# Patient Record
Sex: Female | Born: 1980
Health system: Southern US, Community
[De-identification: ages and names within clinical notes are randomized; demographics above are authoritative.]

## PROBLEM LIST (undated history)

## (undated) DIAGNOSIS — D649 Anemia, unspecified: Secondary | ICD-10-CM

## (undated) DIAGNOSIS — K9041 Non-celiac gluten sensitivity: Secondary | ICD-10-CM

## (undated) DIAGNOSIS — B009 Herpesviral infection, unspecified: Secondary | ICD-10-CM

## (undated) DIAGNOSIS — F319 Bipolar disorder, unspecified: Secondary | ICD-10-CM

## (undated) DIAGNOSIS — L209 Atopic dermatitis, unspecified: Secondary | ICD-10-CM

## (undated) DIAGNOSIS — Z91018 Allergy to other foods: Secondary | ICD-10-CM

## (undated) DIAGNOSIS — J309 Allergic rhinitis, unspecified: Secondary | ICD-10-CM

## (undated) DIAGNOSIS — F419 Anxiety disorder, unspecified: Secondary | ICD-10-CM

## (undated) DIAGNOSIS — J45909 Unspecified asthma, uncomplicated: Secondary | ICD-10-CM

## (undated) DIAGNOSIS — L309 Dermatitis, unspecified: Secondary | ICD-10-CM

## (undated) DIAGNOSIS — N289 Disorder of kidney and ureter, unspecified: Secondary | ICD-10-CM

## (undated) HISTORY — DX: Unspecified asthma, uncomplicated: J45.909

## (undated) HISTORY — DX: Allergic rhinitis, unspecified: J30.9

## (undated) HISTORY — DX: Atopic dermatitis, unspecified: L20.9

## (undated) HISTORY — DX: Allergy to other foods: Z91.018

## (undated) HISTORY — DX: Non-celiac gluten sensitivity: K90.41

## (undated) HISTORY — DX: Dermatitis, unspecified: L30.9

## (undated) HISTORY — DX: Herpesviral infection, unspecified: B00.9

## (undated) HISTORY — DX: Anemia, unspecified: D64.9

## (undated) HISTORY — PX: WISDOM TOOTH EXTRACTION: SHX21

---
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
## 2000-05-03 ENCOUNTER — Ambulatory Visit (HOSPITAL_COMMUNITY): Admission: RE | Admit: 2000-05-03 | Discharge: 2000-05-03 | Payer: Self-pay | Admitting: Obstetrics

## 2000-07-20 ENCOUNTER — Inpatient Hospital Stay (HOSPITAL_COMMUNITY): Admission: AD | Admit: 2000-07-20 | Discharge: 2000-07-20 | Payer: Self-pay | Admitting: *Deleted

## 2000-09-14 ENCOUNTER — Inpatient Hospital Stay (HOSPITAL_COMMUNITY): Admission: AD | Admit: 2000-09-14 | Discharge: 2000-09-16 | Payer: Self-pay | Admitting: *Deleted

## 2000-09-23 ENCOUNTER — Inpatient Hospital Stay (HOSPITAL_COMMUNITY): Admission: AD | Admit: 2000-09-23 | Discharge: 2000-09-24 | Payer: Self-pay | Admitting: *Deleted

## 2000-10-29 ENCOUNTER — Other Ambulatory Visit: Admission: RE | Admit: 2000-10-29 | Discharge: 2000-10-29 | Payer: Self-pay | Admitting: *Deleted

## 2001-07-24 ENCOUNTER — Encounter: Admission: RE | Admit: 2001-07-24 | Discharge: 2001-07-24 | Payer: Self-pay | Admitting: Family Medicine

## 2001-07-24 ENCOUNTER — Encounter: Payer: Self-pay | Admitting: Family Medicine

## 2001-11-20 ENCOUNTER — Other Ambulatory Visit: Admission: RE | Admit: 2001-11-20 | Discharge: 2001-11-20 | Payer: Self-pay | Admitting: Family Medicine

## 2002-08-22 ENCOUNTER — Emergency Department (HOSPITAL_COMMUNITY): Admission: EM | Admit: 2002-08-22 | Discharge: 2002-08-22 | Payer: Self-pay | Admitting: Emergency Medicine

## 2003-01-05 ENCOUNTER — Encounter: Payer: Self-pay | Admitting: Family Medicine

## 2003-01-05 ENCOUNTER — Encounter: Admission: RE | Admit: 2003-01-05 | Discharge: 2003-01-05 | Payer: Self-pay | Admitting: Family Medicine

## 2003-01-22 ENCOUNTER — Other Ambulatory Visit: Admission: RE | Admit: 2003-01-22 | Discharge: 2003-01-22 | Payer: Self-pay | Admitting: Family Medicine

## 2003-01-27 ENCOUNTER — Emergency Department (HOSPITAL_COMMUNITY): Admission: EM | Admit: 2003-01-27 | Discharge: 2003-01-28 | Payer: Self-pay | Admitting: Emergency Medicine

## 2003-03-02 ENCOUNTER — Ambulatory Visit (HOSPITAL_COMMUNITY): Admission: RE | Admit: 2003-03-02 | Discharge: 2003-03-02 | Payer: Self-pay | Admitting: Internal Medicine

## 2004-02-13 ENCOUNTER — Emergency Department (HOSPITAL_COMMUNITY): Admission: EM | Admit: 2004-02-13 | Discharge: 2004-02-13 | Payer: Self-pay | Admitting: Emergency Medicine

## 2004-06-15 ENCOUNTER — Emergency Department (HOSPITAL_COMMUNITY): Admission: EM | Admit: 2004-06-15 | Discharge: 2004-06-15 | Payer: Self-pay | Admitting: Emergency Medicine

## 2004-10-04 ENCOUNTER — Encounter: Admission: RE | Admit: 2004-10-04 | Discharge: 2004-10-04 | Payer: Self-pay | Admitting: Internal Medicine

## 2005-02-17 ENCOUNTER — Inpatient Hospital Stay (HOSPITAL_COMMUNITY): Admission: RE | Admit: 2005-02-17 | Discharge: 2005-02-23 | Payer: Self-pay | Admitting: Psychiatry

## 2005-02-18 ENCOUNTER — Ambulatory Visit: Payer: Self-pay | Admitting: Psychiatry

## 2005-03-21 ENCOUNTER — Emergency Department (HOSPITAL_COMMUNITY): Admission: EM | Admit: 2005-03-21 | Discharge: 2005-03-21 | Payer: Self-pay | Admitting: Emergency Medicine

## 2005-05-29 ENCOUNTER — Emergency Department (HOSPITAL_COMMUNITY): Admission: EM | Admit: 2005-05-29 | Discharge: 2005-05-29 | Payer: Self-pay | Admitting: Family Medicine

## 2005-07-19 ENCOUNTER — Emergency Department (HOSPITAL_COMMUNITY): Admission: EM | Admit: 2005-07-19 | Discharge: 2005-07-19 | Payer: Self-pay | Admitting: Emergency Medicine

## 2005-08-22 ENCOUNTER — Emergency Department (HOSPITAL_COMMUNITY): Admission: EM | Admit: 2005-08-22 | Discharge: 2005-08-22 | Payer: Self-pay | Admitting: Family Medicine

## 2006-10-28 ENCOUNTER — Emergency Department (HOSPITAL_COMMUNITY): Admission: EM | Admit: 2006-10-28 | Discharge: 2006-10-28 | Payer: Self-pay | Admitting: Emergency Medicine

## 2007-03-01 ENCOUNTER — Emergency Department (HOSPITAL_COMMUNITY): Admission: EM | Admit: 2007-03-01 | Discharge: 2007-03-01 | Payer: Self-pay | Admitting: Emergency Medicine

## 2007-04-17 ENCOUNTER — Emergency Department (HOSPITAL_COMMUNITY): Admission: EM | Admit: 2007-04-17 | Discharge: 2007-04-17 | Payer: Self-pay | Admitting: Family Medicine

## 2007-05-01 ENCOUNTER — Emergency Department (HOSPITAL_COMMUNITY): Admission: EM | Admit: 2007-05-01 | Discharge: 2007-05-01 | Payer: Self-pay | Admitting: Family Medicine

## 2008-01-02 ENCOUNTER — Emergency Department (HOSPITAL_COMMUNITY): Admission: EM | Admit: 2008-01-02 | Discharge: 2008-01-02 | Payer: Self-pay | Admitting: Family Medicine
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
## 2010-12-09 LAB — POCT RAPID STREP A: Streptococcus, Group A Screen (Direct): POSITIVE — AB

## 2011-12-12 ENCOUNTER — Encounter: Payer: 59 | Admitting: Obstetrics and Gynecology

## 2011-12-13 ENCOUNTER — Ambulatory Visit (INDEPENDENT_AMBULATORY_CARE_PROVIDER_SITE_OTHER): Payer: 59 | Admitting: Obstetrics and Gynecology

## 2011-12-13 ENCOUNTER — Encounter: Payer: Self-pay | Admitting: Obstetrics and Gynecology

## 2011-12-13 VITALS — BP 118/72 | HR 82 | Ht 66.5 in | Wt 161.0 lb

## 2011-12-13 DIAGNOSIS — Z113 Encounter for screening for infections with a predominantly sexual mode of transmission: Secondary | ICD-10-CM

## 2011-12-13 DIAGNOSIS — Z124 Encounter for screening for malignant neoplasm of cervix: Secondary | ICD-10-CM

## 2011-12-13 DIAGNOSIS — N9489 Other specified conditions associated with female genital organs and menstrual cycle: Secondary | ICD-10-CM

## 2011-12-13 DIAGNOSIS — N9089 Other specified noninflammatory disorders of vulva and perineum: Secondary | ICD-10-CM

## 2011-12-13 DIAGNOSIS — Z01419 Encounter for gynecological examination (general) (routine) without abnormal findings: Secondary | ICD-10-CM

## 2011-12-13 MED ORDER — VALACYCLOVIR HCL 500 MG PO TABS: ORAL_TABLET | ORAL | Status: DC

## 2011-12-13 MED ORDER — IMIQUIMOD 5 % EX CREA: TOPICAL_CREAM | CUTANEOUS | Status: DC

## 2011-12-13 NOTE — Progress Notes (Signed)
Regular Periods: yes Mammogram: no  Monthly Breast Ex.: yes Exercise: yes  Tetanus < 10 years: no Seatbelts: yes  NI. Bladder Functn.: yes Abuse at home: no  Daily BM's: yes Stressful Work: yes  Healthy Diet: yes Sigmoid-Colonoscopy: NO  Calcium: no Medical problems this year: NO PROBLEMS   LAST PAP:2012  NL  Contraception: MIRENA IUD  Mammogram:  NO  PCP: NO  PMH: NO CHANGE  FMH: NO CHANGE  Last Bone Scan: NO    PT IS SINGLE.

## 2011-12-13 NOTE — Progress Notes (Signed)
Subjective:    Candace Browning is a 31 y.o. female, G1P0, who presents for an annual exam. The patient has not complaints.  Had Mirena IUD inserted July 2012.  Menstrual cycle:   LMP: Patient's last menstrual period was 11/27/2011.             Review of Systems Pertinent items are noted in HPI. Denies pelvic pain, urinary tract symptoms, vaginitis symptoms, irregular bleeding, menopausal symptoms, change in bowel habits or rectal bleeding   Objective:    BP 118/72  Pulse 82  Ht 5' 6.5" (1.689 m)  Wt 161 lb (73.029 kg)  BMI 25.60 kg/m2  LMP 11/27/2011    Wt Readings from Last 1 Encounters:  12/13/11 161 lb (73.029 kg)   Body mass index is 25.60 kg/(m^2). General Appearance: Alert, no acute distress HEENT: Grossly normal Neck / Thyroid: Supple, no thyromegaly or cervical adenopathy Lungs: Clear to auscultation bilaterally Back: No CVA tenderness Breast Exam: No masses or nodes.No dimpling, nipple retraction or discharge. Cardiovascular: Regular rate and rhythm.  Gastrointestinal: Soft, non-tender, no masses or organomegaly Pelvic Exam: EGBUS-wnl except at inferior vaginal introitus a linear condylomatous lesion, vagina-normal rugae, cervix- without lesions or tenderness, string visible uterus appears normal size shape and consistency, adnexae-no masses or tenderness Lymphatic Exam: Non-palpable nodes in neck, clavicular,  axillary, or inguinal regions  Skin: no rashes or abnormalities Extremities: no clubbing cyanosis or edema  Neurologic: grossly normal Psychiatric: Alert and oriented   Assessment:   Routine GYN Exam Asymptomatic Condylomatous Lesion   Plan:    PAP sent  STD testing  Offered biopsy of perineal lesion ? condyloma, patient prefers a trial of Aldara and if not gone or significantly decreased in 4 weeks will schedule a biopsy  Aldara Cream #4 apply as directed to affected area  qhs qod x3 weekly up to 12 weeks 3 refills  Reviewed HPV both high risk  and low risk RTO 1 year or prn  [name],ELMIRAPA-C

## 2011-12-13 NOTE — Patient Instructions (Signed)
Genital Warts Genital warts are a sexually transmitted infection. They may appear as small bumps on the tissues of the genital area. CAUSES  Genital warts are caused by a virus called human papillomavirus (HPV). HPV is the most common sexually transmitted disease (STD) and infection of the sex organs. This infection is spread by having unprotected sex with an infected person. It can be spread by vaginal, anal, and oral sex. Many people do not know they are infected. They may be infected for years without problems. However, even if they do not have problems, they can unknowingly pass the infection to their sexual partners. SYMPTOMS   Itching and irritation in the genital area.   Warts that bleed.   Painful sexual intercourse.  DIAGNOSIS  Warts are usually recognized with the naked eye on the vagina, vulva, perineum, anus, and rectum. Certain tests can also diagnose genital warts, such as:  A Pap test.   A tissue sample (biopsy) exam.   Colposcopy. A magnifying tool is used to examine the vagina and cervix. The HPV cells will change color when certain solutions are used.  TREATMENT  Warts can be removed by:  Applying certain chemicals, such as cantharidin or podophyllin.   Liquid nitrogen freezing (cryotherapy).   Immunotherapy with candida or trichophyton injections.   Laser treatment.   Burning with an electrified probe (electrocautery).   Interferon injections.   Surgery.  PREVENTION  HPV vaccination can help prevent HPV infections that cause genital warts and that cause cancer of the cervix. It is recommended that the vaccination be given to people between the ages 9 to 26 years old. The vaccine might not work as well or might not work at all if you already have HPV. It should not be given to pregnant women. HOME CARE INSTRUCTIONS   It is important to follow your caregiver's instructions. The warts will not go away without treatment. Repeat treatments are often needed to get  rid of warts. Even after it appears that the warts are gone, the normal tissue underneath often remains infected.   Do not try to treat genital warts with medicine used to treat hand warts. This type of medicine is strong and can burn the skin in the genital area, causing more damage.   Tell your past and current sexual partner(s) that you have genital warts. They may be infected also and need treatment.   Avoid sexual contact while being treated.   Do not touch or scratch the warts. The infection may spread to other parts of your body.   Women with genital warts should have a cervical cancer check (Pap test) at least once a year. This type of cancer is slow-growing and can be cured if found early. Chances of developing cervical cancer are increased with HPV.   Inform your obstetrician about your warts in the event of pregnancy. This virus can be passed to the baby's respiratory tract. Discuss this with your caregiver.   Use a condom during sexual intercourse. Following treatment, the use of condoms will help prevent reinfection.   Ask your caregiver about using over-the-counter anti-itch creams.  SEEK MEDICAL CARE IF:   Your treated skin becomes red, swollen, or painful.   You have a fever.   You feel generally ill.   You feel little lumps in and around your genital area.   You are bleeding or have painful sexual intercourse.  MAKE SURE YOU:   Understand these instructions.   Will watch your condition.   Will   get help right away if you are not doing well or get worse.  Document Released: 03/03/2000 Document Revised: 02/23/2011 Document Reviewed: 09/12/2010 [employer] Patient Information 2012 [employer]. 

## 2011-12-14 LAB — HEPATITIS C ANTIBODY: HCV Ab: NEGATIVE

## 2011-12-14 LAB — HEPATITIS B SURFACE ANTIGEN: Hepatitis B Surface Ag: NEGATIVE

## 2011-12-14 LAB — RPR

## 2011-12-14 LAB — HIV ANTIBODY (ROUTINE TESTING W REFLEX): HIV: NONREACTIVE

## 2011-12-18 LAB — PAP IG, CT-NG, RFX HPV ASCU

## 2011-12-19 LAB — HUMAN PAPILLOMAVIRUS, HIGH RISK: HPV DNA High Risk: NOT DETECTED

## 2011-12-20 ENCOUNTER — Encounter: Payer: Self-pay | Admitting: Obstetrics and Gynecology

## 2013-08-03 ENCOUNTER — Emergency Department (HOSPITAL_COMMUNITY)
Admission: EM | Admit: 2013-08-03 | Discharge: 2013-08-03 | Disposition: A | Discharge status: Home / Self Care | Payer: BC Managed Care – PPO | Attending: Emergency Medicine | Admitting: Emergency Medicine

## 2013-08-03 ENCOUNTER — Emergency Department (HOSPITAL_COMMUNITY): Payer: BC Managed Care – PPO

## 2013-08-03 ENCOUNTER — Encounter (HOSPITAL_COMMUNITY): Payer: Self-pay | Admitting: Emergency Medicine

## 2013-08-03 DIAGNOSIS — S40019A Contusion of unspecified shoulder, initial encounter: Secondary | ICD-10-CM

## 2013-08-03 DIAGNOSIS — S0990XA Unspecified injury of head, initial encounter: Secondary | ICD-10-CM | POA: Insufficient documentation

## 2013-08-03 DIAGNOSIS — Z872 Personal history of diseases of the skin and subcutaneous tissue: Secondary | ICD-10-CM | POA: Insufficient documentation

## 2013-08-03 DIAGNOSIS — Z8619 Personal history of other infectious and parasitic diseases: Secondary | ICD-10-CM | POA: Diagnosis not present

## 2013-08-03 DIAGNOSIS — S5000XA Contusion of unspecified elbow, initial encounter: Secondary | ICD-10-CM | POA: Diagnosis not present

## 2013-08-03 DIAGNOSIS — S0993XA Unspecified injury of face, initial encounter: Secondary | ICD-10-CM | POA: Diagnosis present

## 2013-08-03 DIAGNOSIS — IMO0002 Reserved for concepts with insufficient information to code with codable children: Secondary | ICD-10-CM | POA: Diagnosis not present

## 2013-08-03 DIAGNOSIS — S60219A Contusion of unspecified wrist, initial encounter: Secondary | ICD-10-CM | POA: Diagnosis not present

## 2013-08-03 DIAGNOSIS — Z23 Encounter for immunization: Secondary | ICD-10-CM | POA: Insufficient documentation

## 2013-08-03 DIAGNOSIS — Y92410 Unspecified street and highway as the place of occurrence of the external cause: Secondary | ICD-10-CM | POA: Diagnosis not present

## 2013-08-03 DIAGNOSIS — S058X9A Other injuries of unspecified eye and orbit, initial encounter: Secondary | ICD-10-CM | POA: Diagnosis not present

## 2013-08-03 DIAGNOSIS — S60211A Contusion of right wrist, initial encounter: Secondary | ICD-10-CM

## 2013-08-03 DIAGNOSIS — J45909 Unspecified asthma, uncomplicated: Secondary | ICD-10-CM | POA: Insufficient documentation

## 2013-08-03 DIAGNOSIS — S069X9A Unspecified intracranial injury with loss of consciousness of unspecified duration, initial encounter: Secondary | ICD-10-CM

## 2013-08-03 DIAGNOSIS — Y9389 Activity, other specified: Secondary | ICD-10-CM | POA: Diagnosis not present

## 2013-08-03 DIAGNOSIS — Z79899 Other long term (current) drug therapy: Secondary | ICD-10-CM | POA: Diagnosis not present

## 2013-08-03 DIAGNOSIS — S5001XA Contusion of right elbow, initial encounter: Secondary | ICD-10-CM

## 2013-08-03 DIAGNOSIS — S139XXA Sprain of joints and ligaments of unspecified parts of neck, initial encounter: Secondary | ICD-10-CM | POA: Insufficient documentation

## 2013-08-03 DIAGNOSIS — Z880 Allergy status to penicillin: Secondary | ICD-10-CM | POA: Diagnosis not present

## 2013-08-03 DIAGNOSIS — S161XXA Strain of muscle, fascia and tendon at neck level, initial encounter: Secondary | ICD-10-CM

## 2013-08-03 DIAGNOSIS — S199XXA Unspecified injury of neck, initial encounter: Secondary | ICD-10-CM | POA: Diagnosis present

## 2013-08-03 DIAGNOSIS — Z862 Personal history of diseases of the blood and blood-forming organs and certain disorders involving the immune mechanism: Secondary | ICD-10-CM | POA: Diagnosis not present

## 2013-08-03 DIAGNOSIS — S0501XA Injury of conjunctiva and corneal abrasion without foreign body, right eye, initial encounter: Secondary | ICD-10-CM

## 2013-08-03 DIAGNOSIS — S069XAA Unspecified intracranial injury with loss of consciousness status unknown, initial encounter: Secondary | ICD-10-CM

## 2013-08-03 MED ORDER — TETRACAINE HCL 0.5 % OP SOLN
1.0000 [drp] | Freq: Once | OPHTHALMIC | Status: AC
  Administered 2013-08-03: 1 [drp] via OPHTHALMIC
  Filled 2013-08-03: qty 2

## 2013-08-03 MED ORDER — TETANUS-DIPHTH-ACELL PERTUSSIS 5-2.5-18.5 LF-MCG/0.5 IM SUSP
0.5000 mL | Freq: Once | INTRAMUSCULAR | Status: AC
  Administered 2013-08-03: 0.5 mL via INTRAMUSCULAR
  Filled 2013-08-03: qty 0.5

## 2013-08-03 MED ORDER — MORPHINE SULFATE 4 MG/ML IJ SOLN
4.0000 mg | Freq: Once | INTRAMUSCULAR | Status: AC
  Administered 2013-08-03: 4 mg via INTRAVENOUS
  Filled 2013-08-03: qty 1

## 2013-08-03 MED ORDER — IBUPROFEN 800 MG PO TABS: 800.0000 mg | ORAL_TABLET | Freq: Three times a day (TID) | ORAL | Status: DC

## 2013-08-03 MED ORDER — GATIFLOXACIN 0.5 % OP SOLN
1.0000 [drp] | Freq: Four times a day (QID) | OPHTHALMIC | Status: DC
  Administered 2013-08-03: 1 [drp] via OPHTHALMIC
  Filled 2013-08-03: qty 2.5

## 2013-08-03 MED ORDER — OXYCODONE-ACETAMINOPHEN 5-325 MG PO TABS: 1.0000 | ORAL_TABLET | ORAL | Status: DC | PRN

## 2013-08-03 MED ORDER — CYCLOBENZAPRINE HCL 10 MG PO TABS: 10.0000 mg | ORAL_TABLET | Freq: Two times a day (BID) | ORAL | Status: DC | PRN

## 2013-08-03 MED ORDER — FLUORESCEIN SODIUM 1 MG OP STRP
1.0000 | ORAL_STRIP | Freq: Once | OPHTHALMIC | Status: AC
  Administered 2013-08-03: 1 via OPHTHALMIC
  Filled 2013-08-03: qty 1

## 2013-08-03 MED ORDER — ONDANSETRON HCL 4 MG/2ML IJ SOLN: 4.0000 mg | INTRAMUSCULAR | Status: DC | PRN

## 2013-08-03 NOTE — ED Notes (Signed)
Patient returned from X-ray 

## 2013-08-03 NOTE — ED Notes (Signed)
Patient transported to X-ray 

## 2013-08-03 NOTE — Progress Notes (Signed)
Orthopedic Tech Progress Note Patient Details:  Candace Browning 04-10-1980 [id number]  Ortho Devices Type of Ortho Device: Arm sling;Thumb velcro splint Ortho Device/Splint Location: RUE Ortho Device/Splint Interventions: Ordered   Braulio Bosch 08/03/2013, 3:55 PM

## 2013-08-03 NOTE — ED Notes (Signed)
Ortho tech paged  
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
## 2013-08-03 NOTE — Discharge Instructions (Signed)
Call optometrist tomorrow for further evaluation of your right eye redness, possible corneal abrasion, or foreign body. Do not wear your contacts until you are cleared to do so by an eye specialist. Call for a follow up appointment with a Family or Primary Care Provider.  Call an orthopedic specialist for further evaluation of your right elbow injury, right wrist injury, right shoulder injury. Continue to wear the thumb spica brace, and sling until you're released by orthopedic specialist. Return if Symptoms worsen.   Take medication as prescribed.  Ice your neck, shoulder, elbow, wrist 3-4 times a day.

## 2013-08-06 NOTE — ED Provider Notes (Signed)
Medical screening examination/treatment/procedure(s) were performed by non-physician practitioner and as supervising physician I was immediately available for consultation/collaboration.   EKG Interpretation None        Orpah Greek, MD 08/06/13 [phone]

## 2014-01-19 ENCOUNTER — Encounter (HOSPITAL_COMMUNITY): Payer: Self-pay | Admitting: Emergency Medicine

## 2014-11-27 ENCOUNTER — Ambulatory Visit: Payer: BLUE CROSS/BLUE SHIELD | Admitting: Podiatry

## 2014-12-15 ENCOUNTER — Ambulatory Visit (INDEPENDENT_AMBULATORY_CARE_PROVIDER_SITE_OTHER): Payer: BLUE CROSS/BLUE SHIELD

## 2014-12-15 DIAGNOSIS — J309 Allergic rhinitis, unspecified: Secondary | ICD-10-CM

## 2014-12-29 ENCOUNTER — Ambulatory Visit (INDEPENDENT_AMBULATORY_CARE_PROVIDER_SITE_OTHER): Payer: BLUE CROSS/BLUE SHIELD | Admitting: *Deleted

## 2014-12-29 DIAGNOSIS — J309 Allergic rhinitis, unspecified: Secondary | ICD-10-CM

## 2015-01-11 ENCOUNTER — Encounter: Payer: Self-pay | Admitting: Allergy and Immunology

## 2015-01-11 ENCOUNTER — Ambulatory Visit (INDEPENDENT_AMBULATORY_CARE_PROVIDER_SITE_OTHER): Payer: BLUE CROSS/BLUE SHIELD | Admitting: Allergy and Immunology

## 2015-01-11 VITALS — BP 104/70 | HR 80 | Temp 98.4°F | Resp 16 | Ht 67.0 in | Wt 130.0 lb

## 2015-01-11 DIAGNOSIS — T7800XA Anaphylactic reaction due to unspecified food, initial encounter: Secondary | ICD-10-CM | POA: Insufficient documentation

## 2015-01-11 DIAGNOSIS — J3089 Other allergic rhinitis: Secondary | ICD-10-CM | POA: Diagnosis not present

## 2015-01-11 DIAGNOSIS — L209 Atopic dermatitis, unspecified: Secondary | ICD-10-CM | POA: Diagnosis not present

## 2015-01-11 DIAGNOSIS — J4530 Mild persistent asthma, uncomplicated: Secondary | ICD-10-CM | POA: Diagnosis not present

## 2015-01-11 DIAGNOSIS — J4520 Mild intermittent asthma, uncomplicated: Secondary | ICD-10-CM | POA: Insufficient documentation

## 2015-01-11 DIAGNOSIS — T7800XD Anaphylactic reaction due to unspecified food, subsequent encounter: Secondary | ICD-10-CM

## 2015-01-11 NOTE — Assessment & Plan Note (Signed)
   Continue albuterol HFA, 1-2 inhalations every 4-6 hours as needed and 15 minutes prior to vigorous exercise.  Subjective and objective measures of pulmonary function will be followed and the treatment plan will be adjusted accordingly. 

## 2015-01-11 NOTE — Assessment & Plan Note (Signed)
   Continue meticulous avoidance of peanuts and tree nuts and have access to epinephrine autoinjector 2 pack.

## 2015-01-11 NOTE — Assessment & Plan Note (Addendum)
Improved.  Continue appropriate skin care, Aquaphor as needed, and Lidex sparingly to affected areas as needed.

## 2015-01-11 NOTE — Patient Instructions (Signed)
Mild persistent asthma  Continue albuterol HFA, 1-2 inhalations every 4-6 hours as needed and 15 minutes prior to vigorous exercise.  Subjective and objective measures of pulmonary function will be followed and the treatment plan will be adjusted accordingly.  Allergic rhinitis Stable.  Continue aeroallergen immunotherapy as prescribed, levocetirizine as needed and fluticasone nasal spray as needed.  Food allergy  Continue meticulous avoidance of peanuts and tree nuts and have access to epinephrine autoinjector 2 pack.  Atopic dermatitis Improved.  Continue appropriate skin care, Aquaphor as needed, and Lidex sparingly to affected areas as needed.   Return in about 5 months (around 06/11/2015), or if symptoms worsen or fail to improve.

## 2015-01-11 NOTE — Assessment & Plan Note (Signed)
Stable.  Continue aeroallergen immunotherapy as prescribed, levocetirizine as needed and fluticasone nasal spray as needed.

## 2015-01-11 NOTE — Progress Notes (Signed)
History of present illness: HPI Comments: Candace Browning is a 34 y.o. female with persistent asthma, allergic rhinoconjunctivitis, food allergy, and atopic dermatitis who presents today for follow up.  She reports that her upper and lower respiratory symptoms have been well-controlled in the interval since her previous visit in mid-July with the exception of some increased postnasal drainage during the fall allergy season. She has not required asthma rescue medication, experienced nocturnal awakenings due to lower respiratory symptoms, nor have activities of daily living been limited.  She is tolerating immunotherapy buildup without complications. She reports that she recently consumed potato chips made with peanut oil and had a minor eczema flare on her antecubital fossae.  Her eczema responds rapidly to Lidex and Aquaphor.      Assessment and plan: Mild persistent asthma  Continue albuterol HFA, 1-2 inhalations every 4-6 hours as needed and 15 minutes prior to vigorous exercise.  Subjective and objective measures of pulmonary function will be followed and the treatment plan will be adjusted accordingly.  Allergic rhinitis Stable.  Continue aeroallergen immunotherapy as prescribed, levocetirizine as needed and fluticasone nasal spray as needed.  Food allergy  Continue meticulous avoidance of peanuts and tree nuts and have access to epinephrine autoinjector 2 pack.  Atopic dermatitis Improved.  Continue appropriate skin care, Aquaphor as needed, and Lidex sparingly to affected areas as needed.   Diagnositics: Spirometry: normal. Please see scanned spirometry results.     Physical examination: Blood pressure 104/70, pulse 80, temperature 98.4 F (36.9 C), temperature source Oral, resp. rate 16, height 5\' 7"  (1.702 m), weight 130 lb (58.968 kg), last menstrual period 12/21/2014.  General: Alert, interactive, in no acute distress. HEENT: TMs pearly gray, turbinates minimally  edematous without discharge, post-pharynx mildly erythematous. Neck: Supple without lymphadenopathy. Lungs: Clear to auscultation without wheezing, rhonchi or rales. CV: Normal S1, S2 without murmurs. Skin: Warm and dry, with mild hyperpigmentation over the left antecubital fossa.  The following portions of the patient's history were reviewed and updated as appropriate: allergies, current medications, past family history, past medical history, past social history, past surgical history and problem list.  Outpatient medications:   Medication List       This list is accurate as of: 01/11/15  6:30 PM.  Always use your most recent med list.               albuterol 108 (90 BASE) MCG/ACT inhaler  Commonly known as:  PROVENTIL HFA;VENTOLIN HFA  Inhale into the lungs.     albuterol (2.5 MG/3ML) 0.083% nebulizer solution  Commonly known as:  PROVENTIL  Take 2.5 mg by nebulization every 6 (six) hours as needed for wheezing or shortness of breath.     BUDEPRION XL 300 MG 24 hr tablet  Generic drug:  buPROPion  Take 300 mg by mouth.     carbamazepine 100 MG chewable tablet  Commonly known as:  TEGRETOL  1 in am 2 at hs     EPINEPHrine 0.3 mg/0.3 mL Soaj injection  Commonly known as:  EPI-PEN  Inject 0.3 mg into the muscle.     fluocinonide ointment 0.05 %  Commonly known as:  LIDEX  Apply 1 application topically 2 (two) times daily.     fluticasone 50 MCG/ACT nasal spray  Commonly known as:  FLONASE  Place 2 sprays into both nostrils daily.     ibuprofen 800 MG tablet  Commonly known as:  ADVIL,MOTRIN  Take 1 tablet (800 mg total) by mouth 3 (three) times  daily. Take with food     KLOR-CON M10 10 MEQ tablet  Generic drug:  potassium chloride  Take by mouth.     levocetirizine 5 MG tablet  Commonly known as:  XYZAL  Take by mouth.     levonorgestrel 20 MCG/24HR IUD  Commonly known as:  MIRENA  1 each by Intrauterine route once.     pimecrolimus 1 % cream  Commonly  known as:  ELIDEL  Apply topically.     valACYclovir 500 MG tablet  Commonly known as:  VALTREX  Take 500 mg by mouth 2 (two) times daily.        Known medication allergies: Allergies  Allergen Reactions  . Apple Anaphylaxis  . Coconut Oil   . Gluten Meal     All breads  . Latex   . Other     PT HAD FOOD ALLERGIES:APPLES, WHEAT, AND PECAN NUTS  . Peanut-Containing Drug Products   . Sulfa Antibiotics   . Hydrogen Peroxide Itching and Rash  . Penicillins Rash  . Wheat Bran Rash    I appreciate the opportunity to take part in this Candace Browning's care. Please do not hesitate to contact me with questions.  Sincerely,   R. Edgar Frisk, MD

## 2015-01-21 ENCOUNTER — Ambulatory Visit (INDEPENDENT_AMBULATORY_CARE_PROVIDER_SITE_OTHER): Payer: BLUE CROSS/BLUE SHIELD | Admitting: *Deleted

## 2015-01-21 DIAGNOSIS — J309 Allergic rhinitis, unspecified: Secondary | ICD-10-CM | POA: Diagnosis not present

## 2015-01-26 ENCOUNTER — Ambulatory Visit (INDEPENDENT_AMBULATORY_CARE_PROVIDER_SITE_OTHER): Payer: BLUE CROSS/BLUE SHIELD | Admitting: *Deleted

## 2015-01-26 DIAGNOSIS — J309 Allergic rhinitis, unspecified: Secondary | ICD-10-CM

## 2015-02-16 ENCOUNTER — Ambulatory Visit (INDEPENDENT_AMBULATORY_CARE_PROVIDER_SITE_OTHER): Admitting: *Deleted

## 2015-02-16 DIAGNOSIS — J309 Allergic rhinitis, unspecified: Secondary | ICD-10-CM

## 2015-02-25 ENCOUNTER — Ambulatory Visit (INDEPENDENT_AMBULATORY_CARE_PROVIDER_SITE_OTHER): Admitting: *Deleted

## 2015-02-25 DIAGNOSIS — J309 Allergic rhinitis, unspecified: Secondary | ICD-10-CM

## 2015-03-01 ENCOUNTER — Ambulatory Visit (INDEPENDENT_AMBULATORY_CARE_PROVIDER_SITE_OTHER)

## 2015-03-01 DIAGNOSIS — J309 Allergic rhinitis, unspecified: Secondary | ICD-10-CM

## 2015-03-08 ENCOUNTER — Ambulatory Visit (INDEPENDENT_AMBULATORY_CARE_PROVIDER_SITE_OTHER)

## 2015-03-08 DIAGNOSIS — J309 Allergic rhinitis, unspecified: Secondary | ICD-10-CM

## 2015-03-30 ENCOUNTER — Ambulatory Visit (INDEPENDENT_AMBULATORY_CARE_PROVIDER_SITE_OTHER): Admitting: *Deleted

## 2015-03-30 DIAGNOSIS — J309 Allergic rhinitis, unspecified: Secondary | ICD-10-CM

## 2015-03-30 NOTE — Progress Notes (Signed)
Immunotherapy   Patient Details  Name: Candace Browning MRN: [id number] Date of Birth: [birth year]  03/30/2015  [name] will have vials sent to [facility] on Thursday due to her job and will continue to receive shots there.    Joellyn Rued 03/30/2015, 11:41 AM

## 2015-04-02 ENCOUNTER — Ambulatory Visit (INDEPENDENT_AMBULATORY_CARE_PROVIDER_SITE_OTHER)

## 2015-04-02 DIAGNOSIS — J309 Allergic rhinitis, unspecified: Secondary | ICD-10-CM

## 2015-04-15 ENCOUNTER — Ambulatory Visit (INDEPENDENT_AMBULATORY_CARE_PROVIDER_SITE_OTHER): Admitting: Neurology

## 2015-04-15 DIAGNOSIS — J309 Allergic rhinitis, unspecified: Secondary | ICD-10-CM | POA: Diagnosis not present

## 2015-04-28 ENCOUNTER — Ambulatory Visit (INDEPENDENT_AMBULATORY_CARE_PROVIDER_SITE_OTHER)

## 2015-04-28 DIAGNOSIS — J309 Allergic rhinitis, unspecified: Secondary | ICD-10-CM | POA: Diagnosis not present

## 2015-05-06 ENCOUNTER — Ambulatory Visit (INDEPENDENT_AMBULATORY_CARE_PROVIDER_SITE_OTHER)

## 2015-05-06 DIAGNOSIS — J309 Allergic rhinitis, unspecified: Secondary | ICD-10-CM | POA: Diagnosis not present

## 2015-05-14 ENCOUNTER — Encounter (HOSPITAL_COMMUNITY): Payer: Self-pay | Admitting: *Deleted

## 2015-05-14 ENCOUNTER — Inpatient Hospital Stay (HOSPITAL_COMMUNITY)
Admission: AD | Admit: 2015-05-14 | Discharge: 2015-05-18 | DRG: 885 | Disposition: A | Discharge status: Home / Self Care | Source: Intra-hospital | Attending: Psychiatry | Admitting: Psychiatry

## 2015-05-14 ENCOUNTER — Encounter (HOSPITAL_COMMUNITY): Payer: Self-pay

## 2015-05-14 ENCOUNTER — Emergency Department (HOSPITAL_COMMUNITY)
Admission: EM | Admit: 2015-05-14 | Discharge: 2015-05-14 | Disposition: A | Discharge status: Other Acute Inpatient Hospital | Attending: Emergency Medicine | Admitting: Emergency Medicine

## 2015-05-14 DIAGNOSIS — Z87448 Personal history of other diseases of urinary system: Secondary | ICD-10-CM | POA: Diagnosis not present

## 2015-05-14 DIAGNOSIS — F419 Anxiety disorder, unspecified: Secondary | ICD-10-CM | POA: Diagnosis not present

## 2015-05-14 DIAGNOSIS — Z825 Family history of asthma and other chronic lower respiratory diseases: Secondary | ICD-10-CM

## 2015-05-14 DIAGNOSIS — Z8261 Family history of arthritis: Secondary | ICD-10-CM | POA: Diagnosis not present

## 2015-05-14 DIAGNOSIS — Z7951 Long term (current) use of inhaled steroids: Secondary | ICD-10-CM | POA: Insufficient documentation

## 2015-05-14 DIAGNOSIS — Z91040 Latex allergy status: Secondary | ICD-10-CM | POA: Diagnosis not present

## 2015-05-14 DIAGNOSIS — Z833 Family history of diabetes mellitus: Secondary | ICD-10-CM | POA: Diagnosis not present

## 2015-05-14 DIAGNOSIS — F332 Major depressive disorder, recurrent severe without psychotic features: Secondary | ICD-10-CM | POA: Diagnosis present

## 2015-05-14 DIAGNOSIS — J4530 Mild persistent asthma, uncomplicated: Secondary | ICD-10-CM | POA: Diagnosis present

## 2015-05-14 DIAGNOSIS — F1310 Sedative, hypnotic or anxiolytic abuse, uncomplicated: Secondary | ICD-10-CM | POA: Diagnosis not present

## 2015-05-14 DIAGNOSIS — Z79899 Other long term (current) drug therapy: Secondary | ICD-10-CM | POA: Insufficient documentation

## 2015-05-14 DIAGNOSIS — Z880 Allergy status to penicillin: Secondary | ICD-10-CM | POA: Diagnosis not present

## 2015-05-14 DIAGNOSIS — R45851 Suicidal ideations: Secondary | ICD-10-CM

## 2015-05-14 DIAGNOSIS — R441 Visual hallucinations: Secondary | ICD-10-CM | POA: Diagnosis not present

## 2015-05-14 DIAGNOSIS — F411 Generalized anxiety disorder: Secondary | ICD-10-CM | POA: Diagnosis present

## 2015-05-14 DIAGNOSIS — Z8619 Personal history of other infectious and parasitic diseases: Secondary | ICD-10-CM | POA: Insufficient documentation

## 2015-05-14 DIAGNOSIS — G4700 Insomnia, unspecified: Secondary | ICD-10-CM | POA: Diagnosis present

## 2015-05-14 DIAGNOSIS — Z862 Personal history of diseases of the blood and blood-forming organs and certain disorders involving the immune mechanism: Secondary | ICD-10-CM | POA: Insufficient documentation

## 2015-05-14 DIAGNOSIS — Z872 Personal history of diseases of the skin and subcutaneous tissue: Secondary | ICD-10-CM | POA: Diagnosis not present

## 2015-05-14 DIAGNOSIS — J45909 Unspecified asthma, uncomplicated: Secondary | ICD-10-CM | POA: Diagnosis not present

## 2015-05-14 HISTORY — DX: Bipolar disorder, unspecified: F31.9

## 2015-05-14 HISTORY — DX: Anxiety disorder, unspecified: F41.9

## 2015-05-14 HISTORY — DX: Disorder of kidney and ureter, unspecified: N28.9

## 2015-05-14 LAB — COMPREHENSIVE METABOLIC PANEL
ALBUMIN: 4 g/dL (ref 3.5–5.0)
ALK PHOS: 62 U/L (ref 38–126)
ALT: 17 U/L (ref 14–54)
AST: 21 U/L (ref 15–41)
Anion gap: 9 (ref 5–15)
BILIRUBIN TOTAL: 1 mg/dL (ref 0.3–1.2)
BUN: 9 mg/dL (ref 6–20)
CALCIUM: 9 mg/dL (ref 8.9–10.3)
CO2: 24 mmol/L (ref 22–32)
CREATININE: 0.96 mg/dL (ref 0.44–1.00)
Chloride: 105 mmol/L (ref 101–111)
GFR calc Af Amer: >60 mL/min (ref >60)
GLUCOSE: 87 mg/dL (ref 65–99)
Potassium: 3.5 mmol/L (ref 3.5–5.1)
Sodium: 138 mmol/L (ref 135–145)
TOTAL PROTEIN: 7.7 g/dL (ref 6.5–8.1)

## 2015-05-14 LAB — RAPID URINE DRUG SCREEN, HOSP PERFORMED
AMPHETAMINES: NOT DETECTED
BARBITURATES: NOT DETECTED
Benzodiazepines: POSITIVE — AB
Cocaine: NOT DETECTED
OPIATES: NOT DETECTED
TETRAHYDROCANNABINOL: NOT DETECTED

## 2015-05-14 LAB — CBC
HCT: 33.2 % — ABNORMAL LOW (ref 36.0–46.0)
Hemoglobin: 11.4 g/dL — ABNORMAL LOW (ref 12.0–15.0)
MCH: 31 pg (ref 26.0–34.0)
MCHC: 34.3 g/dL (ref 30.0–36.0)
MCV: 90.2 fL (ref 78.0–100.0)
PLATELETS: 258 10*3/uL (ref 150–400)
RBC: 3.68 MIL/uL — AB (ref 3.87–5.11)
RDW: 14.1 % (ref 11.5–15.5)
WBC: 6.9 10*3/uL (ref 4.0–10.5)

## 2015-05-14 LAB — SALICYLATE LEVEL: Salicylate Lvl: <4 mg/dL (ref 2.8–30.0)

## 2015-05-14 LAB — ETHANOL

## 2015-05-14 LAB — ACETAMINOPHEN LEVEL: Acetaminophen (Tylenol), Serum: <10 ug/mL — ABNORMAL LOW (ref 10–30)

## 2015-05-14 MED ORDER — MAGNESIUM HYDROXIDE 400 MG/5ML PO SUSP: 30.0000 mL | Freq: Every day | ORAL | Status: DC | PRN

## 2015-05-14 MED ORDER — ACETAMINOPHEN 325 MG PO TABS: 650.0000 mg | ORAL_TABLET | Freq: Four times a day (QID) | ORAL | Status: DC | PRN

## 2015-05-14 MED ORDER — ALUM & MAG HYDROXIDE-SIMETH 200-200-20 MG/5ML PO SUSP: 30.0000 mL | ORAL | Status: DC | PRN

## 2015-05-14 NOTE — ED Provider Notes (Signed)
CSN: [id number]     Arrival date & time 05/14/15  1148 History   First MD Initiated Contact with Patient 05/14/15 1226     Chief Complaint  Patient presents with  . Suicidal     (Consider location/radiation/quality/duration/timing/severity/associated sxs/prior Treatment) HPI  Patient is a 35 year old female with past medical history of asthma, bipolar disorder and anxiety who presents the ED with complaint of suicide ideation. Patient reports she has been feeling suicidal for the past few months but notes it has worsened over the past few days. Endorses having a plan of either overdosing on her medications or cutting herself with knives. She notes her husband locks up her medications and is safe at home but reports she continues to have thoughts about overdosing on her medications with the intent of ending her life. She also reports that she thinks about cutting herself with knives and states one day she took all denies out of her house and threw them outside. Endorses visual hallucinations. She notes she sees a "brown dot" that moves around on the ground which she knows is not there but continues to see it daily. Patient also reports having thoughts of wanting to just or a things. She reports "I wanted here down that TV and throat on the ground or rip down the door." She notes she currently takes annex and [place] which was prescribed to her by her per provider at [facility]. Patient denies any other pain or complaints at this time. Denies HI. Endorses occasionally drinking 1 glass of wine at night. Denies drug use.  Past Medical History  Diagnosis Date  . Asthma   . Eczema   . Anemia   . Herpes simplex without mention of complication     HSV2  . Food allergy   . Allergic rhinitis   . Atopic dermatitis   . Bipolar 1 disorder ([place])   . Renal disorder   . Anxiety    Past Surgical History  Procedure Laterality Date  . Wisdom tooth extraction     Family History  Problem  Relation Age of Onset  . Diabetes Maternal Grandmother   . Arthritis Maternal Grandmother     KNEE REPLACEMENT  . Asthma Mother   . Anemia Mother   . Arthritis Mother     HAD KNEE REPLACEMENT   Social History  Substance Use Topics  . Smoking status: Never Smoker   . Smokeless tobacco: Never Used  . Alcohol Use: 2.4 oz/week    4 Glasses of wine per week   OB History    Gravida Para Term Preterm AB TAB SAB Ectopic Multiple Living   1         1     Review of Systems  Psychiatric/Behavioral: Positive for suicidal ideas (with plan) and hallucinations (visual).  All other systems reviewed and are negative.     Allergies  Apple; Coconut oil; Gluten meal; Latex; Other; Peanut-containing drug products; Sulfa antibiotics; Hydrogen peroxide; Penicillins; and Wheat bran  Home Medications   Prior to Admission medications   Medication Sig Start Date End Date Taking? Authorizing Provider  albuterol (PROVENTIL HFA;VENTOLIN HFA) 108 (90 BASE) MCG/ACT inhaler Inhale 2 puffs into the lungs every 4 (four) hours as needed for wheezing or shortness of breath.    Yes Historical Provider, MD  albuterol (PROVENTIL) (2.5 MG/3ML) 0.083% nebulizer solution Take 2.5 mg by nebulization every 6 (six) hours as needed for wheezing or shortness of breath. Reported on 05/14/2015   Yes Historical Provider,  MD  ALPRAZolam (XANAX) 0.25 MG tablet Take 0.25 mg by mouth 3 (three) times daily as needed for anxiety.  04/12/15  Yes Historical Provider, MD  Carbamazepine (EQUETRO) 100 MG CP12 12 hr capsule Take 100 mg by mouth daily.   Yes Historical Provider, MD  EPINEPHrine 0.3 mg/0.3 mL IJ SOAJ injection Inject 0.3 mg into the muscle. 08/24/14  Yes Historical Provider, MD  fluticasone (FLONASE) 50 MCG/ACT nasal spray Place 2 sprays into both nostrils daily as needed for allergies.    Yes Historical Provider, MD  levocetirizine (XYZAL) 5 MG tablet Take 5 mg by mouth every evening.  09/28/14  Yes Historical Provider, MD   lurasidone (LATUDA) 40 MG TABS tablet Take 40 mg by mouth daily.    Yes Historical Provider, MD  valACYclovir (VALTREX) 500 MG tablet Take 500 mg by mouth 2 (two) times daily as needed (outbreaks).    Yes Historical Provider, MD   BP 113/82 mmHg  Pulse 68  Temp(Src) 97.6 F (36.4 C) (Oral)  Resp 14  SpO2 100%  LMP 04/19/2015 Physical Exam  Constitutional: She is oriented to person, place, and time. She appears well-developed and well-nourished.  HENT:  Head: Normocephalic and atraumatic.  Mouth/Throat: Oropharynx is clear and moist. No oropharyngeal exudate.  Eyes: Conjunctivae and EOM are normal. Pupils are equal, round, and reactive to light. Right eye exhibits no discharge. Left eye exhibits no discharge. No scleral icterus.  Neck: Normal range of motion. Neck supple.  Cardiovascular: Normal rate, regular rhythm, normal heart sounds and intact distal pulses.   Pulmonary/Chest: Effort normal and breath sounds normal. No respiratory distress. She has no wheezes. She has no rales. She exhibits no tenderness.  Abdominal: Soft. Bowel sounds are normal. She exhibits no distension and no mass. There is no tenderness. There is no rebound and no guarding.  Musculoskeletal: Normal range of motion. She exhibits no edema.  Neurological: She is alert and oriented to person, place, and time.  Skin: Skin is warm and dry.  Psychiatric: Her speech is normal and behavior is normal. Her affect is blunt. She expresses suicidal ideation. She expresses suicidal plans.  Nursing note and vitals reviewed.   ED Course  Procedures (including critical care time) Labs Review Labs Reviewed  ACETAMINOPHEN LEVEL - Abnormal; Notable for the following:    Acetaminophen (Tylenol), Serum <10 (*)    All other components within normal limits  URINE RAPID DRUG SCREEN, HOSP PERFORMED - Abnormal; Notable for the following:    Benzodiazepines POSITIVE (*)    All other components within normal limits  CBC - Abnormal;  Notable for the following:    RBC 3.68 (*)    Hemoglobin 11.4 (*)    HCT 33.2 (*)    All other components within normal limits  COMPREHENSIVE METABOLIC PANEL  ETHANOL  SALICYLATE LEVEL    Imaging Review No results found. I have personally reviewed and evaluated these images and lab results as part of my medical decision-making.   EKG Interpretation None      MDM   Final diagnoses:  Suicidal ideation  Severe recurrent major depression without psychotic features ([place])    Pt presents with SI with plan. Pt medically cleared. Consulted TTS. Behavorial health recommends inpatient tx. Pt has placement at [facility] and will be transferred.    Chesley Noon [place], [place] 05/15/15 [place], MD 05/15/15 [phone]
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
## 2015-05-14 NOTE — ED Notes (Signed)
Attempted to call report, RN not available.  Notified [facility] for transport.  Transporter not available until after 4pm.

## 2015-05-14 NOTE — Tx Team (Signed)
Initial Interdisciplinary Treatment Plan   PATIENT STRESSORS: Financial difficulties Medication change or noncompliance   PATIENT STRENGTHS: Ability for insight Average or above average intelligence General fund of knowledge Physical Health Supportive family/friends Work skills   PROBLEM LIST: Problem List/Patient Goals Date to be addressed Date deferred Reason deferred Estimated date of resolution  anxiety 05/14/15     depression 05/14/15                                                DISCHARGE CRITERIA:  Ability to meet basic life and health needs Adequate post-discharge living arrangements Improved stabilization in mood, thinking, and/or behavior Motivation to continue treatment in a less acute level of care Need for constant or close observation no longer present Reduction of life-threatening or endangering symptoms to within safe limits Safe-care adequate arrangements made Verbal commitment to aftercare and medication compliance  PRELIMINARY DISCHARGE PLAN: Outpatient therapy Return to previous living arrangement  PATIENT/FAMIILY INVOLVEMENT: This treatment plan has been presented to and reviewed with the patient, Candace Browning, and/or family member, .  The patient and family have been given the opportunity to ask questions and make suggestions.  [name] 05/14/2015, 7:07 PM

## 2015-05-14 NOTE — ED Notes (Signed)
Per pt has had suicidal thoughts x 2 weeks.  Pt states worse since yesterday.  Pt states no plan.  No specific hallucinations but does see spots and wonders if it is real or in her eyes.  Previous hx of same.  Last treatment in October.  Changes in medications.  Pt has been seen by her NP for psych recently and started back on Latuda.

## 2015-05-14 NOTE — ED Notes (Signed)
Friend has patient's belongings.
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
## 2015-05-15 MED ORDER — TRAZODONE HCL 50 MG PO TABS
50.0000 mg | ORAL_TABLET | Freq: Every evening | ORAL | Status: DC | PRN
  Administered 2015-05-15 – 2015-05-16 (×2): 50 mg via ORAL
  Filled 2015-05-15 (×9): qty 1

## 2015-05-15 MED ORDER — HYDROXYZINE HCL 25 MG PO TABS
25.0000 mg | ORAL_TABLET | Freq: Three times a day (TID) | ORAL | Status: DC | PRN
  Administered 2015-05-17: 25 mg via ORAL
  Filled 2015-05-15: qty 1

## 2015-05-15 MED ORDER — LURASIDONE HCL 40 MG PO TABS
40.0000 mg | ORAL_TABLET | Freq: Every day | ORAL | Status: DC
  Administered 2015-05-15 – 2015-05-18 (×4): 40 mg via ORAL
  Filled 2015-05-15 (×7): qty 1

## 2015-05-15 MED ORDER — VALACYCLOVIR HCL 500 MG PO TABS
500.0000 mg | ORAL_TABLET | Freq: Two times a day (BID) | ORAL | Status: DC | PRN
  Filled 2015-05-15: qty 1

## 2015-05-15 MED ORDER — ALPRAZOLAM 0.25 MG PO TABS: 0.2500 mg | ORAL_TABLET | Freq: Three times a day (TID) | ORAL | Status: DC | PRN

## 2015-05-15 MED ORDER — CARBAMAZEPINE ER 100 MG PO CP12
100.0000 mg | ORAL_CAPSULE | Freq: Every day | ORAL | Status: DC
  Administered 2015-05-15 – 2015-05-17 (×3): 100 mg via ORAL
  Filled 2015-05-15 (×5): qty 1
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
## 2015-05-15 NOTE — BHH Group Notes (Signed)
[facility] LCSW Group Therapy  05/15/2015   11:00 AM   Type of Therapy:  Group Therapy  Participation Level:  Minimal  Participation Quality:  Minimal  Affect:  Flat, depressed  Cognitive:  Alert and Appropriate  Insight:  Limited, Lacking  Engagement in Therapy:  Limited, Lacking  Modes of Intervention:  Clarification, Confrontation, Discussion, Education, Exploration, Limit-setting, Orientation, Problem-solving, Rapport Building, Art therapist, Socialization and Support  Summary of Progress/Problems: Today's group topic was avoiding self sabotage and enabling behaviors. Group members were asked to define self sabotage and enabling and provide examples. Group members were then asked to discuss unhealthy relationships and how to have positive healthy boundaries with those that enable. Group members were asked to process how communicating needs and establishing a plan to change the above identified behavior.  Pt declined to share on this topic but appeared to actively listen to group discussion.  Pt left group and did not return halfway through.    Regan Lemming, LCSW 05/15/2015 1:04 PM
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
## 2015-05-15 NOTE — Progress Notes (Addendum)
D: Pt isolative to room, observed reading in bed at intervals during safety checks. Minimal interactions noted with others based on needs. Attended scheduled groups. Cooperative with unit routines. Compliant with medications as scheduled. Denies SI, HI, AVH and pain this AM when assessed, verbally contracts for safety. Reported poor sleep last night. Rated her depression 7/10, hopelessness 8/10 and anxiety 7/10. Pt's goal for today is to "go to group meetings" which she did accomplished by "not sleeping all day" as stated on self inventory sheet.   A: Scheduled medications administered as per MD's orders with verbal education. Writer provided emotional support and availability to pt throughout this shift. Verbal encouragement offered towards treatment compliance including group attendance.Writer offered PRN Xenax this AM as per previous order for anxiety and pt refused, stating she rather take it tonight with her night medication to help her sleep.  Writer informed pt of new orders. Q 15 minutes checks maintained for safety as ordered without behavioral outburst or self harm activities to note thus far.  R:Pt receptive to care. Denies adverse drug reactions. Remains safe on and off unit. POC continues.

## 2015-05-15 NOTE — Progress Notes (Signed)
Adult Psychoeducational Group Note  Date:  05/15/2015 Time:  8:45 PM  Group Topic/Focus:  Wrap-Up Group:   The focus of this group is to help patients review their daily goal of treatment and discuss progress on daily workbooks.  Participation Level:  Active  Participation Quality:  Appropriate  Affect:  Appropriate  Cognitive:  Appropriate  Insight: Appropriate  Engagement in Group:  Engaged  Modes of Intervention:  Discussion  Additional Comments:  The patient expressed that she attended group.The patient also said that in group she learned to replace negative thoughts with positive thoughts.  Candace Browning 05/15/2015, 8:45 PM

## 2015-05-15 NOTE — Plan of Care (Signed)
Problem: Ineffective individual coping Goal: STG: Patient will remain free from self harm Outcome: Progressing Pt safe on Q 15 minutes checks without self injurious behavior to note thus far this shift.  Problem: Diagnosis: Increased Risk For Suicide Attempt Goal: STG-Patient Will Comply With Medication Regime Outcome: Progressing Pt compliant with medications as ordered when offered.

## 2015-05-15 NOTE — [facility] Group Notes (Signed)
[place] Group Notes:  (Nursing--Healthy Coping Skills)  Date:  05/15/2015  Time:  1330 Type of Therapy:  Nurse Education  Participation Level:  Active & minimal  Participation Quality:  Appropriate and Attentive  Affect:  Blunted  Cognitive:  Alert, Appropriate and Oriented  Insight:  Appropriate  Engagement in Group:  Engaged and Limited  Modes of Intervention:  Discussion, Education and Support  Summary of Progress/Problems:Pt attended scheduled group and participated.  [place] Police 05/15/2015,1330

## 2015-05-15 NOTE — Progress Notes (Signed)
D: Pt is alert and oriented x 4. Pt complained of moderate depression of 6 on a 0-10 depression scale. She states, "I feel I'm useless; I don't have a job, I can't keep a job." Pt also verbalizes moderate anxiety of 4 on a 0-10 anxiety scale. She states, "I get anxious whenever am depressed-just for the unknown." Pt who was tearful denies pain, SI/HI and AVH. Pt remained cooperative through the shift assessment A: Medications offered as prescribed.  Support, encouragement, and safe environment provided.  15-minute safety checks continue. R: Pt was med compliant.  Pt did not attend group. Safety checks continue.

## 2015-05-15 NOTE — Progress Notes (Addendum)
Patient has been up and active on the unit, attended group this evening and has voiced concern about her anxiety and insomnia. Writer informed her of medications available to aid with those mentioned and she reports that visteril does not work for her and is willing to try the trazodone. Patient currently denies having pain, -si/hi/a/v hall. Support and encouragement offered, safety maintained on unit, will continue to monitor.

## 2015-05-16 DIAGNOSIS — R45851 Suicidal ideations: Secondary | ICD-10-CM

## 2015-05-16 DIAGNOSIS — F332 Major depressive disorder, recurrent severe without psychotic features: Principal | ICD-10-CM

## 2015-05-16 LAB — LIPID PANEL
Cholesterol: 189 mg/dL (ref 0–200)
HDL: 61 mg/dL (ref >40)
LDL CALC: 120 mg/dL — AB (ref 0–99)
TRIGLYCERIDES: 42 mg/dL (ref <150)
Total CHOL/HDL Ratio: 3.1 RATIO
VLDL: 8 mg/dL (ref 0–40)

## 2015-05-16 LAB — TSH: TSH: 1.165 u[IU]/mL (ref 0.350–4.500)

## 2015-05-16 MED ORDER — BUSPIRONE HCL 5 MG PO TABS
5.0000 mg | ORAL_TABLET | Freq: Three times a day (TID) | ORAL | Status: DC
  Administered 2015-05-16 – 2015-05-18 (×7): 5 mg via ORAL
  Filled 2015-05-16 (×12): qty 1

## 2015-05-16 MED ORDER — LORAZEPAM 0.5 MG PO TABS
0.5000 mg | ORAL_TABLET | Freq: Two times a day (BID) | ORAL | Status: DC
  Administered 2015-05-16 – 2015-05-17 (×2): 0.5 mg via ORAL
  Filled 2015-05-16 (×2): qty 1

## 2015-05-16 MED ORDER — LORAZEPAM 0.5 MG PO TABS: 0.5000 mg | ORAL_TABLET | Freq: Two times a day (BID) | ORAL | Status: DC

## 2015-05-16 NOTE — Progress Notes (Signed)
D.  Pt pleasant on approach, no complaints voiced.  Pt was positive for evening wrap up group, interacting appropriately with peers on the unit.  Denies SI/HI/hallucinations at this time.  Hopeful for discharge tomorrow.  Pt's family member stated he will have to be notified on time of discharge so that he will have ample time to be here to pick Pt up.  He states he works long hours and it is often hard to get with boss.  A.  Support and encouragement offered.  Assured family member that Pt will not be discharged until a time is arranged with him to come to pick up Pt.  R.  Pt remains safe on the unit, will continue to monitor.

## 2015-05-16 NOTE — Progress Notes (Signed)
[facility] MD Progress Note  05/16/2015 11:51 AM Candace GRZESKOWIAK  MRN:  [id number] Subjective:  Patient report  "I am okay, I am having more anxiety than I expected to have."  Objective:Candace Browning is awake, alert and oriented X4 , found attending group session.  Denies suicidal or homicidal ideation. Denies auditory or visual hallucination and does not appear to be responding to internal stimuli. Patient interacts well with staff and others. Patient reports she is medication compliant without mediation side effects. Report learning new coping skills. States her depression 4/10."Patient reports increase in  her anxiety 10/10 and states she has been taken xanxan for the past 3 weeks. Patient is requesting to be discharge on Monday. Reports feeling better without suicidal thoughts. Reports good appetite other wise and resting well. Support, encouragement and reassurance was provided.   Principal Problem: Major depressive disorder, recurrent episode, severe ([place]) Diagnosis:   Patient Active Problem List   Diagnosis Date Noted  . Severe recurrent major depression without psychotic features ([place]) [F33.2] 05/14/2015  . Major depressive disorder, recurrent episode, severe ([place]) [F33.2] 05/14/2015  . Mild persistent asthma [J45.30] 01/11/2015  . Allergic rhinitis [J30.89] 01/11/2015  . Food allergy [T78.00XA] 01/11/2015  . Atopic dermatitis [L20.9] 01/11/2015   Total Time spent with patient: 30 minutes  Past Psychiatric History: See above  Past Medical History:  Past Medical History  Diagnosis Date  . Asthma   . Eczema   . Anemia   . Herpes simplex without mention of complication     HSV2  . Food allergy   . Allergic rhinitis   . Atopic dermatitis   . Bipolar 1 disorder ([place])   . Renal disorder   . Anxiety     Past Surgical History  Procedure Laterality Date  . Wisdom tooth extraction     Family History:  Family History  Problem Relation Age of Onset  . Diabetes Maternal  Grandmother   . Arthritis Maternal Grandmother     KNEE REPLACEMENT  . Asthma Mother   . Anemia Mother   . Arthritis Mother     HAD KNEE REPLACEMENT   Family Psychiatric  History: Reports family history of bipolar Social History:  History  Alcohol Use  . 2.4 oz/week  . 4 Glasses of wine per week     History  Drug Use No    Social History   Social History  . Marital Status: Single    Spouse Name: N/A  . Number of Children: N/A  . Years of Education: N/A   Social History Main Topics  . Smoking status: Never Smoker   . Smokeless tobacco: Never Used  . Alcohol Use: 2.4 oz/week    4 Glasses of wine per week  . Drug Use: No  . Sexual Activity: Yes    Birth Control/ Protection: IUD     Comment: MIRENA (placed July 2012)   Other Topics Concern  . None   Social History Narrative   Additional Social History:                         Sleep: Fair  Appetite:  Fair  Current Medications: Current Facility-Administered Medications  Medication Dose Route Frequency Provider Last Rate Last Dose  . acetaminophen (TYLENOL) tablet 650 mg  650 mg Oral Q6H PRN Patrecia Pour, NP      . alum & mag hydroxide-simeth (MAALOX/MYLANTA) 200-200-20 MG/5ML suspension 30 mL  30 mL Oral Q4H PRN Asa Saunas  Lord, NP      . busPIRone (BUSPAR) tablet 5 mg  5 mg Oral TID Derrill Center, NP      . Carbamazepine (EQUETRO) 12 hr capsule 100 mg  100 mg Oral Daily Derrill Center, NP   100 mg at 05/16/15 [id number]  . hydrOXYzine (ATARAX/VISTARIL) tablet 25 mg  25 mg Oral TID PRN Derrill Center, NP      . LORazepam (ATIVAN) tablet 0.5 mg  0.5 mg Oral BID Derrill Center, NP      . lurasidone (LATUDA) tablet 40 mg  40 mg Oral Daily Derrill Center, NP   40 mg at 05/15/15 1708  . magnesium hydroxide (MILK OF MAGNESIA) suspension 30 mL  30 mL Oral Daily PRN Patrecia Pour, NP      . traZODone (DESYREL) tablet 50 mg  50 mg Oral QHS,MR X 1 Derrill Center, NP   50 mg at 05/15/15 2106  . valACYclovir (VALTREX)  tablet 500 mg  500 mg Oral BID PRN Derrill Center, NP        Lab Results:  Results for orders placed or performed during the hospital encounter of 05/14/15 (from the past 48 hour(s))  TSH     Status: None   Collection Time: 05/16/15  6:30 AM  Result Value Ref Range   TSH 1.165 0.350 - 4.500 uIU/mL    Comment: Performed at [facility]  Lipid panel     Status: Abnormal   Collection Time: 05/16/15  6:30 AM  Result Value Ref Range   Cholesterol 189 0 - 200 mg/dL   Triglycerides 42 <150 mg/dL   HDL 61 >40 mg/dL   Total CHOL/HDL Ratio 3.1 RATIO   VLDL 8 0 - 40 mg/dL   LDL Cholesterol 120 (H) 0 - 99 mg/dL    Comment:        Total Cholesterol/HDL:CHD Risk Coronary Heart Disease Risk Table                     Men   Women  1/2 Average Risk   3.4   3.3  Average Risk       5.0   4.4  2 X Average Risk   9.6   7.1  3 X Average Risk  23.4   11.0        Use the calculated Patient Ratio above and the CHD Risk Table to determine the patient's CHD Risk.        ATP III CLASSIFICATION (LDL):  <100     mg/dL   Optimal  100-129  mg/dL   Near or Above                    Optimal  130-159  mg/dL   Borderline  160-189  mg/dL   High  >190     mg/dL   Very High Performed at [facility]     Blood Alcohol level:  Lab Results  Component Value Date   [facility] <5 05/14/2015    Physical Findings: AIMS: Facial and Oral Movements Muscles of Facial Expression: None, normal Lips and Perioral Area: None, normal Jaw: None, normal Tongue: None, normal,Extremity Movements Upper (arms, wrists, hands, fingers): None, normal Lower (legs, knees, ankles, toes): None, normal, Trunk Movements Neck, shoulders, hips: None, normal, Overall Severity Severity of abnormal movements (highest score from questions above): None, normal Incapacitation due to abnormal movements: None, normal Patient's awareness of abnormal movements (  rate only patient's report): No Awareness, Dental  Status Current problems with teeth and/or dentures?: No Does patient usually wear dentures?: No  CIWA:    COWS:     Musculoskeletal: Strength & Muscle Tone: within normal limits Gait & Station: normal Patient leans: N/A  Psychiatric Specialty Exam: Review of Systems  Psychiatric/Behavioral: Positive for depression. Negative for suicidal ideas and hallucinations. The patient is nervous/anxious and has insomnia.   All other systems reviewed and are negative.   Blood pressure 94/69, pulse 105, temperature 98.4 F (36.9 C), temperature source Oral, resp. rate 18, height 5\' 6"  (1.676 m), weight 59.421 kg (131 lb), last menstrual period 04/19/2015.Body mass index is 21.15 kg/(m^2).  General Appearance: Casual and Well Groomed  Eye Contact::  Good  Speech:  Clear and Coherent  Volume:  Normal  Mood:  Anxious and Depressed 4/10  Affect:  Congruent  Thought Process:  Intact and Logical  Orientation:  Full (Time, Place, and Person)  Thought Content:  Hallucinations: Visual None  Suicidal Thoughts:  Yes.  with intent/plan  Homicidal Thoughts:  No  Memory:  Immediate;   Fair Recent;   Fair Remote;   Fair  Judgement:  Intact  Insight:  Present  Psychomotor Activity:  Normal  Concentration:  Fair  Recall:  [employer] of Knowledge:Fair  Language: Good  Akathisia:  No  Handed:  Right  AIMS (if indicated):     Assets:  Communication Skills Desire for Improvement Resilience  ADL's:  Intact  Cognition: WNL  Sleep:  Number of Hours: 5.5    I agree with current treatment plan on 05/16/2015 Patient seen face-to-face for psychiatric evaluation follow-up, chart reviewed.Reviewed the information documented and agree with the treatment plan.  Treatment Plan Summary:  Daily contact with patient to assess and evaluate symptoms and progress in treatment and Medication management Continue with Carbamazepine 100 and Latuda 40 mg, Vistaril 25 mg PO TID PRN mood stabilization. Start Buspar 5  mg PO TID for anxiety Start Ativan 0.5mg  BID for 2 days for anxiety consider taper and increasing Buspar Continue with Trazodone 50 mg with 1x repeat dose for insomnia Will continue to monitor vitals ,medication compliance and treatment side effects while patient is here.  Reviewed labs Glucose 101 elevated ,BAL - , IUDS - positive benzodiazepines. CSW will start working on disposition.  Patient to participate in therapeutic milieu  Derrill Center, NP 05/16/2015, 11:51 AM

## 2015-05-16 NOTE — Progress Notes (Signed)
Adult Psychoeducational Group Note  Date:  05/16/2015 Time:  9:19 PM  Group Topic/Focus:  Wrap-Up Group:   The focus of this group is to help patients review their daily goal of treatment and discuss progress on daily workbooks.  Participation Level:  Active  Participation Quality:  Appropriate and Attentive  Affect:  Appropriate  Cognitive:  Appropriate  Insight: Appropriate and Good  Engagement in Group:  Engaged  Modes of Intervention:  Discussion  Additional Comments:  Pt rated her day a 8 out of 26. Pt is happy that we were able to accommodate her with gluten free snacks. Pt goal for tomorrow is to talk with the doctor about the test they ran on her today and get results. Pt also plans to discharge soon.    [name] 05/16/2015, 9:19 PM

## 2015-05-16 NOTE — Plan of Care (Signed)
Problem: Diagnosis: Increased Risk For Suicide Attempt Goal: STG-Patient Will Attend All Groups On The Unit Outcome: Progressing Patient attended evening group and participated.

## 2015-05-16 NOTE — Progress Notes (Signed)
Nutrition Brief Note  Patient identified on the Malnutrition Screening Tool (MST) Report  Wt Readings from Last 15 Encounters:  05/14/15 131 lb (59.421 kg)  01/11/15 130 lb (58.968 kg)  08/03/13 140 lb (63.504 kg)  12/13/11 161 lb (73.029 kg)    Body mass index is 21.15 kg/(m^2). Patient meets criteria for normal range based on current BMI.   Diet Order: Diet regular Room service appropriate?: Yes; Fluid consistency:: Thin Pt is also offered choice of unit snacks mid-morning and mid-afternoon.  Pt is eating as desired.    Labs and medications reviewed.   No nutrition interventions warranted at this time. If nutrition issues arise, please consult RD.   Clayton Bibles, MS, RD, LDN Pager: [phone] After Hours Pager: [phone]

## 2015-05-16 NOTE — BHH Group Notes (Signed)
05/16/2015  10:00 AM   Type of Therapy and Topic: Group Therapy: Developing Self Esteem and Improving Support System   Participation Level: Engaged well with group today. Willing to participate with support from facilitator.   Description of Group:   Participants chose group topic based on their identified challenges. Participants discussed challenges of poor self-esteem and asked questions to gain better understanding of the issue. Group discussion processed roots of self-esteem in order to identify a plan to change work on self-esteem.   Therapeutic Goals Addressed in Processing Group:               1)  Identify self-esteem and how it is influenced of others.             2)  Acknowledge the importance of being honest with yourself about feelings and self-worth.             3)  Identify supports that will affirm your esteem.             4)  Identify activities to acceptance of self.    Summary of Patient Progress:   Patient affirmed concepts in group but shared very little. Patient did reflect that she enjoyed the topic and found it helpful.   Christene Lye MSW, LCSW

## 2015-05-16 NOTE — Progress Notes (Signed)
D) Pt has been attending the groups and interacting with her peers. Affect and mood are appropriate. Pt denies SI and HI. States she is feeling much better overall. Rates her depression at a 4, hopelessness at a 3 and her anxiety at a 5. Pt states that she is feeling better with the anxiety medications. Verbalized concerns over not having snacks that were Gluten free for her to eat. A) Pt was allowed to have her husband bring some gluten free snacks to the hospital and they are being kept in the 400 hall med room. Pt given support, reassurance and praise. R) Denies SI and HI.

## 2015-05-17 LAB — HEMOGLOBIN A1C
Hgb A1c MFr Bld: 5.4 % (ref 4.8–5.6)
MEAN PLASMA GLUCOSE: 108 mg/dL

## 2015-05-17 LAB — PREGNANCY, URINE: PREG TEST UR: NEGATIVE

## 2015-05-17 MED ORDER — CARBAMAZEPINE ER 100 MG PO CP12
100.0000 mg | ORAL_CAPSULE | Freq: Two times a day (BID) | ORAL | Status: DC
  Administered 2015-05-18: 100 mg via ORAL
  Filled 2015-05-17 (×5): qty 1

## 2015-05-17 MED ORDER — LORAZEPAM 0.5 MG PO TABS: 0.5000 mg | ORAL_TABLET | Freq: Every day | ORAL | Status: DC

## 2015-05-17 MED ORDER — TRAZODONE HCL 50 MG PO TABS: 50.0000 mg | ORAL_TABLET | Freq: Every evening | ORAL | Status: DC | PRN

## 2015-05-17 NOTE — Progress Notes (Signed)
D: Patient is focused on going home today.  She states she feels she is ready.  She denies SI/HI/AVH.  Her only complaint this morning was the times of her medications.  Informed patient would speak with MD about time change on her latuda and tegretol.  Patient rates her depression and anxiety as a 4; hopelessness as a 2.  She is observed in the day room interacting well with her peers.  She is attending groups and participating in her treatment. A: Continue to monitor medication management and MD orders.  Safety checks completed every 15 minutes per protocol.  Offer support and encouragement as needed. R: Patient is receptive to staff; her behavior is appropriate.

## 2015-05-17 NOTE — Plan of Care (Signed)
Problem: Ineffective individual coping Goal: STG: Patient will remain free from self harm Outcome: Progressing Patient denies any suicidal thoughts.

## 2015-05-17 NOTE — Progress Notes (Signed)
Patient ID: Candace Browning, female   DOB: 26-Oct-1980, 35 y.o.   MRN: [id number] [facility] MD Progress Note  05/17/2015 4:21 PM Candace Browning  MRN:  [id number] Subjective:  Patient reports she is feeling better, is less depressed , less anxious, and is hopeful to discharge soon in order to see her husband and son. Denies medication side effects at this time.   Objective: Patient case discussed with treatment team and seen in rounds . Patient reports history of depression, and brief episodes of increased spending , decreased need for sleep. No full / distinct  episodes of mania endorsed . States her depression exacerbated due to unemployment and a recent job interview where she felt she performed poorly. At this time denies medication side effects. She is  Going to groups and behavior on unit is in good control. Labs; HgbA1C 5.4 , glucose level 108, TSH 1.165, Lipid panel unremarkable except for slightly elevated LDL  Principal Problem: Major depressive disorder, recurrent episode, severe ([place]) Diagnosis:   Patient Active Problem List   Diagnosis Date Noted  . Severe recurrent major depression without psychotic features ([place]) [F33.2] 05/14/2015  . Major depressive disorder, recurrent episode, severe ([place]) [F33.2] 05/14/2015  . Mild persistent asthma [J45.30] 01/11/2015  . Allergic rhinitis [J30.89] 01/11/2015  . Food allergy [T78.00XA] 01/11/2015  . Atopic dermatitis [L20.9] 01/11/2015   Total Time spent with patient: 20 minutes   Past Psychiatric History: See above  Past Medical History:  Past Medical History  Diagnosis Date  . Asthma   . Eczema   . Anemia   . Herpes simplex without mention of complication     HSV2  . Food allergy   . Allergic rhinitis   . Atopic dermatitis   . Bipolar 1 disorder ([place])   . Renal disorder   . Anxiety     Past Surgical History  Procedure Laterality Date  . Wisdom tooth extraction     Family History:  Family History  Problem Relation Age of  Onset  . Diabetes Maternal Grandmother   . Arthritis Maternal Grandmother     KNEE REPLACEMENT  . Asthma Mother   . Anemia Mother   . Arthritis Mother     HAD KNEE REPLACEMENT   Family Psychiatric  History: Reports family history of bipolar Social History:  History  Alcohol Use  . 2.4 oz/week  . 4 Glasses of wine per week     History  Drug Use No    Social History   Social History  . Marital Status: Single    Spouse Name: N/A  . Number of Children: N/A  . Years of Education: N/A   Social History Main Topics  . Smoking status: Never Smoker   . Smokeless tobacco: Never Used  . Alcohol Use: 2.4 oz/week    4 Glasses of wine per week  . Drug Use: No  . Sexual Activity: Yes    Birth Control/ Protection: IUD     Comment: MIRENA (placed July 2012)   Other Topics Concern  . None   Social History Narrative   Additional Social History:   Sleep:  Improved   Appetite:   Improved   Current Medications: Current Facility-Administered Medications  Medication Dose Route Frequency Provider Last Rate Last Dose  . acetaminophen (TYLENOL) tablet 650 mg  650 mg Oral Q6H PRN Patrecia Pour, NP      . alum & mag hydroxide-simeth (MAALOX/MYLANTA) 200-200-20 MG/5ML suspension 30 mL  30 mL Oral Q4H PRN  Patrecia Pour, NP      . busPIRone (BUSPAR) tablet 5 mg  5 mg Oral TID Derrill Center, NP   5 mg at 05/17/15 1210  . [START ON 05/18/2015] Carbamazepine (EQUETRO) 12 hr capsule 100 mg  100 mg Oral BID Myer Peer Cobos, MD      . hydrOXYzine (ATARAX/VISTARIL) tablet 25 mg  25 mg Oral TID PRN Derrill Center, NP   25 mg at 05/17/15 0941  . LORazepam (ATIVAN) tablet 0.5 mg  0.5 mg Oral BID Derrill Center, NP   0.5 mg at 05/17/15 0755  . lurasidone (LATUDA) tablet 40 mg  40 mg Oral Daily Derrill Center, NP   40 mg at 05/17/15 1210  . magnesium hydroxide (MILK OF MAGNESIA) suspension 30 mL  30 mL Oral Daily PRN Patrecia Pour, NP      . traZODone (DESYREL) tablet 50 mg  50 mg Oral QHS,MR X 1  Jenne Campus, MD   50 mg at 05/16/15 2215  . valACYclovir (VALTREX) tablet 500 mg  500 mg Oral BID PRN Derrill Center, NP        Lab Results:  Results for orders placed or performed during the hospital encounter of 05/14/15 (from the past 48 hour(s))  TSH     Status: None   Collection Time: 05/16/15  6:30 AM  Result Value Ref Range   TSH 1.165 0.350 - 4.500 uIU/mL    Comment: Performed at [facility]  Lipid panel     Status: Abnormal   Collection Time: 05/16/15  6:30 AM  Result Value Ref Range   Cholesterol 189 0 - 200 mg/dL   Triglycerides 42 <150 mg/dL   HDL 61 >40 mg/dL   Total CHOL/HDL Ratio 3.1 RATIO   VLDL 8 0 - 40 mg/dL   LDL Cholesterol 120 (H) 0 - 99 mg/dL    Comment:        Total Cholesterol/HDL:CHD Risk Coronary Heart Disease Risk Table                     Men   Women  1/2 Average Risk   3.4   3.3  Average Risk       5.0   4.4  2 X Average Risk   9.6   7.1  3 X Average Risk  23.4   11.0        Use the calculated Patient Ratio above and the CHD Risk Table to determine the patient's CHD Risk.        ATP III CLASSIFICATION (LDL):  <100     mg/dL   Optimal  100-129  mg/dL   Near or Above                    Optimal  130-159  mg/dL   Borderline  160-189  mg/dL   High  >190     mg/dL   Very High Performed at [facility]   Hemoglobin A1c     Status: None   Collection Time: 05/16/15  6:30 AM  Result Value Ref Range   Hgb A1c MFr Bld 5.4 4.8 - 5.6 %    Comment: (NOTE)         Pre-diabetes: 5.7 - 6.4         Diabetes: >6.4         Glycemic control for adults with diabetes: <7.0    Mean Plasma Glucose 108  mg/dL    Comment: (NOTE) Performed At: [facility] [place], [place] [id number] Lindon Romp MD [id number] Performed at [facility]     Blood Alcohol level:  Lab Results  Component Value Date   [facility] <5 05/14/2015    Physical Findings: AIMS: Facial and Oral  Movements Muscles of Facial Expression: None, normal Lips and Perioral Area: None, normal Jaw: None, normal Tongue: None, normal,Extremity Movements Upper (arms, wrists, hands, fingers): None, normal Lower (legs, knees, ankles, toes): None, normal, Trunk Movements Neck, shoulders, hips: None, normal, Overall Severity Severity of abnormal movements (highest score from questions above): None, normal Incapacitation due to abnormal movements: None, normal Patient's awareness of abnormal movements (rate only patient's report): No Awareness, Dental Status Current problems with teeth and/or dentures?: No Does patient usually wear dentures?: No  CIWA:    COWS:     Musculoskeletal: Strength & Muscle Tone: within normal limits Gait & Station: normal Patient leans: N/A  Psychiatric Specialty Exam: Review of Systems  Psychiatric/Behavioral: Positive for depression. Negative for suicidal ideas and hallucinations. The patient is nervous/anxious and has insomnia.   All other systems reviewed and are negative. no headache, no SOB, no chest pain   Blood pressure 100/62, pulse 102, temperature 98.3 F (36.8 C), temperature source Oral, resp. rate 16, height 5\' 6"  (1.676 m), weight 131 lb (59.421 kg), last menstrual period 04/19/2015.Body mass index is 21.15 kg/(m^2).  General Appearance: Fairly groomed   Engineer, water::  Good  Speech:  Clear and Coherent  Volume:  Normal  Mood:  Reports mood is improved compared to admission  Affect:   More reactive, smiles at  Times appropriately   Thought Process:   Linear   Orientation:  Full (Time, Place, and Person)  Thought Content: denies any hallucinations,  No delusions expressed, not internally preoccupied   Suicidal Thoughts:  Today denies any suicidal or self injurious ideations   Homicidal Thoughts:  No  Memory:   Recent and remote grossly intact   Judgement:  Improving   Insight:  Present  Psychomotor Activity:  Normal  Concentration:  Good   Recall:  Good  Fund of Knowledge:Good  Language: Good  Akathisia:  No  Handed:  Right  AIMS (if indicated):     Assets:  Communication Skills Desire for Improvement Resilience  ADL's:  Intact  Cognition: WNL  Sleep:  Number of Hours: 6.5   Assessment- patient reports improving mood and range of affect . Participative in milieu.  Denies medication side effects. Hoping for discharge soon.   Treatment Plan Summary:  Encourage ongoing group participation to work on coping skills and symptom reduction. Increase Carbamazepine to 100 mgrs BID for mood disorder- side effects, to include increasing its own metabolism, teratogenic effects , and importance of periodic blood work and serum levels to be monitored reviewed . Continue  Latuda 40 mgrs QDAY for mood disorder  Continue  Vistaril 25 mg  TID PRN  For anxiety as needed  Continue Buspar 5 mg PO TID for anxiety Decrease  Ativan  To  0.5mg  QHS for anxiety and insomnia  Continue with Trazodone 50 mg QHS PRN for insomnia as needed  Treatment team working on disposition planning  Neita Garnet, MD 05/17/2015, 4:21 PM

## 2015-05-17 NOTE — BHH Group Notes (Signed)
[place] LCSW Group Therapy 05/17/2015  1:15 pm  Type of Therapy: Group Therapy Participation Level: Minimal  Participation Quality: Minimal  Affect: Depressed and Flat; Disengaged  Cognitive: Alert and Oriented  Insight: Developing/Improving and Engaged  Engagement in Therapy: Developing/Improving and Engaged  Modes of Intervention: Clarification, Confrontation, Discussion, Education, Exploration,  Limit-setting, Orientation, Problem-solving, Rapport Building, Art therapist, Socialization and Support  Summary of Progress/Problems: Pt identified obstacles faced currently and processed barriers involved in overcoming these obstacles. Pt identified steps necessary for overcoming these obstacles and explored motivation (internal and external) for facing these difficulties head on. Pt further identified one area of concern in their lives and chose a goal to focus on for today. Patient did not participate in discussion despite CSW encouragement and left group early to speak with the doctor.   Tilden Fossa, LCSW Clinical Social Worker [facility] [phone]

## 2015-05-17 NOTE — Progress Notes (Signed)
Adult Psychoeducational Group Note  Date:  05/17/2015 Time:  8:15 PM  Group Topic/Focus:  Wrap-Up Group:   The focus of this group is to help patients review their daily goal of treatment and discuss progress on daily workbooks.  Participation Level:  Active  Participation Quality:  Appropriate  Affect:  Appropriate  Cognitive:  Appropriate  Insight: Appropriate  Engagement in Group:  Engaged  Modes of Intervention:  Discussion  Additional Comments:  Pt was pleasant during wrap-up group. Pt rated her overall day a 7 out of 10 because she was drowsy for most of the day and she found out that she might be getting discharged tomorrow. Pt reported that she achieved her goal for the day, which was to talk to her doctor.   Lincoln Brigham 05/17/2015, 8:38 PM

## 2015-05-17 NOTE — BHH Group Notes (Signed)
   [facility] LCSW Aftercare Discharge Planning Group Note  05/17/2015  8:45 AM   Participation Quality: Alert, Appropriate and Oriented  Mood/Affect: blunted  Depression Rating: 4  Anxiety Rating: 7  Thoughts of Suicide: Pt denies SI/HI  Will you contract for safety? Yes  Current AVH: Pt denies  Plan for Discharge/Comments: Pt attended discharge planning group and actively participated in group. CSW provided pt with today's workbook. Patient plans to return home to follow up with outpatient services at discharge. She reports feeling ready to go home but also expresses questions/concerns regarding her medications.  Transportation Means: Pt reports access to transportation  Supports: No supports mentioned at this time  Tilden Fossa, MSW, [employer] Clinical Social Worker [employer] [phone]

## 2015-05-18 MED ORDER — TRAZODONE HCL 50 MG PO TABS: 50.0000 mg | ORAL_TABLET | Freq: Every evening | ORAL | Status: DC | PRN

## 2015-05-18 MED ORDER — CARBAMAZEPINE ER 100 MG PO CP12: 100.0000 mg | ORAL_CAPSULE | Freq: Two times a day (BID) | ORAL | Status: DC

## 2015-05-18 MED ORDER — VALACYCLOVIR HCL 500 MG PO TABS: 500.0000 mg | ORAL_TABLET | Freq: Two times a day (BID) | ORAL | Status: AC | PRN

## 2015-05-18 MED ORDER — HYDROXYZINE HCL 25 MG PO TABS: 25.0000 mg | ORAL_TABLET | Freq: Three times a day (TID) | ORAL | Status: DC | PRN

## 2015-05-18 MED ORDER — BUSPIRONE HCL 5 MG PO TABS: 5.0000 mg | ORAL_TABLET | Freq: Three times a day (TID) | ORAL | Status: DC

## 2015-05-18 MED ORDER — LURASIDONE HCL 40 MG PO TABS: 40.0000 mg | ORAL_TABLET | Freq: Every day | ORAL | Status: DC

## 2015-05-18 NOTE — Progress Notes (Signed)
Discharge note:  Patient discharged home per MD order.  Patient received all personal belongings from locker and unit. Reviewed discharge instructions, along with follow up appointments with patient.  Reviewed medications and prescriptions with patient.  Patient indicated understanding.  She denies SI/HI/AVH.  Patient left ambulatory with her ride.

## 2015-05-18 NOTE — BHH Suicide Risk Assessment (Signed)
[place] INPATIENT:  Family/Significant Other Suicide Prevention Education  Suicide Prevention Education:  Education Completed; husband Danielle Rankin [phone] ,  (name of family member/significant other) has been identified by the patient as the family member/significant other with whom the patient will be residing, and identified as the person(s) who will aid the patient in the event of a mental health crisis (suicidal ideations/suicide attempt).  With written consent from the patient, the family member/significant other has been provided the following suicide prevention education, prior to the and/or following the discharge of the patient.  The suicide prevention education provided includes the following:  Suicide risk factors  Suicide prevention and interventions  National Suicide Hotline telephone number  [facility] assessment telephone number  [facility] Emergency Assistance [place] and/or Residential Mobile Crisis Unit telephone number  Request made of family/significant other to:  Remove weapons (e.g., guns, rifles, knives), all items previously/currently identified as safety concern.    Remove drugs/medications (over-the-counter, prescriptions, illicit drugs), all items previously/currently identified as a safety concern.  The family member/significant other verbalizes understanding of the suicide prevention education information provided.  The family member/significant other agrees to remove the items of safety concern listed above.  Candace Browning, Candace Browning 05/18/2015, 1:07 PM

## 2015-05-18 NOTE — Tx Team (Addendum)
Interdisciplinary Treatment Plan Update (Adult) Date: 05/18/2015    Time Reviewed: 9:30 AM  Progress in Treatment: Attending groups: Yes Participating in groups: Minimally Taking medication as prescribed: Yes Tolerating medication: Yes Family/Significant other contact made: Yes, CSW has spoken with husband Patient understands diagnosis: Yes Discussing patient identified problems/goals with staff: Yes Medical problems stabilized or resolved: Yes Denies suicidal/homicidal ideation: Yes Issues/concerns per patient self-inventory: Yes Other:  New problem(s) identified: N/A  Discharge Plan or Barriers: Patient will return home to follow up with outpatient services.  Reason for Continuation of Hospitalization:  Depression Anxiety Medication Stabilization   Comments: N/A  Estimated length of stay: Discharge anticipated for today 05/18/15    Patient is a 35 year old female with a diagnosis of Major Depressive Disorder. Patient presented to the hospital due to suicidal ideation. Patient reports primary trigger for admission was having a bad day by not doing well on a job interview. Patient will benefit from crisis stabilization, medication evaluation, group therapy and psycho education in addition to case management for discharge planning. At discharge, it is recommended that patient remain compliant with established discharge plan and continued treatment.    Review of initial/current patient goals per problem list:  1. Goal(s): Patient will participate in aftercare plan   Met: Yes   Target date: 3-5 days post admission date   As evidenced by: Patient will participate within aftercare plan AEB aftercare provider and housing plan at discharge being identified.  2/28: Goal met. Patient plans to return home to follow up with outpatient services.    2. Goal (s): Patient will exhibit decreased depressive symptoms and suicidal ideations.   Met: Adequate for discharge per  MD   Target date: 3-5 days post admission date   As evidenced by: Patient will utilize self rating of depression at 3 or below and demonstrate decreased signs of depression or be deemed stable for discharge by MD.  2/27: Patient rates depression at 4, denies SI.   3. Goal(s): Patient will demonstrate decreased signs and symptoms of anxiety.   Met: Adequate for discharge per MD   Target date: 3-5 days post admission date   As evidenced by: Patient will utilize self rating of anxiety at 3 or below and demonstrated decreased signs of anxiety, or be deemed stable for discharge by MD  2/27: Patient rates anxiety at 7 today.     Attendees:  Patient:    Family:    Physician: Dr. Parke Poisson, MD  05/18/2015 9:30 AM  Nursing: Lars Pinks, RN Case manager  05/18/2015 9:30 AM  Clinical Social Worker: Tilden Fossa, LCSW 05/18/2015 9:30 AM  Other:  05/18/2015 9:30 AM  Clinical: Marcene Duos, RN 05/18/2015 9:30 AM  Other: Larose Kells, NP 05/18/2015 9:30 AM  Other:         Scribe for Treatment Team:  Tilden Fossa, [place]

## 2015-05-18 NOTE — Discharge Summary (Signed)
Physician Discharge Summary Note  Patient:  Candace Browning is an 35 y.o., female MRN:  [id number] DOB:  Sep 20, 1980 Patient phone:  [phone] (home)  Patient address:   [address] [place] [id number],  Total Time spent with patient: 30 minutes  Date of Admission:  05/14/2015 Date of Discharge: 05/18/2015  Reason for Admission:  depression  Principal Problem: Major depressive disorder, recurrent episode, severe [facility]) Discharge Diagnoses: Patient Active Problem List   Diagnosis Date Noted  . Severe episode of recurrent major depressive disorder, without psychotic features ([place]) [F33.2]   . Severe recurrent major depression without psychotic features ([place]) [F33.2] 05/14/2015  . Major depressive disorder, recurrent episode, severe ([place]) [F33.2] 05/14/2015  . Mild persistent asthma [J45.30] 01/11/2015  . Allergic rhinitis [J30.89] 01/11/2015  . Food allergy [T78.00XA] 01/11/2015  . Atopic dermatitis [L20.9] 01/11/2015   Past Psychiatric History: see above noted  Past Medical History:  Past Medical History  Diagnosis Date  . Asthma   . Eczema   . Anemia   . Herpes simplex without mention of complication     HSV2  . Food allergy   . Allergic rhinitis   . Atopic dermatitis   . Bipolar 1 disorder ([place])   . Renal disorder   . Anxiety     Past Surgical History  Procedure Laterality Date  . Wisdom tooth extraction     Family History:  Family History  Problem Relation Age of Onset  . Diabetes Maternal Grandmother   . Arthritis Maternal Grandmother     KNEE REPLACEMENT  . Asthma Mother   . Anemia Mother   . Arthritis Mother     HAD KNEE REPLACEMENT   Family Psychiatric  History:  See above noted Social History:  History  Alcohol Use  . 2.4 oz/week  . 4 Glasses of wine per week     History  Drug Use No    Social History   Social History  . Marital Status: Single    Spouse Name: N/A  . Number of Children: N/A  . Years of Education: N/A   Social  History Main Topics  . Smoking status: Never Smoker   . Smokeless tobacco: Never Used  . Alcohol Use: 2.4 oz/week    4 Glasses of wine per week  . Drug Use: No  . Sexual Activity: Yes    Birth Control/ Protection: IUD     Comment: MIRENA (placed July 2012)   Other Topics Concern  . None   Social History Narrative    Hospital Course:  Candace Browning was admitted for Major depressive disorder, recurrent episode, severe ([place]) and crisis management.  She was treated with the following medications as listed below.  Candace Browning was discharged with current medication and was instructed on how to take medications as prescribed; (details listed below under Medication List).  Medical problems were identified and treated as needed.  Home medications were restarted as appropriate.  Improvement was monitored by observation and Candace Browning daily report of symptom reduction.  Emotional and mental status was monitored by daily self-inventory reports completed by Candace Browning and clinical staff.         Candace Browning was evaluated by the treatment team for stability and plans for continued recovery upon discharge.  Candace Browning motivation was an integral factor for scheduling further treatment.  Employment, transportation, bed availability, health status, family support, and any pending legal issues were also considered during her hospital stay.  She was offered further treatment options upon discharge including but not limited to Residential, Intensive Outpatient, and Outpatient treatment.  Candace Browning will follow up with the services as listed below under Follow Up Information.     Upon completion of this admission the Candace Browning was both mentally and medically stable for discharge denying suicidal/homicidal ideation, auditory/visual/tactile hallucinations, delusional thoughts and paranoia.     Physical Findings: AIMS: Facial and Oral Movements Muscles of Facial  Expression: None, normal Lips and Perioral Area: None, normal Jaw: None, normal Tongue: None, normal,Extremity Movements Upper (arms, wrists, hands, fingers): None, normal Lower (legs, knees, ankles, toes): None, normal, Trunk Movements Neck, shoulders, hips: None, normal, Overall Severity Severity of abnormal movements (highest score from questions above): None, normal Incapacitation due to abnormal movements: None, normal Patient's awareness of abnormal movements (rate only patient's report): No Awareness, Dental Status Current problems with teeth and/or dentures?: No Does patient usually wear dentures?: No  CIWA:    COWS:     Musculoskeletal: Strength & Muscle Tone: within normal limits Gait & Station: normal Patient leans: N/A  Psychiatric Specialty Exam:  SEE MD SRA Review of Systems  Psychiatric/Behavioral: Negative for depression, suicidal ideas, hallucinations and substance abuse. The patient is not nervous/anxious.   All other systems reviewed and are negative.   Blood pressure 100/67, pulse 92, temperature 98.4 F (36.9 C), temperature source Oral, resp. rate 16, height 5\' 6"  (1.676 m), weight 59.421 kg (131 lb), last menstrual period 04/19/2015.Body mass index is 21.15 kg/(m^2).  Have you used any form of tobacco in the last 30 days? (Cigarettes, Smokeless Tobacco, Cigars, and/or Pipes): No  Has this patient used any form of tobacco in the last 30 days? (Cigarettes, Smokeless Tobacco, Cigars, and/or Pipes) Yes, N/A  Blood Alcohol level:  Lab Results  Component Value Date   ETH <5 [id number]    Metabolic Disorder Labs:  Lab Results  Component Value Date   HGBA1C 5.4 05/16/2015   MPG 108 05/16/2015   No results found for: PROLACTIN Lab Results  Component Value Date   CHOL 189 05/16/2015   TRIG 42 05/16/2015   HDL 61 05/16/2015   CHOLHDL 3.1 05/16/2015   VLDL 8 05/16/2015   LDLCALC 120* 05/16/2015    See Psychiatric Specialty Exam and Suicide Risk  Assessment completed by Attending Physician prior to discharge.  Discharge destination:  Home  Is patient on multiple antipsychotic therapies at discharge:  No   Has Patient had three or more failed trials of antipsychotic monotherapy by history:  No  Recommended Plan for Multiple Antipsychotic Therapies: NA     Medication List    STOP taking these medications        albuterol (2.5 MG/3ML) 0.083% nebulizer solution  Commonly known as:  PROVENTIL     albuterol 108 (90 Base) MCG/ACT inhaler  Commonly known as:  PROVENTIL HFA;VENTOLIN HFA     ALPRAZolam 0.25 MG tablet  Commonly known as:  XANAX     EPINEPHrine 0.3 mg/0.3 mL Soaj injection  Commonly known as:  EPI-PEN     fluticasone 50 MCG/ACT nasal spray  Commonly known as:  FLONASE     levocetirizine 5 MG tablet  Commonly known as:  XYZAL      TAKE these medications      Indication   busPIRone 5 MG tablet  Commonly known as:  BUSPAR  Take 1 tablet (5 mg total) by mouth 3 (three) times daily.   Indication:  Depression  Carbamazepine 100 MG Cp12 12 hr capsule  Commonly known as:  EQUETRO  Take 1 capsule (100 mg total) by mouth 2 (two) times daily.   Indication:  mood stabilization     hydrOXYzine 25 MG tablet  Commonly known as:  ATARAX/VISTARIL  Take 1 tablet (25 mg total) by mouth 3 (three) times daily as needed for anxiety.   Indication:  Anxiety Neurosis     lurasidone 40 MG Tabs tablet  Commonly known as:  LATUDA  Take 1 tablet (40 mg total) by mouth daily.   Indication:  Depressive Phase of Manic-Depression     traZODone 50 MG tablet  Commonly known as:  DESYREL  Take 1 tablet (50 mg total) by mouth at bedtime as needed for sleep.   Indication:  Trouble Sleeping     valACYclovir 500 MG tablet  Commonly known as:  VALTREX  Take 1 tablet (500 mg total) by mouth 2 (two) times daily as needed (outbreaks).   Indication:  Genital Herpes, Herpes Simplex Infection           Follow-up Information     Follow up with [facility].   Why:  Therapy appt with Rosary Lively on Friday March 3rd at 4:30pm. Medication management appt with Donnal Moat, PA on Tuesday March 7th at 1:30pm. Call office if you need to reschedule.    Contact information:   [place],  [address] Phone: [phone] Fax: [phone]      Follow-up recommendations:  Activity:  as tol Diet:  as tol  Comments:  1.  Take all your medications as prescribed.              2.  Report any adverse side effects to outpatient provider.                       3.  Patient instructed to not use alcohol or illegal drugs while on prescription medicines.            4.  In the event of worsening symptoms, instructed patient to call 911, the crisis hotline or go to nearest emergency room for evaluation of symptoms.  Signed: Janett Labella, NP [facility] 05/18/2015, 3:16 PM  Patient seen, Suicide Assessment Completed.  Disposition Plan Reviewed

## 2015-05-18 NOTE — Progress Notes (Signed)
D: Patient is irritable today.  She spoke up several times during groups about not having a discharge plan.  She stated, "you said we start having a discharge plan from the time we are admitted.  I haven't talked to anyone about discharge."  Patient is focused on leaving today.  Explained to patient that a Education officer, museum is available to her and she will have to discuss discharge with MD.  She denies SI/HI/AVH.  She rates her depression as a 4; hopelessness as a 3; anxiety as a 6.  She denies any physical problems.  She is sleeping and eating well. A: Continue to monitor medication management and MD orders.  Safety checks completed every 15 minutes per protocol.  Offer support and encouragement as needed. R: Patient is receptive to staff; her mood remains irritable.

## 2015-05-18 NOTE — BHH Suicide Risk Assessment (Signed)
[facility] Discharge Suicide Risk Assessment   Principal Problem: Major depressive disorder, recurrent episode, severe ([place]) Discharge Diagnoses:  Patient Active Problem List   Diagnosis Date Noted  . Severe recurrent major depression without psychotic features ([place]) [F33.2] 05/14/2015  . Major depressive disorder, recurrent episode, severe ([place]) [F33.2] 05/14/2015  . Mild persistent asthma [J45.30] 01/11/2015  . Allergic rhinitis [J30.89] 01/11/2015  . Food allergy [T78.00XA] 01/11/2015  . Atopic dermatitis [L20.9] 01/11/2015    Total Time spent with patient: 30 minutes  Musculoskeletal: Strength & Muscle Tone: within normal limits Gait & Station: normal Patient leans: N/A  Psychiatric Specialty Exam: ROS   Blood pressure 100/67, pulse 92, temperature 98.4 F (36.9 C), temperature source Oral, resp. rate 16, height 5\' 6"  (1.676 m), weight 131 lb (59.421 kg), last menstrual period 04/19/2015.Body mass index is 21.15 kg/(m^2).  General Appearance: improved grooming   Eye Contact::  Good  Speech:  Normal Rate409  Volume:  Normal  Mood:  improving, states " my mood is definitiely better than it was "  Affect:  Appropriate and at this time not irritable or expansive, smiles at times , appropriately   Thought Process:  Linear  Orientation:  Full (Time, Place, and Person)  Thought Content:  denies hallucinations, no delusions , not internally preoccupied   Suicidal Thoughts:  No denies any suicidal ideations, denies any self injurious ideations, no homicidal or violent ideations   Homicidal Thoughts:  No  Memory:  recent and remote grossly intact   Judgement:  Other:  improved   Insight:  Present  Psychomotor Activity:  Normal  Concentration:  Good  Recall:  Good  Fund of Knowledge:Good  Language: Good  Akathisia:  No  Handed:  Right  AIMS (if indicated):     Assets:  Communication Skills Desire for Improvement Resilience  Sleep:  Number of Hours: 5.5  Cognition: WNL  ADL's:   Intact   Mental Status Per Nursing Assessment::   On Admission:  Suicidal ideation indicated by patient  Demographic Factors:  35 year old married female, one son, lives at home with son and husband   Loss Factors: Unemployment, after lost her job November 2016, difficulty finding another job   Historical Factors: Prior psychiatric admissions for depression , history of suicide attempts   Risk Reduction Factors:   Responsible for children under 61 years of age, Sense of responsibility to family, Living with another person, especially a relative, Positive social support and Positive coping skills or problem solving skills  Continued Clinical Symptoms:  At this time patient is alert and attentive, reports mood is much improved, denies significant depression, affect is more reactive, no thought disorder, no SI or HI, no psychotic symptoms, future oriented, stating she plans to continue looking for a job  Denies any medication side effects  Of note , pregnancy test negative. We reviewed medication side effects, to include teratogenic and other side effects of Carbamazepine , and potential for movement disorder and metabolic disturbances on Latuda.   Cognitive Features That Contribute To Risk:  No gross cognitive deficits noted upon discharge. Is alert , attentive, and oriented x 3   Suicide Risk:  Mild:  Suicidal ideation of limited frequency, intensity, duration, and specificity.  There are no identifiable plans, no associated intent, mild dysphoria and related symptoms, good self-control (both objective and subjective assessment), few other risk factors, and identifiable protective factors, including available and accessible social support.  Follow-up Information    Follow up with [facility].  Why:  Therapy appt with Rosary Lively on Friday March 3rd at 4:30pm. Medication management appt with Donnal Moat, PA on Tuesday March 7th at 1:30pm. Call office if you need to  reschedule.    Contact information:   [place], [place] [address] Phone: [phone] Fax: [phone]      Plan Of Care/Follow-up recommendations:  Activity:  as tolerated  Diet:  Regular- has food allergies  Tests:  NA Other:  see below  Patient is leaving unit in good spirits  Plans to return home  Follow up as above  Neita Garnet, MD 05/18/2015, 10:27 AM

## 2015-05-18 NOTE — Progress Notes (Signed)
  [facility] Adult Case Management Discharge Plan :  Will you be returning to the same living situation after discharge:  Yes,  patient will return home At discharge, do you have transportation home?: Yes,  husband to transport\ Do you have the ability to pay for your medications: Yes,  patient will be provided with prescriptions at discharge  Release of information consent forms completed and in the chart;  Patient's signature needed at discharge.  Patient to Follow up at: Follow-up Information    Follow up with [facility].   Why:  Therapy appt with Rosary Lively on Friday March 3rd at 4:30pm. Medication management appt with Donnal Moat, PA on Tuesday March 7th at 1:30pm. Call office if you need to reschedule.    Contact information:   [place], [place] [address] Phone: [phone] Fax: [phone]      Next level of care provider has access to [facility] and Suicide Prevention discussed: Yes,  with patient and husband  Have you used any form of tobacco in the last 30 days? (Cigarettes, Smokeless Tobacco, Cigars, and/or Pipes): No  Has patient been referred to the Quitline?: N/A patient is not a smoker  Patient has been referred for addiction treatment: N/A  Brandin Dilday, Casimiro Needle 05/18/2015, 1:09 PM

## 2015-05-18 NOTE — BHH Group Notes (Signed)
[facility] Group Notes:  (Nursing/MHT/Case Management/Adjunct)  Date:  05/18/2015  Time:  0900 am  Type of Therapy:  Psychoeducational Skills  Participation Level:  Active  Participation Quality:  Intrusive  Affect:  Angry  Cognitive:  Alert  Insight:  Improving  Engagement in Group:  Defensive  Modes of Intervention:  Support  Summary of Progress/Problems: Patient states she needs to speak with a Education officer, museum as she does not have a discharge plan in place.    Zipporah Plants 05/18/2015, 9:39 AM

## 2015-05-25 ENCOUNTER — Ambulatory Visit (INDEPENDENT_AMBULATORY_CARE_PROVIDER_SITE_OTHER)

## 2015-05-25 DIAGNOSIS — J309 Allergic rhinitis, unspecified: Secondary | ICD-10-CM | POA: Diagnosis not present

## 2015-05-28 ENCOUNTER — Ambulatory Visit (INDEPENDENT_AMBULATORY_CARE_PROVIDER_SITE_OTHER): Admitting: *Deleted

## 2015-05-28 DIAGNOSIS — J309 Allergic rhinitis, unspecified: Secondary | ICD-10-CM | POA: Diagnosis not present

## 2015-06-03 ENCOUNTER — Ambulatory Visit (INDEPENDENT_AMBULATORY_CARE_PROVIDER_SITE_OTHER)

## 2015-06-03 DIAGNOSIS — J309 Allergic rhinitis, unspecified: Secondary | ICD-10-CM | POA: Diagnosis not present

## 2015-06-14 ENCOUNTER — Ambulatory Visit: Admitting: Internal Medicine

## 2015-06-14 ENCOUNTER — Ambulatory Visit: Payer: BLUE CROSS/BLUE SHIELD | Admitting: Internal Medicine

## 2015-06-14 ENCOUNTER — Ambulatory Visit (INDEPENDENT_AMBULATORY_CARE_PROVIDER_SITE_OTHER)

## 2015-06-14 DIAGNOSIS — J309 Allergic rhinitis, unspecified: Secondary | ICD-10-CM | POA: Diagnosis not present

## 2015-06-15 ENCOUNTER — Ambulatory Visit: Admitting: Allergy and Immunology

## 2015-06-18 ENCOUNTER — Ambulatory Visit (INDEPENDENT_AMBULATORY_CARE_PROVIDER_SITE_OTHER): Admitting: *Deleted

## 2015-06-18 DIAGNOSIS — J309 Allergic rhinitis, unspecified: Secondary | ICD-10-CM | POA: Diagnosis not present

## 2015-06-21 ENCOUNTER — Ambulatory Visit (INDEPENDENT_AMBULATORY_CARE_PROVIDER_SITE_OTHER)

## 2015-06-21 DIAGNOSIS — J309 Allergic rhinitis, unspecified: Secondary | ICD-10-CM

## 2015-06-23 ENCOUNTER — Encounter: Payer: Self-pay | Admitting: Allergy and Immunology

## 2015-06-23 ENCOUNTER — Ambulatory Visit (INDEPENDENT_AMBULATORY_CARE_PROVIDER_SITE_OTHER): Admitting: Allergy and Immunology

## 2015-06-23 VITALS — BP 110/70 | HR 80 | Resp 16

## 2015-06-23 DIAGNOSIS — T7800XD Anaphylactic reaction due to unspecified food, subsequent encounter: Secondary | ICD-10-CM

## 2015-06-23 DIAGNOSIS — J4520 Mild intermittent asthma, uncomplicated: Secondary | ICD-10-CM | POA: Diagnosis not present

## 2015-06-23 DIAGNOSIS — L209 Atopic dermatitis, unspecified: Secondary | ICD-10-CM

## 2015-06-23 DIAGNOSIS — H1045 Other chronic allergic conjunctivitis: Secondary | ICD-10-CM | POA: Diagnosis not present

## 2015-06-23 DIAGNOSIS — H1010 Acute atopic conjunctivitis, unspecified eye: Secondary | ICD-10-CM

## 2015-06-23 DIAGNOSIS — J3089 Other allergic rhinitis: Secondary | ICD-10-CM

## 2015-06-23 MED ORDER — OLOPATADINE HCL 0.2 % OP SOLN: 1.0000 [drp] | Freq: Every day | OPHTHALMIC | Status: DC

## 2015-06-23 NOTE — Patient Instructions (Addendum)
Allergic rhinitis  Continue aeroallergen immunotherapy as prescribed, levocetirizine as needed and fluticasone nasal spray as needed.  Seasonal allergic conjunctivitis  Treatment plan as outlined above for allergic rhinitis.  A prescription has been provided for Pataday, one drop per eye daily as needed.  I have also recommended Natural Tears as needed.  Mild intermittent asthma  Continue albuterol HFA, 1-2 inhalations every 4-6 hours as needed and 15 minutes prior to vigorous exercise.  Subjective and objective measures of pulmonary function will be followed and the treatment plan will be adjusted accordingly.  Atopic dermatitis  Continue appropriate skin care measures, Aquaphor as needed, and Lidex sparingly to affected areas as needed.  Food allergy  Continue meticulous avoidance of peanuts and tree nuts and have access to epinephrine autoinjector 2 pack in case of accidental ingestion.    Return in about 6 months (around 12/23/2015), or if symptoms worsen or fail to improve.

## 2015-06-23 NOTE — Assessment & Plan Note (Signed)
   Continue aeroallergen immunotherapy as prescribed, levocetirizine as needed and fluticasone nasal spray as needed.

## 2015-06-23 NOTE — Assessment & Plan Note (Signed)
   Continue meticulous avoidance of peanuts and tree nuts and have access to epinephrine autoinjector 2 pack in case of accidental ingestion.

## 2015-06-23 NOTE — Assessment & Plan Note (Signed)
   Treatment plan as outlined above for allergic rhinitis.  A prescription has been provided for Pataday, one drop per eye daily as needed.  I have also recommended Natural Tears as needed.

## 2015-06-23 NOTE — Assessment & Plan Note (Signed)
   Continue albuterol HFA, 1-2 inhalations every 4-6 hours as needed and 15 minutes prior to vigorous exercise.  Subjective and objective measures of pulmonary function will be followed and the treatment plan will be adjusted accordingly. 

## 2015-06-23 NOTE — Progress Notes (Signed)
Follow-up Note  RE: Candace Browning MRN: [id number] DOB: [birth year] Date of Office Visit: 06/23/2015  Primary care provider: Bartholome Bill, MD Referring provider: Bartholome Bill, MD  History of present illness: HPI Comments: Candace Browning is a 35 y.o. female with persistent asthma, allergic rhinoconjunctivitis on immunotherapy buildup, food allergy, and atopic dermatitis who presents today for follow up.I last saw her in the clinic on 01/11/2015.  He complains of itchy, watery, irritated eyes since the onset of pollen season.  Her symptoms seem to worsen her contacts are in.  She is currently using Zaditor 2 times per day as well as levocetirizine 5 mg daily without adequate relief.  Her nasal symptoms are adequately controlled with fluticasone nasal spray daily.  Other than a few mild local reactions, she is tolerating aeroallergen immunotherapy buildup without complications.  In the interval since her previous visit, she has not required albuterol rescue for asthma symptoms and does not experience nocturnal awakenings due to lower respiratory symptoms.  She has no eczema related complaints today and avoids peanuts and tree nuts with access to epinephrine autoinjector 2 pack.   Assessment and plan: Allergic rhinitis  Continue aeroallergen immunotherapy as prescribed, levocetirizine as needed and fluticasone nasal spray as needed.  Seasonal allergic conjunctivitis  Treatment plan as outlined above for allergic rhinitis.  A prescription has been provided for Pataday, one drop per eye daily as needed.  I have also recommended Natural Tears as needed.  Mild intermittent asthma  Continue albuterol HFA, 1-2 inhalations every 4-6 hours as needed and 15 minutes prior to vigorous exercise.  Subjective and objective measures of pulmonary function will be followed and the treatment plan will be adjusted accordingly.  Atopic dermatitis  Continue appropriate skin care  measures, Aquaphor as needed, and Lidex sparingly to affected areas as needed.  Food allergy  Continue meticulous avoidance of peanuts and tree nuts and have access to epinephrine autoinjector 2 pack in case of accidental ingestion.    Meds ordered this encounter  Medications  . Olopatadine HCl (PATADAY) 0.2 % SOLN    Sig: Apply 1 drop to eye daily.    Dispense:  1 Bottle    Refill:  3    Diagnositics: Spirometry:  Normal with an FEV1 of 108% predicted.  Please see scanned spirometry results for details.    Physical examination: Blood pressure 110/70, pulse 80, resp. rate 16.  General: Alert, interactive, in no acute distress. HEENT: TMs pearly gray, turbinates mildly edematous without discharge, post-pharynx mildly erythematous. Mild conjunctival injection bilaterally. Neck: Supple without lymphadenopathy. Lungs: Clear to auscultation without wheezing, rhonchi or rales. CV: Normal S1, S2 without murmurs. Skin: Warm and dry, without lesions or rashes.  The following portions of the patient's history were reviewed and updated as appropriate: allergies, current medications, past family history, past medical history, past social history, past surgical history and problem list.    Medication List       This list is accurate as of: 06/23/15  5:14 PM.  Always use your most recent med list.               albuterol 108 (90 Base) MCG/ACT inhaler  Commonly known as:  PROVENTIL HFA;VENTOLIN HFA  Inhale 2 puffs into the lungs every 6 (six) hours as needed.     busPIRone 5 MG tablet  Commonly known as:  BUSPAR  Take 1 tablet (5 mg total) by mouth 3 (three) times daily.     Carbamazepine 100  MG Cp12 12 hr capsule  Commonly known as:  EQUETRO  Take 1 capsule (100 mg total) by mouth 2 (two) times daily.     FLONASE 50 MCG/ACT nasal spray  Generic drug:  fluticasone     levocetirizine 5 MG tablet  Commonly known as:  XYZAL  Take 5 mg by mouth every evening.     lurasidone 40  MG Tabs tablet  Commonly known as:  LATUDA  Take 1 tablet (40 mg total) by mouth daily.     Olopatadine HCl 0.2 % Soln  Commonly known as:  PATADAY  Apply 1 drop to eye daily.     valACYclovir 500 MG tablet  Commonly known as:  VALTREX  Take 1 tablet (500 mg total) by mouth 2 (two) times daily as needed (outbreaks).     ZADITOR 0.025 % ophthalmic solution  Generic drug:  ketotifen  1 drop 2 (two) times daily as needed.        Allergies  Allergen Reactions  . Apple Anaphylaxis  . Coconut Oil   . Gluten Meal     All breads  . Latex   . Nsaids Other (See Comments)    Thin basement membrane disease, advised to avoid NSAIDS  . Other     PT HAD FOOD ALLERGIES:APPLES, WHEAT, AND PECAN NUTS  . Peanut-Containing Drug Products   . Sulfa Antibiotics   . Hydrogen Peroxide Itching and Rash  . Penicillins Rash    Has patient had a PCN reaction causing immediate rash, facial/tongue/throat swelling, SOB or lightheadedness with hypotension: yes  Has patient had a PCN reaction causing severe rash involving mucus membranes or skin necrosis: no Has patient had a PCN reaction that required hospitalization: no  Has patient had a PCN reaction occurring within the last 10 years: no If all of the above answers are "NO", then may proceed with Cephalosporin use.   . Wheat Bran Rash   Review of systems: Constitutional: Negative for fever, chills and weight loss.  HENT: Negative for nosebleeds.   Eyes: Negative for blurred vision.  Positive for ocular pruritus. Respiratory: Negative for hemoptysis.   Cardiovascular: Negative for chest pain.  Gastrointestinal: Negative for diarrhea and constipation.  Genitourinary: Negative for dysuria.  Musculoskeletal: Negative for myalgias and joint pain.  Neurological: Negative for dizziness.  Endo/Heme/Allergies: Does not bruise/bleed easily.   Past Medical History  Diagnosis Date  . Asthma   . Eczema   . Anemia   . Herpes simplex without mention of  complication     HSV2  . Food allergy   . Allergic rhinitis   . Atopic dermatitis   . Bipolar 1 disorder ([place])   . Renal disorder   . Anxiety     Family History  Problem Relation Age of Onset  . Diabetes Maternal Grandmother   . Arthritis Maternal Grandmother     KNEE REPLACEMENT  . Asthma Mother   . Anemia Mother   . Arthritis Mother     HAD KNEE REPLACEMENT    Social History   Social History  . Marital Status: Single    Spouse Name: N/A  . Number of Children: N/A  . Years of Education: N/A   Occupational History  . Not on file.   Social History Main Topics  . Smoking status: Never Smoker   . Smokeless tobacco: Never Used  . Alcohol Use: 2.4 oz/week    4 Glasses of wine per week  . Drug Use: No  . Sexual Activity:  Yes    Birth Control/ Protection: IUD     Comment: MIRENA (placed July 2012)   Other Topics Concern  . Not on file   Social History Narrative    I appreciate the opportunity to take part in this Dajana's care. Please do not hesitate to contact me with questions.  Sincerely,   R. Edgar Frisk, MD

## 2015-06-23 NOTE — Assessment & Plan Note (Addendum)
   Continue appropriate skin care measures, Aquaphor as needed, and Lidex sparingly to affected areas as needed.

## 2015-06-24 ENCOUNTER — Ambulatory Visit (INDEPENDENT_AMBULATORY_CARE_PROVIDER_SITE_OTHER)

## 2015-06-24 DIAGNOSIS — J309 Allergic rhinitis, unspecified: Secondary | ICD-10-CM

## 2015-06-30 ENCOUNTER — Telehealth: Payer: Self-pay

## 2015-06-30 DIAGNOSIS — J309 Allergic rhinitis, unspecified: Secondary | ICD-10-CM

## 2015-06-30 NOTE — Progress Notes (Signed)
This encounter was created in error - please disregard.

## 2015-06-30 NOTE — Telephone Encounter (Signed)
Patient came in today to get injections.  Her blue vials were empty.  Advised I would need to speak to Dr. Verlin Fester regarding advancing her Weed to [place] considering she did not make it to 0.5.  She did not receive any shots and I told her I would call her today. Per Dr. Netta Cedars to advance weed vial to Gold at 0.025 and repeat the grass vial (per protocol).  Called patient and left message on voicemail to return my call.

## 2015-07-01 ENCOUNTER — Ambulatory Visit (INDEPENDENT_AMBULATORY_CARE_PROVIDER_SITE_OTHER)

## 2015-07-01 DIAGNOSIS — J309 Allergic rhinitis, unspecified: Secondary | ICD-10-CM

## 2015-07-08 ENCOUNTER — Other Ambulatory Visit: Payer: Self-pay

## 2015-07-08 MED ORDER — LEVOCETIRIZINE DIHYDROCHLORIDE 5 MG PO TABS: 5.0000 mg | ORAL_TABLET | Freq: Every evening | ORAL | Status: DC

## 2015-07-09 ENCOUNTER — Ambulatory Visit (INDEPENDENT_AMBULATORY_CARE_PROVIDER_SITE_OTHER)

## 2015-07-09 DIAGNOSIS — J309 Allergic rhinitis, unspecified: Secondary | ICD-10-CM

## 2015-07-14 ENCOUNTER — Ambulatory Visit (INDEPENDENT_AMBULATORY_CARE_PROVIDER_SITE_OTHER)

## 2015-07-14 DIAGNOSIS — J309 Allergic rhinitis, unspecified: Secondary | ICD-10-CM | POA: Diagnosis not present

## 2015-07-16 ENCOUNTER — Ambulatory Visit (INDEPENDENT_AMBULATORY_CARE_PROVIDER_SITE_OTHER): Admitting: Allergy and Immunology

## 2015-07-16 ENCOUNTER — Ambulatory Visit (INDEPENDENT_AMBULATORY_CARE_PROVIDER_SITE_OTHER): Admitting: *Deleted

## 2015-07-16 ENCOUNTER — Encounter: Payer: Self-pay | Admitting: Allergy and Immunology

## 2015-07-16 VITALS — BP 110/75 | HR 80 | Temp 98.0°F | Resp 16

## 2015-07-16 DIAGNOSIS — J3089 Other allergic rhinitis: Secondary | ICD-10-CM

## 2015-07-16 DIAGNOSIS — J309 Allergic rhinitis, unspecified: Secondary | ICD-10-CM

## 2015-07-16 DIAGNOSIS — H1010 Acute atopic conjunctivitis, unspecified eye: Secondary | ICD-10-CM

## 2015-07-16 DIAGNOSIS — J4530 Mild persistent asthma, uncomplicated: Secondary | ICD-10-CM | POA: Diagnosis not present

## 2015-07-16 DIAGNOSIS — H1045 Other chronic allergic conjunctivitis: Secondary | ICD-10-CM | POA: Diagnosis not present

## 2015-07-16 DIAGNOSIS — K219 Gastro-esophageal reflux disease without esophagitis: Secondary | ICD-10-CM

## 2015-07-16 MED ORDER — OMEPRAZOLE MAGNESIUM 20 MG PO TBEC: DELAYED_RELEASE_TABLET | ORAL | Status: DC

## 2015-07-16 MED ORDER — EPINASTINE HCL 0.05 % OP SOLN: 1.0000 [drp] | Freq: Two times a day (BID) | OPHTHALMIC | Status: DC

## 2015-07-16 MED ORDER — MONTELUKAST SODIUM 10 MG PO TABS: ORAL_TABLET | ORAL | Status: DC

## 2015-07-16 NOTE — Assessment & Plan Note (Signed)
   Treatment plan as outlined above for allergic rhinitis.  A prescription has been provided for Pazeo, one drop per eye daily as needed.  I have also recommended eye lubricant drops (i.e., Natural Tears) as needed. 

## 2015-07-16 NOTE — Patient Instructions (Addendum)
Allergic rhinitis  Continue appropriate allergen avoidance measures.  We will make appropriate modifications to aeroallergen build-up schedule to avoid further large local reactions.  Continue levocetirizine 5 mg daily bedtime and fluticasone nasal spray as needed.  Mild persistent asthma  A prescription has been provided for montelukast 10 mg daily at bedtime.  We will control the patient's acid reflux (see below).  Continue albuterol every 4-6 hours as needed.  GERD (gastroesophageal reflux disease)  Appropriate reflux lifestyle modifications have been discussed and provided in written form.  A prescription has been provided for omeprazole 20 mg daily 30 minutes prior to breakfast.  Seasonal allergic conjunctivitis  Treatment plan as outlined above for allergic rhinitis.  A prescription has been provided for Pazeo, one drop per eye daily as needed.  I have also recommended eye lubricant drops (i.e., Natural Tears) as needed.    Return in about 4 months (around 11/15/2015), or if symptoms worsen or fail to improve.    Lifestyle Changes for Controlling GERD  When you have GERD, stomach acid feels as if it's backing up toward your mouth. Whether or not you take medication to control your GERD, your symptoms can often be improved with lifestyle changes.   Raise Your Head  Reflux is more likely to strike when you're lying down flat, because stomach fluid can  flow backward more easily. Raising the head of your bed 4-6 inches can help. To do this:  Slide blocks or books under the legs at the head of your bed. Or, place a wedge under  the mattress. Many foam stores can make a suitable wedge for you. The wedge  should run from your waist to the top of your head.  Don't just prop your head on several pillows. This increases pressure on your  stomach. It can make GERD worse.  Watch Your Eating Habits Certain foods may increase the acid in your stomach or relax the  lower esophageal sphincter, making GERD more likely. It's best to avoid the following:  Coffee, tea, and carbonated drinks (with and without caffeine)  Fatty, fried, or spicy food  Mint, chocolate, onions, and tomatoes  Any other foods that seem to irritate your stomach or cause you pain  Relieve the Pressure  Eat smaller meals, even if you have to eat more often.  Don't lie down right after you eat. Wait a few hours for your stomach to empty.  Avoid tight belts and tight-fitting clothes.  Lose excess weight.  Tobacco and Alcohol  Avoid smoking tobacco and drinking alcohol. They can make GERD symptoms worse.

## 2015-07-16 NOTE — Assessment & Plan Note (Signed)
   Appropriate reflux lifestyle modifications have been discussed and provided in written form.  A prescription has been provided for omeprazole 20 mg daily 30 minutes prior to breakfast.

## 2015-07-16 NOTE — Progress Notes (Signed)
Follow-up Note  RE: OTELLA Browning MRN: [id number] DOB: 04-Jul-1980 Date of Office Visit: 07/16/2015  Primary care provider: Bartholome Bill, MD Referring provider: Bartholome Bill, MD  History of present illness: HPI Comments: Candace Browning is a 35 y.o. female with persistent asthma, allergic rhinoconjunctivitis on immunotherapy, food allergy, and atopic dermatitis who presents today for follow up.  She was last seen in this office on 06/23/2015.  She has been tolerating aeroallergen immunotherapy buildup however reports that she has recently been having large local reactions with one of the injections.  Overall, her nasal symptoms have improved, however she is still experiencing ocular symptoms despite using Pataday.  She complains of chest tightness when she lies down for bed at night requiring albuterol rescue.  She admits to heartburn when lying down at night.   Assessment and plan: Allergic rhinitis  Continue appropriate allergen avoidance measures.  We will make appropriate modifications to aeroallergen build-up schedule to avoid further large local reactions.  Continue levocetirizine 5 mg daily bedtime and fluticasone nasal spray as needed.  Mild persistent asthma  A prescription has been provided for montelukast 10 mg daily at bedtime.  We will control the patient's acid reflux (see below).  Continue albuterol every 4-6 hours as needed.  GERD (gastroesophageal reflux disease)  Appropriate reflux lifestyle modifications have been discussed and provided in written form.  A prescription has been provided for omeprazole 20 mg daily 30 minutes prior to breakfast.  Seasonal allergic conjunctivitis  Treatment plan as outlined above for allergic rhinitis.  A prescription has been provided for Pazeo, one drop per eye daily as needed.  I have also recommended eye lubricant drops (i.e., Natural Tears) as needed.    Meds ordered this encounter  Medications    . omeprazole (PRILOSEC OTC) 20 MG tablet    Sig: Take one daily as directed.    Dispense:  30 tablet    Refill:  5  . montelukast (SINGULAIR) 10 MG tablet    Sig: Take one tablet once daily in the evening to prevent cough or wheeze.    Dispense:  30 tablet    Refill:  5  . Epinastine HCl (ELESTAT) 0.05 % ophthalmic solution    Sig: Place 1 drop into both eyes 2 (two) times daily.    Dispense:  10 mL    Refill:  12    Diagnositics: Spirometry:  Normal with an FEV1 of 104% predicted.  Please see scanned spirometry results for details.    Physical examination: Blood pressure 110/75, pulse 80, temperature 98 F (36.7 C), temperature source Oral, resp. rate 16.  General: Alert, interactive, in no acute distress. HEENT: TMs pearly gray, turbinates moderately edematous without discharge, post-pharynx mildly erythematous. Neck: Supple without lymphadenopathy. Lungs: Clear to auscultation without wheezing, rhonchi or rales. CV: Normal S1, S2 without murmurs. Skin: Warm and dry, without lesions or rashes.  The following portions of the patient's history were reviewed and updated as appropriate: allergies, current medications, past family history, past medical history, past social history, past surgical history and problem list.    Medication List       This list is accurate as of: 07/16/15  2:05 PM.  Always use your most recent med list.               albuterol 108 (90 Base) MCG/ACT inhaler  Commonly known as:  PROVENTIL HFA;VENTOLIN HFA  Inhale 2 puffs into the lungs every 6 (six) hours as needed.  busPIRone 15 MG tablet  Commonly known as:  BUSPAR     Carbamazepine 100 MG Cp12 12 hr capsule  Commonly known as:  EQUETRO  Take 1 capsule (100 mg total) by mouth 2 (two) times daily.     Epinastine HCl 0.05 % ophthalmic solution  Commonly known as:  ELESTAT  Place 1 drop into both eyes 2 (two) times daily.     FLONASE 50 MCG/ACT nasal spray  Generic drug:  fluticasone      levocetirizine 5 MG tablet  Commonly known as:  XYZAL  Take 1 tablet (5 mg total) by mouth every evening.     lurasidone 40 MG Tabs tablet  Commonly known as:  LATUDA  Take 1 tablet (40 mg total) by mouth daily.     montelukast 10 MG tablet  Commonly known as:  SINGULAIR  Take one tablet once daily in the evening to prevent cough or wheeze.     Olopatadine HCl 0.2 % Soln  Commonly known as:  PATADAY  Apply 1 drop to eye daily.     omeprazole 20 MG tablet  Commonly known as:  PRILOSEC OTC  Take one daily as directed.     valACYclovir 500 MG tablet  Commonly known as:  VALTREX  Take 1 tablet (500 mg total) by mouth 2 (two) times daily as needed (outbreaks).     ZADITOR 0.025 % ophthalmic solution  Generic drug:  ketotifen  1 drop 2 (two) times daily as needed. Reported on 07/16/2015        Allergies  Allergen Reactions  . Apple Anaphylaxis  . Coconut Oil   . Gluten Meal     All breads  . Latex   . Nsaids Other (See Comments)    Thin basement membrane disease, advised to avoid NSAIDS  . Other     PT HAD FOOD ALLERGIES:APPLES, WHEAT, AND PECAN NUTS  . Peanut-Containing Drug Products   . Sulfa Antibiotics   . Hydrogen Peroxide Itching and Rash  . Penicillins Rash    Has patient had a PCN reaction causing immediate rash, facial/tongue/throat swelling, SOB or lightheadedness with hypotension: yes  Has patient had a PCN reaction causing severe rash involving mucus membranes or skin necrosis: no Has patient had a PCN reaction that required hospitalization: no  Has patient had a PCN reaction occurring within the last 10 years: no If all of the above answers are "NO", then may proceed with Cephalosporin use.   . Wheat Bran Rash    I appreciate the opportunity to take part in this Sherrian's care. Please do not hesitate to contact me with questions.  Sincerely,   R. Edgar Frisk, MD

## 2015-07-16 NOTE — Assessment & Plan Note (Addendum)
   Continue appropriate allergen avoidance measures.  We will make appropriate modifications to aeroallergen build-up schedule to avoid further large local reactions.  Continue levocetirizine 5 mg daily bedtime and fluticasone nasal spray as needed.

## 2015-07-16 NOTE — Assessment & Plan Note (Addendum)
   A prescription has been provided for montelukast 10 mg daily at bedtime.  We will control the patient's acid reflux (see below).  Continue albuterol every 4-6 hours as needed.

## 2015-07-26 ENCOUNTER — Ambulatory Visit (INDEPENDENT_AMBULATORY_CARE_PROVIDER_SITE_OTHER): Admitting: *Deleted

## 2015-07-26 DIAGNOSIS — J309 Allergic rhinitis, unspecified: Secondary | ICD-10-CM | POA: Diagnosis not present

## 2015-08-06 ENCOUNTER — Ambulatory Visit (INDEPENDENT_AMBULATORY_CARE_PROVIDER_SITE_OTHER): Admitting: *Deleted

## 2015-08-06 DIAGNOSIS — J309 Allergic rhinitis, unspecified: Secondary | ICD-10-CM

## 2015-08-12 ENCOUNTER — Ambulatory Visit (INDEPENDENT_AMBULATORY_CARE_PROVIDER_SITE_OTHER)

## 2015-08-12 DIAGNOSIS — J309 Allergic rhinitis, unspecified: Secondary | ICD-10-CM

## 2015-08-23 ENCOUNTER — Ambulatory Visit (INDEPENDENT_AMBULATORY_CARE_PROVIDER_SITE_OTHER)

## 2015-08-23 DIAGNOSIS — J309 Allergic rhinitis, unspecified: Secondary | ICD-10-CM

## 2015-08-25 ENCOUNTER — Ambulatory Visit (INDEPENDENT_AMBULATORY_CARE_PROVIDER_SITE_OTHER)

## 2015-08-25 DIAGNOSIS — J309 Allergic rhinitis, unspecified: Secondary | ICD-10-CM | POA: Diagnosis not present

## 2015-09-06 ENCOUNTER — Ambulatory Visit (INDEPENDENT_AMBULATORY_CARE_PROVIDER_SITE_OTHER)

## 2015-09-06 DIAGNOSIS — J309 Allergic rhinitis, unspecified: Secondary | ICD-10-CM | POA: Diagnosis not present

## 2015-09-09 DIAGNOSIS — J301 Allergic rhinitis due to pollen: Secondary | ICD-10-CM | POA: Diagnosis not present

## 2015-09-10 DIAGNOSIS — J3089 Other allergic rhinitis: Secondary | ICD-10-CM | POA: Diagnosis not present

## 2015-09-13 ENCOUNTER — Ambulatory Visit (INDEPENDENT_AMBULATORY_CARE_PROVIDER_SITE_OTHER): Admitting: *Deleted

## 2015-09-13 DIAGNOSIS — J309 Allergic rhinitis, unspecified: Secondary | ICD-10-CM | POA: Diagnosis not present

## 2015-09-29 ENCOUNTER — Ambulatory Visit (INDEPENDENT_AMBULATORY_CARE_PROVIDER_SITE_OTHER)

## 2015-09-29 DIAGNOSIS — J309 Allergic rhinitis, unspecified: Secondary | ICD-10-CM | POA: Diagnosis not present

## 2015-10-06 ENCOUNTER — Ambulatory Visit (INDEPENDENT_AMBULATORY_CARE_PROVIDER_SITE_OTHER): Admitting: *Deleted

## 2015-10-06 DIAGNOSIS — J309 Allergic rhinitis, unspecified: Secondary | ICD-10-CM | POA: Diagnosis not present

## 2015-10-14 ENCOUNTER — Ambulatory Visit (INDEPENDENT_AMBULATORY_CARE_PROVIDER_SITE_OTHER)

## 2015-10-14 DIAGNOSIS — J309 Allergic rhinitis, unspecified: Secondary | ICD-10-CM | POA: Diagnosis not present

## 2015-10-26 ENCOUNTER — Ambulatory Visit (INDEPENDENT_AMBULATORY_CARE_PROVIDER_SITE_OTHER)

## 2015-10-26 DIAGNOSIS — J309 Allergic rhinitis, unspecified: Secondary | ICD-10-CM

## 2015-11-08 ENCOUNTER — Ambulatory Visit (INDEPENDENT_AMBULATORY_CARE_PROVIDER_SITE_OTHER)

## 2015-11-08 DIAGNOSIS — J309 Allergic rhinitis, unspecified: Secondary | ICD-10-CM | POA: Diagnosis not present

## 2015-11-18 ENCOUNTER — Ambulatory Visit (INDEPENDENT_AMBULATORY_CARE_PROVIDER_SITE_OTHER): Admitting: *Deleted

## 2015-11-18 DIAGNOSIS — J309 Allergic rhinitis, unspecified: Secondary | ICD-10-CM | POA: Diagnosis not present

## 2015-11-23 ENCOUNTER — Ambulatory Visit: Admitting: Allergy and Immunology

## 2015-11-23 ENCOUNTER — Ambulatory Visit (INDEPENDENT_AMBULATORY_CARE_PROVIDER_SITE_OTHER)

## 2015-11-23 DIAGNOSIS — J309 Allergic rhinitis, unspecified: Secondary | ICD-10-CM

## 2015-12-15 ENCOUNTER — Ambulatory Visit (INDEPENDENT_AMBULATORY_CARE_PROVIDER_SITE_OTHER): Admitting: *Deleted

## 2015-12-15 DIAGNOSIS — J309 Allergic rhinitis, unspecified: Secondary | ICD-10-CM | POA: Diagnosis not present

## 2015-12-21 ENCOUNTER — Encounter (INDEPENDENT_AMBULATORY_CARE_PROVIDER_SITE_OTHER): Payer: Self-pay

## 2015-12-21 ENCOUNTER — Encounter: Payer: Self-pay | Admitting: Allergy and Immunology

## 2015-12-21 ENCOUNTER — Ambulatory Visit (INDEPENDENT_AMBULATORY_CARE_PROVIDER_SITE_OTHER): Admitting: Allergy and Immunology

## 2015-12-21 VITALS — BP 112/72 | HR 72 | Temp 97.6°F | Resp 16 | Ht 66.0 in | Wt 135.4 lb

## 2015-12-21 DIAGNOSIS — H1045 Other chronic allergic conjunctivitis: Secondary | ICD-10-CM | POA: Diagnosis not present

## 2015-12-21 DIAGNOSIS — H1010 Acute atopic conjunctivitis, unspecified eye: Secondary | ICD-10-CM

## 2015-12-21 DIAGNOSIS — J3089 Other allergic rhinitis: Secondary | ICD-10-CM

## 2015-12-21 DIAGNOSIS — K219 Gastro-esophageal reflux disease without esophagitis: Secondary | ICD-10-CM

## 2015-12-21 DIAGNOSIS — T7800XD Anaphylactic reaction due to unspecified food, subsequent encounter: Secondary | ICD-10-CM

## 2015-12-21 DIAGNOSIS — J0110 Acute frontal sinusitis, unspecified: Secondary | ICD-10-CM | POA: Diagnosis not present

## 2015-12-21 DIAGNOSIS — J4531 Mild persistent asthma with (acute) exacerbation: Secondary | ICD-10-CM | POA: Diagnosis not present

## 2015-12-21 MED ORDER — FLUTICASONE PROPIONATE 50 MCG/ACT NA SUSP: 2.0000 | Freq: Every day | NASAL | 3 refills | Status: DC

## 2015-12-21 MED ORDER — PREDNISONE 1 MG PO TABS: 10.0000 mg | ORAL_TABLET | Freq: Every day | ORAL | Status: DC

## 2015-12-21 MED ORDER — AZELASTINE HCL 0.05 % OP SOLN: 1.0000 [drp] | Freq: Two times a day (BID) | OPHTHALMIC | 5 refills | Status: DC

## 2015-12-21 MED ORDER — AZELASTINE HCL 0.15 % NA SOLN: 2.0000 | Freq: Two times a day (BID) | NASAL | 5 refills | Status: DC

## 2015-12-21 MED ORDER — AZELASTINE HCL 0.15 % NA SOLN: 1.0000 | Freq: Two times a day (BID) | NASAL | 5 refills | Status: DC

## 2015-12-21 NOTE — Assessment & Plan Note (Signed)
   Continue meticulous avoidance of culprit foods and have access to epinephrine autoinjector 2 pack in case of accidental ingestion.  Emergency allergy action plan is in place.

## 2015-12-21 NOTE — Assessment & Plan Note (Signed)
   Prednisone has been provided, 20 mg x 4 days, 10 mg x1 day, then stop.  A prescription has been provided for azelastine nasal spray, 1-2 sprays per nostril 2 times daily as needed. Proper nasal spray technique has been discussed and demonstrated.   Continue fluticasone nasal spray, 2 sprays per nostril daily as needed.  Nasal saline lavage (NeilMed) as needed has been recommended along with instructions for proper administration.  For thick post nasal drainage, nasal congestion, and/or sinus pressure, add guaifenesin 1200 mg (Mucinex Maximum Strength) plus/minus pseudoephedrine 120 mg  twice daily as needed with adequate hydration as discussed. Pseudoephedrine is only to be used for short-term relief of nasal/sinus congestion. Long-term use is discouraged due to potential side effects.  The patient has been asked to contact me if her symptoms persist, progress, or if she becomes febrile. Otherwise, she may return for follow up in 4 months.

## 2015-12-21 NOTE — Assessment & Plan Note (Signed)
   Continue appropriate allergen avoidance measures, levocetirizine 5 mg daily as needed, and fluticasone nasal spray as needed.  A prescription has been provided for azelastine nasal spray (as above).

## 2015-12-21 NOTE — Assessment & Plan Note (Signed)
   Continue appropriate reflux lifestyle modifications and omeprazole as prescribed. 

## 2015-12-21 NOTE — Patient Instructions (Addendum)
Acute sinusitis  Prednisone has been provided, 20 mg x 4 days, 10 mg x1 day, then stop.  A prescription has been provided for azelastine nasal spray, 1-2 sprays per nostril 2 times daily as needed. Proper nasal spray technique has been discussed and demonstrated.   Continue fluticasone nasal spray, 2 sprays per nostril daily as needed.  Nasal saline lavage (NeilMed) as needed has been recommended along with instructions for proper administration.  For thick post nasal drainage, nasal congestion, and/or sinus pressure, add guaifenesin 1200 mg (Mucinex Maximum Strength) plus/minus pseudoephedrine 120 mg  twice daily as needed with adequate hydration as discussed. Pseudoephedrine is only to be used for short-term relief of nasal/sinus congestion. Long-term use is discouraged due to potential side effects.  The patient has been asked to contact me if her symptoms persist, progress, or if she becomes febrile. Otherwise, she may return for follow up in 4 months.  Mild persistent asthma Mild exacerbation.  Prednisone has been provided (as above).  Continue montelukast 10 mg daily at bedtime and albuterol every 4-6 hours as needed.  The patient has been asked to contact me if her symptoms persist or progress.   Allergic rhinitis  Continue appropriate allergen avoidance measures, levocetirizine 5 mg daily as needed, and fluticasone nasal spray as needed.  A prescription has been provided for azelastine nasal spray (as above).  Seasonal allergic conjunctivitis  Treatment plan as outlined above for allergic rhinitis.  A prescription has been provided for Optivar, one drop per eye twice daily as needed.  Food allergy  Continue meticulous avoidance of culprit foods and have access to epinephrine autoinjector 2 pack in case of accidental ingestion.  Emergency allergy action plan is in place.  GERD (gastroesophageal reflux disease)  Continue appropriate reflux lifestyle modifications and  omeprazole as prescribed.   Return in about 4 months (around 04/22/2016), or if symptoms worsen or fail to improve.

## 2015-12-21 NOTE — Assessment & Plan Note (Signed)
   Treatment plan as outlined above for allergic rhinitis.  A prescription has been provided for Optivar, one drop per eye twice daily as needed.

## 2015-12-21 NOTE — Assessment & Plan Note (Addendum)
Mild exacerbation.  Prednisone has been provided (as above).  Continue montelukast 10 mg daily at bedtime and albuterol every 4-6 hours as needed.  The patient has been asked to contact me if her symptoms persist or progress.

## 2015-12-21 NOTE — Progress Notes (Signed)
Follow-up Note  RE: Candace Browning MRN: [id number] DOB: 1980/07/20 Date of Office Visit: 12/21/2015  Primary care provider: Bartholome Bill, MD Referring provider: Bartholome Bill, MD  History of present illness: Candace Browning is a 35 y.o. female with persistent asthma, allergic rhinoconjunctivitis, and gastroesophageal reflux disease presenting today for sick visit.  She was last seen in this clinic on 07/16/2015.   She reports that over the past few days she has been experiencing frontal sinus pressure, nasal congestion, and "incessantly runny nose" despite compliance with fluticasone nasal spray and levocetirizine.   She denies fevers and chills.  She also complains of asthma symptoms, consisting of coughing, dyspnea, chest tightness, and wheezing, over the past couple days.  The symptoms tend to be most prominent in the evening.  She states that Pataday does not provide adequate symptom relief for her ocular allergy symptoms.  She requests Optivar as this medication provided greater relief in the past.  Regarding food allergies, she is avoiding culprit foods and has access to epinephrine autoinjectors.  She reports that her acid reflux is well-controlled.   Assessment and plan: Acute sinusitis  Prednisone has been provided, 20 mg x 4 days, 10 mg x1 day, then stop.  A prescription has been provided for azelastine nasal spray, 1-2 sprays per nostril 2 times daily as needed. Proper nasal spray technique has been discussed and demonstrated.   Continue fluticasone nasal spray, 2 sprays per nostril daily as needed.  Nasal saline lavage (NeilMed) as needed has been recommended along with instructions for proper administration.  For thick post nasal drainage, nasal congestion, and/or sinus pressure, add guaifenesin 1200 mg (Mucinex Maximum Strength) plus/minus pseudoephedrine 120 mg  twice daily as needed with adequate hydration as discussed. Pseudoephedrine is only to be used for  short-term relief of nasal/sinus congestion. Long-term use is discouraged due to potential side effects.  The patient has been asked to contact me if her symptoms persist, progress, or if she becomes febrile. Otherwise, she may return for follow up in 4 months.  Mild persistent asthma Mild exacerbation.  Prednisone has been provided (as above).  Continue montelukast 10 mg daily at bedtime and albuterol every 4-6 hours as needed.  The patient has been asked to contact me if her symptoms persist or progress.   Allergic rhinitis  Continue appropriate allergen avoidance measures, levocetirizine 5 mg daily as needed, and fluticasone nasal spray as needed.  A prescription has been provided for azelastine nasal spray (as above).  Seasonal allergic conjunctivitis  Treatment plan as outlined above for allergic rhinitis.  A prescription has been provided for Optivar, one drop per eye twice daily as needed.  Food allergy  Continue meticulous avoidance of culprit foods and have access to epinephrine autoinjector 2 pack in case of accidental ingestion.  Emergency allergy action plan is in place.  GERD (gastroesophageal reflux disease)  Continue appropriate reflux lifestyle modifications and omeprazole as prescribed.   Meds ordered this encounter  Medications  . DISCONTD: Azelastine HCl 0.15 % SOLN    Sig: Place 1-2 sprays into both nostrils 2 (two) times daily.    Dispense:  30 mL    Refill:  5  . fluticasone (FLONASE) 50 MCG/ACT nasal spray    Sig: Place 2 sprays into both nostrils daily.    Dispense:  16 g    Refill:  3  . predniSONE (DELTASONE) tablet 10 mg  . DISCONTD: predniSONE (DELTASONE) tablet 10 mg  . azelastine (OPTIVAR) 0.05 %  ophthalmic solution    Sig: Place 1 drop into both eyes 2 (two) times daily.    Dispense:  6 mL    Refill:  5  . Azelastine HCl 0.15 % SOLN    Sig: Place 2 sprays into both nostrils 2 (two) times daily.    Dispense:  30 mL    Refill:  5    Diagnostics: Spirometry:  Normal with an FEV1 of 105% predicted.  Please see scanned spirometry results for details.    Physical examination: Blood pressure 112/72, pulse 72, temperature 97.6 F (36.4 C), temperature source Oral, resp. rate 16, height 5\' 6"  (1.676 m), weight 135 lb 6.4 oz (61.4 kg), SpO2 98 %.  General: Alert, interactive, in no acute distress. HEENT: TMs pearly gray, turbinates edematous with clear discharge, post-pharynx erythematous. Neck: Supple without lymphadenopathy. Lungs: Clear to auscultation without wheezing, rhonchi or rales. CV: Normal S1, S2 without murmurs. Skin: Warm and dry, without lesions or rashes.  The following portions of the patient's history were reviewed and updated as appropriate: allergies, current medications, past family history, past medical history, past social history, past surgical history and problem list.    Medication List       Accurate as of 12/21/15  1:46 PM. Always use your most recent med list.          albuterol 108 (90 Base) MCG/ACT inhaler Commonly known as:  PROVENTIL HFA;VENTOLIN HFA Inhale 2 puffs into the lungs every 6 (six) hours as needed.   ALPRAZolam 0.25 MG tablet Commonly known as:  XANAX Take 0.25 mg by mouth as needed.   azelastine 0.05 % ophthalmic solution Commonly known as:  OPTIVAR Place 1 drop into both eyes 2 (two) times daily.   Azelastine HCl 0.15 % Soln Place 2 sprays into both nostrils 2 (two) times daily.   busPIRone 15 MG tablet Commonly known as:  BUSPAR   Carbamazepine 100 MG Cp12 12 hr capsule Commonly known as:  EQUETRO Take 1 capsule (100 mg total) by mouth 2 (two) times daily.   Epinastine HCl 0.05 % ophthalmic solution Commonly known as:  ELESTAT Place 1 drop into both eyes 2 (two) times daily.   EPINEPHrine 0.3 mg/0.3 mL Soaj injection Commonly known as:  EPI-PEN Inject 0.3 mg into the muscle as needed.   fluticasone 50 MCG/ACT nasal spray Commonly known as:   FLONASE Place 2 sprays into both nostrils daily.   levocetirizine 5 MG tablet Commonly known as:  XYZAL Take 1 tablet (5 mg total) by mouth every evening.   lithium carbonate 300 MG capsule Take 300 mg by mouth daily.   lurasidone 40 MG Tabs tablet Commonly known as:  LATUDA Take 1 tablet (40 mg total) by mouth daily.   montelukast 10 MG tablet Commonly known as:  SINGULAIR Take one tablet once daily in the evening to prevent cough or wheeze.   Olopatadine HCl 0.2 % Soln Commonly known as:  PATADAY Apply 1 drop to eye daily.   omeprazole 20 MG tablet Commonly known as:  PRILOSEC OTC Take one daily as directed.   valACYclovir 500 MG tablet Commonly known as:  VALTREX Take 1 tablet (500 mg total) by mouth 2 (two) times daily as needed (outbreaks).   ZADITOR 0.025 % ophthalmic solution Generic drug:  ketotifen 1 drop 2 (two) times daily as needed. Reported on 07/16/2015       Allergies  Allergen Reactions  . Apple Anaphylaxis  . Coconut Oil   . Gluten Meal  All breads  . Latex   . Nsaids Other (See Comments)    Thin basement membrane disease, advised to avoid NSAIDS  . Other     PT HAD FOOD ALLERGIES:APPLES, WHEAT, AND PECAN NUTS  . Peanut-Containing Drug Products   . Sulfa Antibiotics   . Hydrogen Peroxide Itching and Rash  . Penicillins Rash    Has patient had a PCN reaction causing immediate rash, facial/tongue/throat swelling, SOB or lightheadedness with hypotension: yes  Has patient had a PCN reaction causing severe rash involving mucus membranes or skin necrosis: no Has patient had a PCN reaction that required hospitalization: no  Has patient had a PCN reaction occurring within the last 10 years: no If all of the above answers are "NO", then may proceed with Cephalosporin use.   . Wheat Bran Rash    Review of systems: Review of systems negative except as noted in HPI / PMHx or noted below: Constitutional: Negative.  HENT: Negative.   Eyes:  Negative.  Respiratory: Negative.   Cardiovascular: Negative.  Gastrointestinal: Negative.  Genitourinary: Negative.  Musculoskeletal: Negative.  Neurological: Negative.  Endo/Heme/Allergies: Negative.  Cutaneous: Negative.  Past Medical History:  Diagnosis Date  . Allergic rhinitis   . Anemia   . Anxiety   . Asthma   . Atopic dermatitis   . Bipolar 1 disorder ([place])   . Eczema   . Food allergy   . Herpes simplex without mention of complication    HSV2  . Renal disorder     Family History  Problem Relation Age of Onset  . Asthma Mother   . Anemia Mother   . Arthritis Mother     HAD KNEE REPLACEMENT  . Diabetes Maternal Grandmother   . Arthritis Maternal Grandmother     KNEE REPLACEMENT    Social History   Social History  . Marital status: Single    Spouse name: N/A  . Number of children: N/A  . Years of education: N/A   Occupational History  . Not on file.   Social History Main Topics  . Smoking status: Never Smoker  . Smokeless tobacco: Never Used  . Alcohol use 2.4 oz/week    4 Glasses of wine per week  . Drug use: No  . Sexual activity: Yes    Birth control/ protection: IUD     Comment: MIRENA (placed July 2012)   Other Topics Concern  . Not on file   Social History Narrative  . No narrative on file    I appreciate the opportunity to take part in Idalee's care. Please do not hesitate to contact me with questions.  Sincerely,   R. Edgar Frisk, MD

## 2016-01-04 ENCOUNTER — Emergency Department (HOSPITAL_COMMUNITY)
Admission: EM | Admit: 2016-01-04 | Discharge: 2016-01-05 | Disposition: A | Discharge status: Other Acute Inpatient Hospital | Attending: Emergency Medicine | Admitting: Emergency Medicine

## 2016-01-04 ENCOUNTER — Encounter (HOSPITAL_COMMUNITY): Payer: Self-pay

## 2016-01-04 DIAGNOSIS — F332 Major depressive disorder, recurrent severe without psychotic features: Secondary | ICD-10-CM

## 2016-01-04 DIAGNOSIS — Z79899 Other long term (current) drug therapy: Secondary | ICD-10-CM | POA: Insufficient documentation

## 2016-01-04 DIAGNOSIS — J45909 Unspecified asthma, uncomplicated: Secondary | ICD-10-CM | POA: Insufficient documentation

## 2016-01-04 DIAGNOSIS — F314 Bipolar disorder, current episode depressed, severe, without psychotic features: Secondary | ICD-10-CM | POA: Diagnosis not present

## 2016-01-04 DIAGNOSIS — R45851 Suicidal ideations: Secondary | ICD-10-CM

## 2016-01-04 DIAGNOSIS — R45850 Homicidal ideations: Secondary | ICD-10-CM | POA: Diagnosis present

## 2016-01-04 LAB — CBC
HCT: 35.1 % — ABNORMAL LOW (ref 36.0–46.0)
Hemoglobin: 11.7 g/dL — ABNORMAL LOW (ref 12.0–15.0)
MCH: 29.5 pg (ref 26.0–34.0)
MCHC: 33.3 g/dL (ref 30.0–36.0)
MCV: 88.6 fL (ref 78.0–100.0)
PLATELETS: 308 10*3/uL (ref 150–400)
RBC: 3.96 MIL/uL (ref 3.87–5.11)
RDW: 11.8 % (ref 11.5–15.5)
WBC: 8 10*3/uL (ref 4.0–10.5)

## 2016-01-04 NOTE — ED Triage Notes (Signed)
PT STS SHE IS DEPRESSED AND WANTS TO KILL HERSELF BY TAKING A LOT OF PILLS. PT STS SHE ATTEMPTED SUICIDE 6 MONTHS AGO. SHE STS SHE TAKES HER MEDICATION, BUT THEY ARE NOT HELPING. PT'S HUSBAND STS HE HAS TO KEEP HER PILLS LOCKED AWAY.

## 2016-01-04 NOTE — ED Notes (Signed)
PER THE HUSBAND, HE THINKS THAT SHE HAS BEEN SAVING THE MEDICATIONS THAT HE GIVES HER, AND THAT IS WHY THEY AREN'T WORKING FOR HER. HE STS, "SHE TOLD ME TODAY THAT I NEED TO TAKE HER TO THE HOSPITAL BECAUSE SHE WAS GOING TO KILL HERSELF AND SHE DOESN'T WANT TO DIE."

## 2016-01-04 NOTE — ED Provider Notes (Signed)
[facility] DEPT Provider Note   CSN: [id number] Arrival date & time: 01/04/16  2125  By signing my name below, I, Higinio Plan, attest that this documentation has been prepared under the direction and in the presence of Orpah Greek, MD . Electronically Signed: Higinio Plan, Scribe. 01/04/2016. 11:27 PM.  History   Chief Complaint Chief Complaint  Patient presents with  . Suicidal   The history is provided by the patient and the spouse. No language interpreter was used.   HPI Comments: Candace Browning is a 35 y.o. female who presents to the Emergency Department for an evaluation of SI with a plan to harm herself. Pt reports she began to feel "unsafe" this evening and told her husband about her plan to harm herself by "taking pills." Per husband, pt has been "hoarding her pills" in order to take them all at once. She denies HI and auditory or visual hallucinations.   Past Medical History:  Diagnosis Date  . Allergic rhinitis   . Anemia   . Anxiety   . Asthma   . Atopic dermatitis   . Bipolar 1 disorder ([place])   . Eczema   . Food allergy   . Herpes simplex without mention of complication    HSV2  . Renal disorder     Patient Active Problem List   Diagnosis Date Noted  . Acute sinusitis 12/21/2015  . GERD (gastroesophageal reflux disease) 07/16/2015  . Seasonal allergic conjunctivitis 06/23/2015  . Severe episode of recurrent major depressive disorder, without psychotic features ([place])   . Severe recurrent major depression without psychotic features ([place]) 05/14/2015  . Major depressive disorder, recurrent episode, severe ([place]) 05/14/2015  . Mild persistent asthma 01/11/2015  . Allergic rhinitis 01/11/2015  . Food allergy 01/11/2015  . Atopic dermatitis 01/11/2015    Past Surgical History:  Procedure Laterality Date  . WISDOM TOOTH EXTRACTION      OB History    Gravida Para Term Preterm AB Living   1         1   SAB TAB Ectopic Multiple Live Births            Home Medications    Prior to Admission medications   Medication Sig Start Date End Date Taking? Authorizing Provider  albuterol (PROVENTIL HFA;VENTOLIN HFA) 108 (90 Base) MCG/ACT inhaler Inhale 2 puffs into the lungs every 6 (six) hours as needed.   Yes Historical Provider, MD  ALPRAZolam (XANAX) 0.25 MG tablet Take 0.25 mg by mouth as needed. 03/06/15  Yes Historical Provider, MD  busPIRone (BUSPAR) 15 MG tablet Take 15 mg by mouth 2 (two) times daily.  07/01/15  Yes Historical Provider, MD  carbamazepine (TEGRETOL) 200 MG tablet Take 200 mg by mouth 2 (two) times daily.   Yes Historical Provider, MD  EPINEPHrine 0.3 mg/0.3 mL IJ SOAJ injection Inject 0.3 mg into the muscle as needed. 08/24/14  Yes Historical Provider, MD  fluticasone (FLONASE) 50 MCG/ACT nasal spray Place 2 sprays into both nostrils daily. 12/21/15 12/20/16 Yes Adelina Mings, MD  levocetirizine (XYZAL) 5 MG tablet Take 1 tablet (5 mg total) by mouth every evening. 07/08/15  Yes Adelina Mings, MD  lithium carbonate 300 MG capsule Take 300 mg by mouth at bedtime.  12/06/15  Yes Historical Provider, MD  valACYclovir (VALTREX) 500 MG tablet Take 1 tablet (500 mg total) by mouth 2 (two) times daily as needed (outbreaks). 05/18/15  Yes Kerrie Buffalo, NP  azelastine (OPTIVAR) 0.05 % ophthalmic solution  Place 1 drop into both eyes 2 (two) times daily. Patient not taking: Reported on 01/04/2016 12/21/15   Adelina Mings, MD  Azelastine HCl 0.15 % SOLN Place 2 sprays into both nostrils 2 (two) times daily. Patient not taking: Reported on 01/04/2016 12/21/15   Adelina Mings, MD  Carbamazepine (EQUETRO) 100 MG CP12 12 hr capsule Take 1 capsule (100 mg total) by mouth 2 (two) times daily. Patient not taking: Reported on 01/04/2016 05/18/15   Kerrie Buffalo, NP  Epinastine HCl (ELESTAT) 0.05 % ophthalmic solution Place 1 drop into both eyes 2 (two) times daily. Patient not taking: Reported on 01/04/2016 07/16/15    Adelina Mings, MD  lurasidone (LATUDA) 40 MG TABS tablet Take 1 tablet (40 mg total) by mouth daily. Patient not taking: Reported on 12/21/2015 05/18/15   Kerrie Buffalo, NP  montelukast (SINGULAIR) 10 MG tablet Take one tablet once daily in the evening to prevent cough or wheeze. Patient not taking: Reported on 12/21/2015 07/16/15   Adelina Mings, MD  Olopatadine HCl (PATADAY) 0.2 % SOLN Apply 1 drop to eye daily. Patient not taking: Reported on 12/21/2015 06/23/15   Adelina Mings, MD  omeprazole (PRILOSEC OTC) 20 MG tablet Take one daily as directed. Patient not taking: Reported on 12/21/2015 07/16/15   Adelina Mings, MD    Family History Family History  Problem Relation Age of Onset  . Asthma Mother   . Anemia Mother   . Arthritis Mother     HAD KNEE REPLACEMENT  . Diabetes Maternal Grandmother   . Arthritis Maternal Grandmother     KNEE REPLACEMENT    Social History Social History  Substance Use Topics  . Smoking status: Never Smoker  . Smokeless tobacco: Never Used  . Alcohol use 2.4 oz/week    4 Glasses of wine per week     Allergies   Apple; Coconut oil; Gluten meal; Latex; Nsaids; Other; Peanut-containing drug products; Sulfa antibiotics; Hydrogen peroxide; Penicillins; and Wheat bran   Review of Systems Review of Systems  Constitutional: Negative for fever.  Psychiatric/Behavioral: Positive for suicidal ideas. Negative for hallucinations.  All other systems reviewed and are negative.  Physical Exam Updated Vital Signs Ht 5\' 6"  (1.676 m)   Wt 137 lb (62.1 kg)   LMP 01/03/2016   BMI 22.11 kg/m   Physical Exam  Constitutional: She is oriented to person, place, and time. She appears well-developed and well-nourished. No distress.  HENT:  Head: Normocephalic and atraumatic.  Right Ear: Hearing normal.  Left Ear: Hearing normal.  Nose: Nose normal.  Mouth/Throat: Oropharynx is clear and moist and mucous membranes are normal.  Eyes:  Conjunctivae and EOM are normal. Pupils are equal, round, and reactive to light.  Neck: Normal range of motion. Neck supple.  Cardiovascular: Regular rhythm, S1 normal and S2 normal.  Exam reveals no gallop and no friction rub.   No murmur heard. Pulmonary/Chest: Effort normal and breath sounds normal. No respiratory distress. She exhibits no tenderness.  Abdominal: Soft. Normal appearance and bowel sounds are normal. There is no hepatosplenomegaly. There is no tenderness. There is no rebound, no guarding, no tenderness at McBurney's point and negative Murphy's sign. No hernia.  Musculoskeletal: Normal range of motion.  Neurological: She is alert and oriented to person, place, and time. She has normal strength. No cranial nerve deficit or sensory deficit. Coordination normal. GCS eye subscore is 4. GCS verbal subscore is 5. GCS motor subscore is 6.  Skin: Skin is  warm, dry and intact. No rash noted. No cyanosis.  Psychiatric: Her speech is delayed. She is slowed and withdrawn. She exhibits a depressed mood. She expresses suicidal ideation. She expresses suicidal plans.  Nursing note and vitals reviewed.  ED Treatments / Results  Labs (all labs ordered are listed, but only abnormal results are displayed) Labs Reviewed  COMPREHENSIVE METABOLIC PANEL  ETHANOL  SALICYLATE LEVEL  ACETAMINOPHEN LEVEL  CBC  RAPID URINE DRUG SCREEN, HOSP PERFORMED  HCG, QUANTITATIVE, PREGNANCY    EKG  EKG Interpretation None       Radiology No results found.  Procedures Procedures (including critical care time)  Medications Ordered in ED Medications - No data to display  DIAGNOSTIC STUDIES:   COORDINATION OF CARE:  11:23 PM Discussed treatment plan with pt at bedside and pt agreed to plan.  Initial Impression / Assessment and Plan / ED Course  I have reviewed the triage vital signs and the nursing notes.  Pertinent labs & imaging results that were available during my care of the patient  were reviewed by me and considered in my medical decision making (see chart for details).  Clinical Course    Patient presents to the emergency room with complaints of increased depression. Patient reports that she has been feeling worse with her depression over the last several days. She is now feeling suicidal. She does have previous suicide attempt. She has been saving up her medications for the last 4 days to overdose on them. Rather than take the pills today, however, she did tell her husband about her implant and he brought her to the ER for evaluation. Patient continues to be suicidal here in the ER.  I personally performed the services described in this documentation, which was scribed in my presence. The recorded information has been reviewed and is accurate.   Final Clinical Impressions(s) / ED Diagnoses   Final diagnoses:  Suicidal ideation  Depression, unspecified depression type    New Prescriptions New Prescriptions   No medications on file     Orpah Greek, MD 01/04/16 2337

## 2016-01-04 NOTE — ED Notes (Addendum)
Pt. In burgundy scrubs. Pt. And belongings searched and wanded by security. Pt. Has 1 black/white overnite bag, 1 pr gray crocs.pt. Husband took cell phone and meds to vehicle and some clothing. Pt. Belongings locked up at the nurses station in triage.

## 2016-01-05 ENCOUNTER — Encounter: Payer: Self-pay | Admitting: Psychiatry

## 2016-01-05 ENCOUNTER — Inpatient Hospital Stay
Admission: EM | Admit: 2016-01-05 | Discharge: 2016-01-08 | DRG: 885 | Disposition: A | Discharge status: Home / Self Care | Source: Intra-hospital | Attending: Psychiatry | Admitting: Psychiatry

## 2016-01-05 ENCOUNTER — Inpatient Hospital Stay: Admission: RE | Admit: 2016-01-05 | Source: Intra-hospital | Admitting: Psychiatry

## 2016-01-05 ENCOUNTER — Other Ambulatory Visit: Payer: Self-pay | Admitting: Allergy and Immunology

## 2016-01-05 DIAGNOSIS — F3130 Bipolar disorder, current episode depressed, mild or moderate severity, unspecified: Secondary | ICD-10-CM | POA: Diagnosis not present

## 2016-01-05 DIAGNOSIS — Z880 Allergy status to penicillin: Secondary | ICD-10-CM

## 2016-01-05 DIAGNOSIS — Z825 Family history of asthma and other chronic lower respiratory diseases: Secondary | ICD-10-CM

## 2016-01-05 DIAGNOSIS — K219 Gastro-esophageal reflux disease without esophagitis: Secondary | ICD-10-CM | POA: Diagnosis present

## 2016-01-05 DIAGNOSIS — Z888 Allergy status to other drugs, medicaments and biological substances status: Secondary | ICD-10-CM | POA: Diagnosis not present

## 2016-01-05 DIAGNOSIS — F314 Bipolar disorder, current episode depressed, severe, without psychotic features: Principal | ICD-10-CM | POA: Diagnosis present

## 2016-01-05 DIAGNOSIS — Z881 Allergy status to other antibiotic agents status: Secondary | ICD-10-CM

## 2016-01-05 DIAGNOSIS — Z91040 Latex allergy status: Secondary | ICD-10-CM | POA: Diagnosis not present

## 2016-01-05 DIAGNOSIS — J019 Acute sinusitis, unspecified: Secondary | ICD-10-CM | POA: Diagnosis present

## 2016-01-05 DIAGNOSIS — R45851 Suicidal ideations: Secondary | ICD-10-CM | POA: Diagnosis present

## 2016-01-05 DIAGNOSIS — A6000 Herpesviral infection of urogenital system, unspecified: Secondary | ICD-10-CM | POA: Diagnosis present

## 2016-01-05 DIAGNOSIS — G4700 Insomnia, unspecified: Secondary | ICD-10-CM | POA: Diagnosis present

## 2016-01-05 DIAGNOSIS — Z886 Allergy status to analgesic agent status: Secondary | ICD-10-CM | POA: Diagnosis not present

## 2016-01-05 DIAGNOSIS — F410 Panic disorder [episodic paroxysmal anxiety] without agoraphobia: Secondary | ICD-10-CM | POA: Diagnosis present

## 2016-01-05 DIAGNOSIS — J4520 Mild intermittent asthma, uncomplicated: Secondary | ICD-10-CM | POA: Diagnosis present

## 2016-01-05 DIAGNOSIS — F429 Obsessive-compulsive disorder, unspecified: Secondary | ICD-10-CM | POA: Diagnosis present

## 2016-01-05 DIAGNOSIS — Z91018 Allergy to other foods: Secondary | ICD-10-CM | POA: Diagnosis not present

## 2016-01-05 DIAGNOSIS — J4530 Mild persistent asthma, uncomplicated: Secondary | ICD-10-CM | POA: Diagnosis present

## 2016-01-05 DIAGNOSIS — Z79899 Other long term (current) drug therapy: Secondary | ICD-10-CM

## 2016-01-05 DIAGNOSIS — Z91010 Allergy to peanuts: Secondary | ICD-10-CM | POA: Diagnosis not present

## 2016-01-05 LAB — COMPREHENSIVE METABOLIC PANEL
ALK PHOS: 77 U/L (ref 38–126)
ALT: 14 U/L (ref 14–54)
ANION GAP: 7 (ref 5–15)
AST: 18 U/L (ref 15–41)
Albumin: 4.1 g/dL (ref 3.5–5.0)
BILIRUBIN TOTAL: 0.6 mg/dL (ref 0.3–1.2)
BUN: 9 mg/dL (ref 6–20)
CALCIUM: 9.1 mg/dL (ref 8.9–10.3)
CO2: 24 mmol/L (ref 22–32)
Chloride: 107 mmol/L (ref 101–111)
Creatinine, Ser: 0.93 mg/dL (ref 0.44–1.00)
Glucose, Bld: 102 mg/dL — ABNORMAL HIGH (ref 65–99)
Potassium: 3.3 mmol/L — ABNORMAL LOW (ref 3.5–5.1)
SODIUM: 138 mmol/L (ref 135–145)
TOTAL PROTEIN: 7.4 g/dL (ref 6.5–8.1)

## 2016-01-05 LAB — HCG, QUANTITATIVE, PREGNANCY: hCG, Beta Chain, Quant, S: <1 m[IU]/mL (ref <5)

## 2016-01-05 LAB — RAPID URINE DRUG SCREEN, HOSP PERFORMED
Amphetamines: NOT DETECTED
BARBITURATES: NOT DETECTED
Benzodiazepines: NOT DETECTED
Cocaine: NOT DETECTED
Opiates: NOT DETECTED
Tetrahydrocannabinol: NOT DETECTED

## 2016-01-05 LAB — ACETAMINOPHEN LEVEL

## 2016-01-05 LAB — ETHANOL

## 2016-01-05 LAB — SALICYLATE LEVEL

## 2016-01-05 MED ORDER — ALPRAZOLAM 0.25 MG PO TABS: 0.2500 mg | ORAL_TABLET | ORAL | Status: DC | PRN

## 2016-01-05 MED ORDER — HYDROXYZINE HCL 25 MG PO TABS: 25.0000 mg | ORAL_TABLET | Freq: Three times a day (TID) | ORAL | Status: DC | PRN

## 2016-01-05 MED ORDER — ALUM & MAG HYDROXIDE-SIMETH 200-200-20 MG/5ML PO SUSP: 30.0000 mL | ORAL | Status: DC | PRN

## 2016-01-05 MED ORDER — MAGNESIUM HYDROXIDE 400 MG/5ML PO SUSP: 30.0000 mL | Freq: Every day | ORAL | Status: DC | PRN

## 2016-01-05 MED ORDER — LITHIUM CARBONATE 300 MG PO CAPS
300.0000 mg | ORAL_CAPSULE | Freq: Every day | ORAL | Status: DC
  Administered 2016-01-05: 300 mg via ORAL
  Filled 2016-01-05: qty 1

## 2016-01-05 MED ORDER — ONDANSETRON HCL 4 MG PO TABS: 4.0000 mg | ORAL_TABLET | Freq: Three times a day (TID) | ORAL | Status: DC | PRN

## 2016-01-05 MED ORDER — BUSPIRONE HCL 10 MG PO TABS
15.0000 mg | ORAL_TABLET | Freq: Two times a day (BID) | ORAL | Status: DC
  Administered 2016-01-05: 15 mg via ORAL
  Filled 2016-01-05: qty 2

## 2016-01-05 MED ORDER — LORATADINE 10 MG PO TABS
10.0000 mg | ORAL_TABLET | Freq: Every evening | ORAL | Status: DC
  Filled 2016-01-05: qty 1

## 2016-01-05 MED ORDER — TRAZODONE HCL 100 MG PO TABS
100.0000 mg | ORAL_TABLET | Freq: Every day | ORAL | Status: DC
  Administered 2016-01-05 – 2016-01-06 (×2): 100 mg via ORAL
  Filled 2016-01-05 (×3): qty 1

## 2016-01-05 MED ORDER — ZOLPIDEM TARTRATE 5 MG PO TABS: 5.0000 mg | ORAL_TABLET | Freq: Every evening | ORAL | Status: DC | PRN

## 2016-01-05 MED ORDER — ACETAMINOPHEN 325 MG PO TABS: 650.0000 mg | ORAL_TABLET | Freq: Four times a day (QID) | ORAL | Status: DC | PRN

## 2016-01-05 MED ORDER — CARBAMAZEPINE 200 MG PO TABS: 200.0000 mg | ORAL_TABLET | Freq: Two times a day (BID) | ORAL | Status: DC

## 2016-01-05 MED ORDER — ACETAMINOPHEN 325 MG PO TABS: 650.0000 mg | ORAL_TABLET | ORAL | Status: DC | PRN

## 2016-01-05 MED ORDER — ALBUTEROL SULFATE HFA 108 (90 BASE) MCG/ACT IN AERS: 2.0000 | INHALATION_SPRAY | Freq: Four times a day (QID) | RESPIRATORY_TRACT | Status: DC | PRN

## 2016-01-05 NOTE — Progress Notes (Signed)
Admission Note:  D: Pt appeared depressed  With  a flat affect.  Pt states suicidal . Patient had been saving medication , hoarding them . Stated she was going to take an overdose or walk in traffic.  A: Pt admitted to unit per protocol, skin assessment tattoo on right wrist right pelvis a and search done and no contraband found.  Pt  educated on therapeutic milieu rules. Pt was introduced to milieu by nursing staff.    R: Pt was receptive to education about the milieu .  15 min safety checks started. Probation officer offered support

## 2016-01-05 NOTE — ED Notes (Signed)
[facility] transport has been called for transport to [facility]. Will continue to monitor.

## 2016-01-05 NOTE — Progress Notes (Signed)
Patient currently in the dayroom with staff and peers. Alert and oriented with flat affect. Denies suicidal/homicidal thoughts. Denies hallucinations. No sign of discomfort noted. Safety maintained on the unit.
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
## 2016-01-05 NOTE — Plan of Care (Signed)
Problem: Education: Goal: Knowledge of [place] [facility] Education information/materials will improve Outcome: Progressing Given material for admission, verbalize understanding

## 2016-01-05 NOTE — ED Notes (Signed)
Pt belongings moved from locker 32 to locker 26

## 2016-01-05 NOTE — ED Notes (Addendum)
Pt belongings placed in [facility] locker 83.

## 2016-01-05 NOTE — ED Notes (Signed)
Bed: WA32 Expected date:  Expected time:  Means of arrival:  Comments: 

## 2016-01-05 NOTE — Progress Notes (Signed)
Patient has been accepted to [facility].  Accepting physician is Dr. Dwyane Dee.  Attending Physician will be Dr.Hernandez.  Patient has been assigned to room 303-A, by [place].  Call report to [phone].   [facility] ER Staff , TTS made aware of acceptance. Talor Desrosiers K. Nash Shearer, LPC-A, [facility]  Counselor 01/05/2016 12:54 PM

## 2016-01-05 NOTE — BH Assessment (Signed)
[place] Assessment Progress Note  Per Corena Pilgrim, MD, this pt requires psychiatric hospitalization.  Ria Comment, RN, [facility] reports that pt has been accepted to [facility] by Dr Jerilee Hoh to Rm 303A.  Pt has signed Voluntary Admission and Consent for Treatment, as well as Consent to Release Information to her spouse and her mother, and signed documents have been faxed to [phone].  Pt's nurse has been notified, and agrees to call report to [phone].  Pt is to be transported via Stacey Drain, [place] Triage Specialist [phone]

## 2016-01-05 NOTE — Tx Team (Signed)
Initial Treatment Plan 01/05/2016 6:31 PM Candace Browning [id number]    PATIENT STRESSORS: Medication change or noncompliance Traumatic event   PATIENT STRENGTHS: Ability for insight Capable of independent living Communication skills Supportive family/friends   PATIENT IDENTIFIED PROBLEMS: 1. Suicidal 10.18.17  2. Depression 10.18.17  3. Bipolar Disorder 10.18.17                 DISCHARGE CRITERIA:  Ability to meet basic life and health needs Improved stabilization in mood, thinking, and/or behavior  PRELIMINARY DISCHARGE PLAN: Outpatient therapy Return to previous living arrangement  PATIENT/FAMILY INVOLVEMENT: This treatment plan has been presented to and reviewed with the patient, Candace Browning, and/or family member,  The patient and family have been given the opportunity to ask questions and make suggestions.  Leodis Liverpool, RN 01/05/2016, 6:31 PM

## 2016-01-06 DIAGNOSIS — F3130 Bipolar disorder, current episode depressed, mild or moderate severity, unspecified: Secondary | ICD-10-CM

## 2016-01-06 DIAGNOSIS — F429 Obsessive-compulsive disorder, unspecified: Secondary | ICD-10-CM | POA: Diagnosis present

## 2016-01-06 MED ORDER — BUSPIRONE HCL 5 MG PO TABS
15.0000 mg | ORAL_TABLET | Freq: Two times a day (BID) | ORAL | Status: DC
  Administered 2016-01-06 – 2016-01-08 (×5): 15 mg via ORAL
  Filled 2016-01-06 (×5): qty 3

## 2016-01-06 MED ORDER — ALBUTEROL SULFATE HFA 108 (90 BASE) MCG/ACT IN AERS
1.0000 | INHALATION_SPRAY | Freq: Four times a day (QID) | RESPIRATORY_TRACT | Status: DC | PRN
  Filled 2016-01-06: qty 6.7

## 2016-01-06 MED ORDER — ENSURE ENLIVE PO LIQD
237.0000 mL | Freq: Two times a day (BID) | ORAL | Status: DC
  Administered 2016-01-07 – 2016-01-08 (×3): 237 mL via ORAL

## 2016-01-06 MED ORDER — ZIPRASIDONE HCL 20 MG PO CAPS
40.0000 mg | ORAL_CAPSULE | Freq: Two times a day (BID) | ORAL | Status: DC
  Administered 2016-01-06 – 2016-01-07 (×2): 40 mg via ORAL
  Filled 2016-01-06 (×2): qty 2

## 2016-01-06 MED ORDER — TRAZODONE HCL 50 MG PO TABS
ORAL_TABLET | ORAL | Status: AC
  Filled 2016-01-06: qty 2

## 2016-01-06 MED ORDER — FLUVOXAMINE MALEATE 50 MG PO TABS
50.0000 mg | ORAL_TABLET | Freq: Every day | ORAL | Status: DC
  Administered 2016-01-06: 50 mg via ORAL
  Filled 2016-01-06: qty 1

## 2016-01-06 NOTE — Progress Notes (Signed)
Patient currently visiting with a family member. Alert and oriented, calm and cooperative. Patient mentioned that her diet needs to be taken in consideration " I am supposed to eat gluten free meals and I have not seen any menu..I have not been eating because there is no food for me..."  Charge nurse was informed for follow up.  Patient otherwise remains cooperative. Safety and security maintained.

## 2016-01-06 NOTE — H&P (Signed)
Psychiatric Admission Assessment Adult  Patient Identification: Candace Browning MRN:  [id number] Date of Evaluation:  01/06/2016 Chief Complaint:  Bipolar 1 Principal Diagnosis: Bipolar I disorder, most recent episode depressed ([place]) Diagnosis:   Patient Active Problem List   Diagnosis Date Noted  . OCD (obsessive compulsive disorder) [F42.9] 01/06/2016  . Suicidal ideation [R45.851] 01/05/2016  . Acute sinusitis [J01.90] 12/21/2015  . GERD (gastroesophageal reflux disease) [K21.9] 07/16/2015  . Seasonal allergic conjunctivitis [H10.45] 06/23/2015  . Bipolar I disorder, most recent episode depressed ([place]) [F31.30]   . Mild persistent asthma [J45.30] 01/11/2015  . Allergic rhinitis [J30.89] 01/11/2015  . Food allergy [T78.00XA] 01/11/2015  . Atopic dermatitis [L20.9] 01/11/2015   History of Present Illness:   Identifying data. Candace Browning is a 35 year old female with history of bipolar depression.  Chief complaint. "I just want to die."  History of present illness. Information was obtained from the patient and the chart. The patient has a long history of bipolar depression diagnosed in 2006. She has been seeing her psychiatrist at [facility] regularly. 6 weeks ago she was started on a combination of low-dose lithium and Tegretol and high-dose BuSpar. Reportedly the patient was unable to tolerate higher dose of lithium. Initially she felt great in the past weeks however she became increasingly depressed with poor sleep, decreased appetite, anhedonia, feeling of guilt and hopelessness worthlessness, poor energy and concentration, social isolation, crying spells, and heightened anxiety. She started collecting her Xanax in order to overdose. She reports that she has been taking other medications as prescribed. She told her husband about her plan and they threw away collected pills. She came to the hospital for help. The patient denies frank psychotic symptoms but oftentimes feels paranoid. She  has infrequent panic attacks. She reports symptoms suggestive of PTSD with hypervigilance and nightmares. She also has OCD with excessive worries, ruminations including suicidal thoughts, and excessive cleaning and organizing. She denies alcohol or substance use.  Past psychiatric history. She was diagnosed in 2006. She has been hospitalized 4 times for depression and suicide since. She attempted suicide by cutting and 2006 and by overdose in February 2017. She has been tried on numerous medications including Depakote, Risperdal, Seroquel, Lamictal, Abilify, Latuda, Tegretol, lithium, BuSpar, and Xanax. She experienced unacceptable side effects from all of them, mostly somnolence. She did well on Risperdal but developed galactorrhea. She is in weekly psychotherapy but no longer is able to afford it as frequently and sees her therapist every 3-4 weeks.  Family psychiatric history. Nonreported.  Social history. She lives with her husband and her 55 year old son. 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 She had to stop due to very long working hours. She now works at [employer] not very happy about her job.  Total Time spent with patient: 1 hour  Is the patient at risk to self? Yes.    Has the patient been a risk to self in the past 6 months? Yes.    Has the patient been a risk to self within the distant past? No.  Is the patient a risk to others? No.  Has the patient been a risk to others in the past 6 months? No.  Has the patient been a risk to others within the distant past? No.   Prior Inpatient Therapy:   Prior Outpatient Therapy:    Alcohol Screening: 1. How often do you have a drink containing alcohol?: Monthly or less 2. How many drinks  containing alcohol do you have on a typical day when you are drinking?: 1 or 2 3. How often do you have six or more drinks on one occasion?: Never Preliminary Score: 0 4. How often during the last year have you found that  you were not able to stop drinking once you had started?: Never 5. How often during the last year have you failed to do what was normally expected from you becasue of drinking?: Never 6. How often during the last year have you needed a first drink in the morning to get yourself going after a heavy drinking session?: Never 7. How often during the last year have you had a feeling of guilt of remorse after drinking?: Never 8. How often during the last year have you been unable to remember what happened the night before because you had been drinking?: Never 9. Have you or someone else been injured as a result of your drinking?: No 10. Has a relative or friend or a doctor or another health worker been concerned about your drinking or suggested you cut down?: No Alcohol Use Disorder Identification Test Final Score (AUDIT): 1 Brief Intervention: AUDIT score less than 7 or less-screening does not suggest unhealthy drinking-brief intervention not indicated Substance Abuse History in the last 12 months:  No. Consequences of Substance Abuse: NA Previous Psychotropic Medications: Yes  Psychological Evaluations: No  Past Medical History:  Past Medical History:  Diagnosis Date  . Allergic rhinitis   . Anemia   . Anxiety   . Asthma   . Atopic dermatitis   . Bipolar 1 disorder ([place])   . Eczema   . Food allergy   . Herpes simplex without mention of complication    HSV2  . Renal disorder     Past Surgical History:  Procedure Laterality Date  . WISDOM TOOTH EXTRACTION     Family History:  Family History  Problem Relation Age of Onset  . Asthma Mother   . Anemia Mother   . Arthritis Mother     HAD KNEE REPLACEMENT  . Diabetes Maternal Grandmother   . Arthritis Maternal Grandmother     KNEE REPLACEMENT   Tobacco Screening: Have you used any form of tobacco in the last 30 days? (Cigarettes, Smokeless Tobacco, Cigars, and/or Pipes): No Social History:  History  Alcohol Use  . 2.4 oz/week  .  4 Glasses of wine per week     History  Drug Use No    Additional Social History:                           Allergies:   Allergies  Allergen Reactions  . Apple Anaphylaxis  . Coconut Oil   . Gluten Meal     All breads  . Latex   . Nsaids Other (See Comments)    Thin basement membrane disease, advised to avoid NSAIDS  . Other     PT HAD FOOD ALLERGIES:APPLES, WHEAT, AND all nuts  . Peanut-Containing Drug Products   . Sulfa Antibiotics   . Hydrogen Peroxide Itching and Rash  . Penicillins Rash    Has patient had a PCN reaction causing immediate rash, facial/tongue/throat swelling, SOB or lightheadedness with hypotension: yes  Has patient had a PCN reaction causing severe rash involving mucus membranes or skin necrosis: no Has patient had a PCN reaction that required hospitalization: no  Has patient had a PCN reaction occurring within the last 10 years: no If  all of the above answers are "NO", then may proceed with Cephalosporin use.   . Wheat Bran Rash   Lab Results:  Results for orders placed or performed during the hospital encounter of 01/04/16 (from the past 48 hour(s))  Comprehensive metabolic panel     Status: Abnormal   Collection Time: 01/04/16 11:27 PM  Result Value Ref Range   Sodium 138 135 - 145 mmol/L   Potassium 3.3 (L) 3.5 - 5.1 mmol/L   Chloride 107 101 - 111 mmol/L   CO2 24 22 - 32 mmol/L   Glucose, Bld 102 (H) 65 - 99 mg/dL   BUN 9 6 - 20 mg/dL   Creatinine, Ser 0.93 0.44 - 1.00 mg/dL   Calcium 9.1 8.9 - 10.3 mg/dL   Total Protein 7.4 6.5 - 8.1 g/dL   Albumin 4.1 3.5 - 5.0 g/dL   AST 18 15 - 41 U/L   ALT 14 14 - 54 U/L   Alkaline Phosphatase 77 38 - 126 U/L   Total Bilirubin 0.6 0.3 - 1.2 mg/dL   GFR calc non Af Amer >60 >60 mL/min   GFR calc Af Amer >60 >60 mL/min    Comment: (NOTE) The eGFR has been calculated using the CKD EPI equation. This calculation has not been validated in all clinical situations. eGFR's persistently <60  mL/min signify possible Chronic Kidney Disease.    Anion gap 7 5 - 15  cbc     Status: Abnormal   Collection Time: 01/04/16 11:27 PM  Result Value Ref Range   WBC 8.0 4.0 - 10.5 K/uL   RBC 3.96 3.87 - 5.11 MIL/uL   Hemoglobin 11.7 (L) 12.0 - 15.0 g/dL   HCT 35.1 (L) 36.0 - 46.0 %   MCV 88.6 78.0 - 100.0 fL   MCH 29.5 26.0 - 34.0 pg   MCHC 33.3 30.0 - 36.0 g/dL   RDW 11.8 11.5 - 15.5 %   Platelets 308 150 - 400 K/uL  Ethanol     Status: None   Collection Time: 01/04/16 11:29 PM  Result Value Ref Range   Alcohol, Ethyl (B) <5 <5 mg/dL    Comment:        LOWEST DETECTABLE LIMIT FOR SERUM ALCOHOL IS 5 mg/dL FOR MEDICAL PURPOSES ONLY   Salicylate level     Status: None   Collection Time: 01/04/16 11:29 PM  Result Value Ref Range   Salicylate Lvl <1.7 2.8 - 30.0 mg/dL  Acetaminophen level     Status: Abnormal   Collection Time: 01/04/16 11:29 PM  Result Value Ref Range   Acetaminophen (Tylenol), Serum <10 (L) 10 - 30 ug/mL    Comment:        THERAPEUTIC CONCENTRATIONS VARY SIGNIFICANTLY. A RANGE OF 10-30 ug/mL MAY BE AN EFFECTIVE CONCENTRATION FOR MANY PATIENTS. HOWEVER, SOME ARE BEST TREATED AT CONCENTRATIONS OUTSIDE THIS RANGE. ACETAMINOPHEN CONCENTRATIONS >150 ug/mL AT 4 HOURS AFTER INGESTION AND >50 ug/mL AT 12 HOURS AFTER INGESTION ARE OFTEN ASSOCIATED WITH TOXIC REACTIONS.   hCG, quantitative, pregnancy     Status: None   Collection Time: 01/04/16 11:29 PM  Result Value Ref Range   hCG, Beta Chain, Quant, S <1 <5 mIU/mL    Comment:          GEST. AGE      CONC.  (mIU/mL)   <=1 WEEK        5 - 50     2 WEEKS       50 - 500  3 WEEKS       100 - 10,000     4 WEEKS     1,000 - 30,000     5 WEEKS     3,500 - 115,000   6-8 WEEKS     12,000 - 270,000    12 WEEKS     15,000 - 220,000        FEMALE AND NON-PREGNANT FEMALE:     LESS THAN 5 mIU/mL   Rapid urine drug screen (hospital performed)     Status: None   Collection Time: 01/04/16 11:40 PM  Result Value  Ref Range   Opiates NONE DETECTED NONE DETECTED   Cocaine NONE DETECTED NONE DETECTED   Benzodiazepines NONE DETECTED NONE DETECTED   Amphetamines NONE DETECTED NONE DETECTED   Tetrahydrocannabinol NONE DETECTED NONE DETECTED   Barbiturates NONE DETECTED NONE DETECTED    Comment:        DRUG SCREEN FOR MEDICAL PURPOSES ONLY.  IF CONFIRMATION IS NEEDED FOR ANY PURPOSE, NOTIFY LAB WITHIN 5 DAYS.        LOWEST DETECTABLE LIMITS FOR URINE DRUG SCREEN Drug Class       Cutoff (ng/mL) Amphetamine      1000 Barbiturate      200 Benzodiazepine   458 Tricyclics       099 Opiates          300 Cocaine          300 THC              50     Blood Alcohol level:  Lab Results  Component Value Date   ETH <5 01/04/2016   ETH <5 83/38/2505    Metabolic Disorder Labs:  Lab Results  Component Value Date   HGBA1C 5.4 05/16/2015   MPG 108 05/16/2015   No results found for: PROLACTIN Lab Results  Component Value Date   CHOL 189 05/16/2015   TRIG 42 05/16/2015   HDL 61 05/16/2015   CHOLHDL 3.1 05/16/2015   VLDL 8 05/16/2015   LDLCALC 120 (H) 05/16/2015    Current Medications: Current Facility-Administered Medications  Medication Dose Route Frequency Provider Last Rate Last Dose  . acetaminophen (TYLENOL) tablet 650 mg  650 mg Oral Q6H PRN Chinwe Lope B Britt Theard, MD      . albuterol (PROVENTIL HFA;VENTOLIN HFA) 108 (90 Base) MCG/ACT inhaler 1-2 puff  1-2 puff Inhalation Q6H PRN Alethia Melendrez B Gesselle Fitzsimons, MD      . alum & mag hydroxide-simeth (MAALOX/MYLANTA) 200-200-20 MG/5ML suspension 30 mL  30 mL Oral Q4H PRN Culver Feighner B Shakenya Stoneberg, MD      . busPIRone (BUSPAR) tablet 15 mg  15 mg Oral BID Lynee Rosenbach B Nashton Belson, MD      . fluvoxaMINE (LUVOX) tablet 50 mg  50 mg Oral QHS Kynadie Yaun B Evertt Chouinard, MD      . hydrOXYzine (ATARAX/VISTARIL) tablet 25 mg  25 mg Oral TID PRN Susie Ehresman B Juhi Lagrange, MD      . magnesium hydroxide (MILK OF MAGNESIA) suspension 30 mL  30 mL Oral Daily PRN Caytlyn Evers B  Ericca Labra, MD      . traZODone (DESYREL) tablet 100 mg  100 mg Oral QHS Nikkia Devoss B Tomeko Scoville, MD   100 mg at 01/05/16 2137  . ziprasidone (GEODON) capsule 40 mg  40 mg Oral BID WC Willisha Sligar B Kalynne Womac, MD       PTA Medications: Facility-Administered Medications Prior to Admission  Medication Dose Route Frequency Provider Last Rate Last Dose  . predniSONE (  DELTASONE) tablet 10 mg  10 mg Oral Q breakfast Adelina Mings, MD       Prescriptions Prior to Admission  Medication Sig Dispense Refill Last Dose  . albuterol (PROVENTIL HFA;VENTOLIN HFA) 108 (90 Base) MCG/ACT inhaler Inhale 2 puffs into the lungs every 6 (six) hours as needed.   unknown at unknown  . ALPRAZolam (XANAX) 0.25 MG tablet Take 0.25 mg by mouth as needed.   01/03/2016 at Unknown time  . azelastine (OPTIVAR) 0.05 % ophthalmic solution Place 1 drop into both eyes 2 (two) times daily. (Patient not taking: Reported on 01/04/2016) 6 mL 5 Not Taking at Unknown time  . Azelastine HCl 0.15 % SOLN Place 2 sprays into both nostrils 2 (two) times daily. (Patient not taking: Reported on 01/04/2016) 30 mL 5 Not Taking at Unknown time  . busPIRone (BUSPAR) 15 MG tablet Take 15 mg by mouth 2 (two) times daily.    01/04/2016 at Unknown time  . Carbamazepine (EQUETRO) 100 MG CP12 12 hr capsule Take 1 capsule (100 mg total) by mouth 2 (two) times daily. (Patient not taking: Reported on 01/04/2016) 60 each 0 Not Taking at Unknown time  . carbamazepine (TEGRETOL) 200 MG tablet Take 200 mg by mouth 2 (two) times daily.   01/02/2016 at unknown time  . Epinastine HCl (ELESTAT) 0.05 % ophthalmic solution Place 1 drop into both eyes 2 (two) times daily. (Patient not taking: Reported on 01/04/2016) 10 mL 12 Not Taking at Unknown time  . EPINEPHrine 0.3 mg/0.3 mL IJ SOAJ injection Inject 0.3 mg into the muscle as needed.   not used at unknown  . fluticasone (FLONASE) 50 MCG/ACT nasal spray Place 2 sprays into both nostrils daily. 16 g 3 unknown at  unknown  . levocetirizine (XYZAL) 5 MG tablet TAKE ONE TABLET BY MOUTH EVERY EVENING 30 tablet 0   . lithium carbonate 300 MG capsule Take 300 mg by mouth at bedtime.    01/03/2016 at Unknown time  . lurasidone (LATUDA) 40 MG TABS tablet Take 1 tablet (40 mg total) by mouth daily. (Patient not taking: Reported on 12/21/2015) 30 tablet 0 Not Taking  . montelukast (SINGULAIR) 10 MG tablet Take one tablet once daily in the evening to prevent cough or wheeze. (Patient not taking: Reported on 12/21/2015) 30 tablet 5 Not Taking  . Olopatadine HCl (PATADAY) 0.2 % SOLN Apply 1 drop to eye daily. (Patient not taking: Reported on 12/21/2015) 1 Bottle 3 Not Taking  . omeprazole (PRILOSEC OTC) 20 MG tablet Take one daily as directed. (Patient not taking: Reported on 12/21/2015) 30 tablet 5 Not Taking  . valACYclovir (VALTREX) 500 MG tablet Take 1 tablet (500 mg total) by mouth 2 (two) times daily as needed (outbreaks). 30 tablet 0 unknown at unknown    Musculoskeletal: Strength & Muscle Tone: within normal limits Gait & Station: normal Patient leans: N/A  Psychiatric Specialty Exam: Physical Exam  Nursing note and vitals reviewed. Constitutional: She is oriented to person, place, and time. She appears well-developed and well-nourished.  HENT:  Head: Normocephalic and atraumatic.  Eyes: Conjunctivae and EOM are normal. Pupils are equal, round, and reactive to light.  Neck: Normal range of motion. Neck supple.  Cardiovascular: Normal rate, regular rhythm and normal heart sounds.   Respiratory: Effort normal and breath sounds normal.  GI: Soft. Bowel sounds are normal.  Musculoskeletal: Normal range of motion.  Neurological: She is alert and oriented to person, place, and time.  Skin: Skin is warm and  dry.    Review of Systems  HENT: Positive for congestion.   Psychiatric/Behavioral: Positive for depression and suicidal ideas. The patient is nervous/anxious.   All other systems reviewed and are  negative.   Blood pressure 107/75, pulse 89, temperature 98.8 F (37.1 C), temperature source Oral, resp. rate 18, height 5' 6"  (1.676 m), weight 59 kg (130 lb), last menstrual period 01/03/2016, SpO2 99 %.Body mass index is 20.98 kg/m.  See SRA.                                                  Sleep:  Number of Hours: 7.45    Treatment Plan Summary: Daily contact with patient to assess and evaluate symptoms and progress in treatment and Medication management   Ms. Dunkleberger is a 35 year old female with history of bipolar depression admitted for worsening of depression and suicidal thinking with a plan to overdose on medications. The patient started holding Xanax for the past week in order to overdose.  1. Suicidal ideation. The patient is able to contract for safety in the hospital.  2. Mood. She has been maintained lately on low dose lithium, Tegretol and high dose of BuSpar. She believes that the BuSpar has been helpful but wants to discontinue lithium and Tegretol. We will start Geodon for mood stabilization.  3. Anxiety. At the patient reports panic attacks, OCD and PTSD type symptoms. We'll try Luvox tonight for OCD. We will not prescribe Xanax.  4. Asthma. Albuterol is available.  5. Insomnia. Trazodone is available.  6. Disposition. The patient will be discharged to home with family. She will follow up with the Wynema Birch at [place].   Observation Level/Precautions:  15 minute checks  Laboratory:  CBC Chemistry Profile UDS UA  Psychotherapy:    Medications:    Consultations:    Discharge Concerns:    Estimated LOS:  Other:     Physician Treatment Plan for Primary Diagnosis: Bipolar I disorder, most recent episode depressed ([place]) Long Term Goal(s): Improvement in symptoms so as ready for discharge  Short Term Goals: Ability to identify changes in lifestyle to reduce recurrence of condition will improve, Ability to verbalize feelings will  improve, Ability to disclose and discuss suicidal ideas, Ability to demonstrate self-control will improve, Ability to identify and develop effective coping behaviors will improve and Ability to maintain clinical measurements within normal limits will improve  Physician Treatment Plan for Secondary Diagnosis: Principal Problem:   Bipolar I disorder, most recent episode depressed ([place]) Active Problems:   Mild persistent asthma   Suicidal ideation   OCD (obsessive compulsive disorder)  Long Term Goal(s): NA  Short Term Goals: NA  I certify that inpatient services furnished can reasonably be expected to improve the patient's condition.    Orson Slick, MD 10/19/20179:29 AM

## 2016-01-06 NOTE — [facility] Suicide Risk Assessment (Signed)
Candace Browning Admission Suicide Risk Assessment   Nursing information obtained from:    Demographic factors:    Current Mental Status:    Loss Factors:    Historical Factors:    Risk Reduction Factors:     Total Time spent with patient: 1 hour Principal Problem: Bipolar I disorder, most recent episode depressed ([place]) Diagnosis:   Patient Active Problem List   Diagnosis Date Noted  . OCD (obsessive compulsive disorder) [F42.9] 01/06/2016  . Suicidal ideation [R45.851] 01/05/2016  . Acute sinusitis [J01.90] 12/21/2015  . GERD (gastroesophageal reflux disease) [K21.9] 07/16/2015  . Seasonal allergic conjunctivitis [H10.45] 06/23/2015  . Bipolar I disorder, most recent episode depressed ([place]) [F31.30]   . Mild persistent asthma [J45.30] 01/11/2015  . Allergic rhinitis [J30.89] 01/11/2015  . Food allergy [T78.00XA] 01/11/2015  . Atopic dermatitis [L20.9] 01/11/2015   Subjective Data: Depression, suicidal ideation.  Continued Clinical Symptoms:  Alcohol Use Disorder Identification Test Final Score (AUDIT): 1 The "Alcohol Use Disorders Identification Test", Guidelines for Use in Primary Care, Second Edition.  World Pharmacologist [facility]). Score between 0-7:  no or low risk or alcohol related problems. Score between 8-15:  moderate risk of alcohol related problems. Score between 16-19:  high risk of alcohol related problems. Score 20 or above:  warrants further diagnostic evaluation for alcohol dependence and treatment.   CLINICAL FACTORS:   Severe Anxiety and/or Agitation Bipolar Disorder:   Depressive phase Depression:   Hopelessness Impulsivity   Musculoskeletal: Strength & Muscle Tone: within normal limits Gait & Station: normal Patient leans: N/A  Psychiatric Specialty Exam: Physical Exam  Nursing note and vitals reviewed.   Review of Systems  HENT: Positive for congestion.   Psychiatric/Behavioral: Positive for depression and suicidal ideas. The patient is  nervous/anxious.   All other systems reviewed and are negative.   Blood pressure 107/75, pulse 89, temperature 98.8 F (37.1 C), temperature source Oral, resp. rate 18, height 5\' 6"  (1.676 m), weight 59 kg (130 lb), last menstrual period 01/03/2016, SpO2 99 %.Body mass index is 20.98 kg/m.  General Appearance: Casual  Eye Contact:  Good  Speech:  Clear and Coherent  Volume:  Normal  Mood:  Anxious, Depressed and Hopeless  Affect:  Blunt  Thought Process:  Goal Directed and Descriptions of Associations: Intact  Orientation:  Full (Time, Place, and Person)  Thought Content:  WDL  Suicidal Thoughts:  Yes.  with intent/plan  Homicidal Thoughts:  No  Memory:  Immediate;   Fair Recent;   Fair Remote;   Fair  Judgement:  Fair  Insight:  Fair  Psychomotor Activity:  Normal  Concentration:  Concentration: Fair and Attention Span: Fair  Recall:  [employer] of Knowledge:  Fair  Language:  Fair  Akathisia:  No  Handed:  Right  AIMS (if indicated):     Assets:  Communication Skills Desire for Improvement Financial Resources/Insurance [place] Talents/Skills Transportation Vocational/Educational  ADL's:  Intact  Cognition:  WNL  Sleep:  Number of Hours: 7.45      COGNITIVE FEATURES THAT CONTRIBUTE TO RISK:  None    SUICIDE RISK:   Severe:  Frequent, intense, and enduring suicidal ideation, specific plan, no subjective intent, but some objective markers of intent (i.e., choice of lethal method), the method is accessible, some limited preparatory behavior, evidence of impaired self-control, severe dysphoria/symptomatology, multiple risk factors present, and few if any protective factors, particularly a lack of social support.   PLAN OF CARE: Hospital admission,  medication management, discharge planning.  Candace Browning is a 35 year old female with history of bipolar depression admitted for worsening of depression and suicidal thinking with  a plan to overdose on medications. The patient started holding Xanax for the past week in order to overdose.  1. Suicidal ideation. The patient is able to contract for safety in the hospital.  2. Mood. She has been maintained lately on low dose lithium, Tegretol and high dose of BuSpar. She believes that the BuSpar has been helpful but wants to discontinue lithium and Tegretol. We will start Geodon for mood stabilization.  3. Anxiety. At the patient reports panic attacks, OCD and PTSD type symptoms. We'll try Luvox tonight for OCD. We will not prescribe Xanax.  4. Asthma. Albuterol is available.  5. Insomnia. Trazodone is available.  6. Disposition. The patient will be discharged to home with family. She will follow up with Wynema Birch at [facility].  I certify that inpatient services furnished can reasonably be expected to improve the patient's condition.  Orson Slick, MD 01/06/2016, 9:22 AM

## 2016-01-06 NOTE — [facility] Counselor (Signed)
CSW attempted PSA, but pt presented as too acute, as evidenced by pt exhibiting fatigue and as uninterested in completing PSA, CSW will attempt on following day.    Candace Browning. Ivoree Felmlee, LCSWA, LCAS   01/06/16

## 2016-01-06 NOTE — Plan of Care (Signed)
Problem: Activity: Goal: Interest or engagement in leisure activities will improve Outcome: Not Progressing [place] and not talking to anybody in the dayroom

## 2016-01-06 NOTE — Progress Notes (Signed)
Patient had medications then stayed  in the room for a little while. Went to bed and has been sleeping. No sign of discomfort. Emotional support and encouragements provided. Safety maintaned

## 2016-01-06 NOTE — Tx Team (Signed)
Interdisciplinary Treatment and Diagnostic Plan Update  01/06/2016 Time of Session: 10:30am Candace Browning MRN: [id number]  Principal Diagnosis: Bipolar I disorder, most recent episode depressed ([place])  Secondary Diagnoses: Principal Problem:   Bipolar I disorder, most recent episode depressed ([place]) Active Problems:   Mild persistent asthma   Suicidal ideation   OCD (obsessive compulsive disorder)   Current Medications:  Current Facility-Administered Medications  Medication Dose Route Frequency Provider Last Rate Last Dose  . acetaminophen (TYLENOL) tablet 650 mg  650 mg Oral Q6H PRN Jolanta B Pucilowska, MD      . albuterol (PROVENTIL HFA;VENTOLIN HFA) 108 (90 Base) MCG/ACT inhaler 1-2 puff  1-2 puff Inhalation Q6H PRN Jolanta B Pucilowska, MD      . alum & mag hydroxide-simeth (MAALOX/MYLANTA) 200-200-20 MG/5ML suspension 30 mL  30 mL Oral Q4H PRN Jolanta B Pucilowska, MD      . busPIRone (BUSPAR) tablet 15 mg  15 mg Oral BID Jolanta B Pucilowska, MD   15 mg at 01/06/16 1023  . fluvoxaMINE (LUVOX) tablet 50 mg  50 mg Oral QHS Jolanta B Pucilowska, MD      . hydrOXYzine (ATARAX/VISTARIL) tablet 25 mg  25 mg Oral TID PRN Jolanta B Pucilowska, MD      . magnesium hydroxide (MILK OF MAGNESIA) suspension 30 mL  30 mL Oral Daily PRN Jolanta B Pucilowska, MD      . traZODone (DESYREL) tablet 100 mg  100 mg Oral QHS Jolanta B Pucilowska, MD   100 mg at 01/05/16 2137  . ziprasidone (GEODON) capsule 40 mg  40 mg Oral BID WC Jolanta B Pucilowska, MD       PTA Medications: Facility-Administered Medications Prior to Admission  Medication Dose Route Frequency Provider Last Rate Last Dose  . predniSONE (DELTASONE) tablet 10 mg  10 mg Oral Q breakfast Adelina Mings, MD       Prescriptions Prior to Admission  Medication Sig Dispense Refill Last Dose  . albuterol (PROVENTIL HFA;VENTOLIN HFA) 108 (90 Base) MCG/ACT inhaler Inhale 2 puffs into the lungs every 6 (six) hours as needed.    unknown at unknown  . ALPRAZolam (XANAX) 0.25 MG tablet Take 0.25 mg by mouth as needed.   01/03/2016 at Unknown time  . azelastine (OPTIVAR) 0.05 % ophthalmic solution Place 1 drop into both eyes 2 (two) times daily. (Patient not taking: Reported on 01/04/2016) 6 mL 5 Not Taking at Unknown time  . Azelastine HCl 0.15 % SOLN Place 2 sprays into both nostrils 2 (two) times daily. (Patient not taking: Reported on 01/04/2016) 30 mL 5 Not Taking at Unknown time  . busPIRone (BUSPAR) 15 MG tablet Take 15 mg by mouth 2 (two) times daily.    01/04/2016 at Unknown time  . Carbamazepine (EQUETRO) 100 MG CP12 12 hr capsule Take 1 capsule (100 mg total) by mouth 2 (two) times daily. (Patient not taking: Reported on 01/04/2016) 60 each 0 Not Taking at Unknown time  . carbamazepine (TEGRETOL) 200 MG tablet Take 200 mg by mouth 2 (two) times daily.   01/02/2016 at unknown time  . Epinastine HCl (ELESTAT) 0.05 % ophthalmic solution Place 1 drop into both eyes 2 (two) times daily. (Patient not taking: Reported on 01/04/2016) 10 mL 12 Not Taking at Unknown time  . EPINEPHrine 0.3 mg/0.3 mL IJ SOAJ injection Inject 0.3 mg into the muscle as needed.   not used at unknown  . fluticasone (FLONASE) 50 MCG/ACT nasal spray Place 2 sprays into both nostrils  daily. 16 g 3 unknown at unknown  . levocetirizine (XYZAL) 5 MG tablet TAKE ONE TABLET BY MOUTH EVERY EVENING 30 tablet 0   . lithium carbonate 300 MG capsule Take 300 mg by mouth at bedtime.    01/03/2016 at Unknown time  . lurasidone (LATUDA) 40 MG TABS tablet Take 1 tablet (40 mg total) by mouth daily. (Patient not taking: Reported on 12/21/2015) 30 tablet 0 Not Taking  . montelukast (SINGULAIR) 10 MG tablet Take one tablet once daily in the evening to prevent cough or wheeze. (Patient not taking: Reported on 12/21/2015) 30 tablet 5 Not Taking  . Olopatadine HCl (PATADAY) 0.2 % SOLN Apply 1 drop to eye daily. (Patient not taking: Reported on 12/21/2015) 1 Bottle 3 Not  Taking  . omeprazole (PRILOSEC OTC) 20 MG tablet Take one daily as directed. (Patient not taking: Reported on 12/21/2015) 30 tablet 5 Not Taking  . valACYclovir (VALTREX) 500 MG tablet Take 1 tablet (500 mg total) by mouth 2 (two) times daily as needed (outbreaks). 30 tablet 0 unknown at unknown    Patient Stressors: Medication change or noncompliance Traumatic event  Patient Strengths: Ability for insight Capable of independent living Communication skills Supportive family/friends  Treatment Modalities: Medication Management, Group therapy, Case management,  1 to 1 session with clinician, Psychoeducation, Recreational therapy.   Physician Treatment Plan for Primary Diagnosis: Bipolar I disorder, most recent episode depressed ([place]) Long Term Goal(s): Improvement in symptoms so as ready for discharge NA   Short Term Goals: Ability to identify changes in lifestyle to reduce recurrence of condition will improve Ability to verbalize feelings will improve Ability to disclose and discuss suicidal ideas Ability to demonstrate self-control will improve Ability to identify and develop effective coping behaviors will improve Ability to maintain clinical measurements within normal limits will improve NA  Medication Management: Evaluate patient's response, side effects, and tolerance of medication regimen.  Therapeutic Interventions: 1 to 1 sessions, Unit Group sessions and Medication administration.  Evaluation of Outcomes: Progressing  Physician Treatment Plan for Secondary Diagnosis: Principal Problem:   Bipolar I disorder, most recent episode depressed ([place]) Active Problems:   Mild persistent asthma   Suicidal ideation   OCD (obsessive compulsive disorder)  Long Term Goal(s): Improvement in symptoms so as ready for discharge NA   Short Term Goals: Ability to identify changes in lifestyle to reduce recurrence of condition will improve Ability to verbalize feelings will  improve Ability to disclose and discuss suicidal ideas Ability to demonstrate self-control will improve Ability to identify and develop effective coping behaviors will improve Ability to maintain clinical measurements within normal limits will improve NA     Medication Management: Evaluate patient's response, side effects, and tolerance of medication regimen.  Therapeutic Interventions: 1 to 1 sessions, Unit Group sessions and Medication administration.  Evaluation of Outcomes: Progressing   RN Treatment Plan for Primary Diagnosis: Bipolar I disorder, most recent episode depressed ([employer]) Long Term Goal(s): Knowledge of disease and therapeutic regimen to maintain health will improve  Short Term Goals: Ability to remain free from injury will improve, Ability to verbalize feelings will improve, Ability to disclose and discuss suicidal ideas, Ability to identify and develop effective coping behaviors will improve and Compliance with prescribed medications will improve  Medication Management: RN will administer medications as ordered by provider, will assess and evaluate patient's response and provide education to patient for prescribed medication. RN will report any adverse and/or side effects to prescribing provider.  Therapeutic Interventions: 1 on 1  counseling sessions, Psychoeducation, Medication administration, Evaluate responses to treatment, Monitor vital signs and CBGs as ordered, Perform/monitor CIWA, COWS, AIMS and Fall Risk screenings as ordered, Perform wound care treatments as ordered.  Evaluation of Outcomes: Progressing   LCSW Treatment Plan for Primary Diagnosis: Bipolar I disorder, most recent episode depressed ([place]) Long Term Goal(s): Safe transition to appropriate next level of care at discharge, Engage patient in therapeutic group addressing interpersonal concerns.  Short Term Goals: Engage patient in aftercare planning with referrals and resources, Increase social  support, Increase ability to appropriately verbalize feelings, Increase emotional regulation, Facilitate acceptance of mental health diagnosis and concerns and Facilitate patient progression through stages of change regarding substance use diagnoses and concerns  Therapeutic Interventions: Assess for all discharge needs, 1 to 1 time with Social worker, Explore available resources and support systems, Assess for adequacy in community support network, Educate family and significant other(s) on suicide prevention, Complete Psychosocial Assessment, Interpersonal group therapy.  Evaluation of Outcomes: Progressing   Progress in Treatment: Attending groups: No. CSW still assessing, pt new to milieu Participating in groups: No.  CSW still assessing, pt new to milieu Taking medication as prescribed: Yes. Toleration medication: Yes. Family/Significant other contact made: No, will contact:  pt's family Patient understands diagnosis: Yes. Discussing patient identified problems/goals with staff: Yes. Medical problems stabilized or resolved: Yes. Denies suicidal/homicidal ideation: Yes. Issues/concerns per patient self-inventory: No. Other: None  New problem(s) identified: No, Describe:  pt listed none  New Short Term/Long Term Goal(s):  Discharge Plan or Barriers: CSW still assessing for appropriate referrals   Reason for Continuation of Hospitalization: Aggression Anxiety Depression Suicidal ideation  Estimated Length of Stay:   Attendees: Patient:  Candace Browning 01/06/2016 10:57 AM  Physician: Dr. Bary Leriche, MD 01/06/2016 10:57 AM  Nursing: Elige Radon, RN 01/06/2016 10:57 AM  RN Care Manager: 01/06/2016 10:57 AM  Social Worker: [name]. Daine Gravel, LCAS 01/06/2016 10:57 AM  Recreational Therapist: Everitt Amber, LRT 01/06/2016 10:57 AM  Other:  01/06/2016 10:57 AM  Other:  01/06/2016 10:57 AM  Other: 01/06/2016 10:57 AM    Scribe for Treatment Team: [name] Shakeira Rhee,  LCSWA 01/06/2016

## 2016-01-06 NOTE — Progress Notes (Signed)
Affect flat , irritable   Voice concerns around not receiving her medication of Lithium and Buspar.  Patient stated slept good last night .Stated appetite is good and energy level  Is normal. Stated concentration is good . Stated on Depression scale , 5 hopeless 4 and anxiety7 .( low 0-10 high) Denies suicidal  homicidal ideations  .  No auditory hallucinations  No pain concerns . Appropriate ADL'S. Interacting with peers and staff.   A: Encourage patient participation with unit programming . Instruction  Given on  Medication , verbalize understanding. R: Voice no other concerns. Staff continue to monitor

## 2016-01-06 NOTE — Progress Notes (Signed)
NUTRITION ASSESSMENT  Pt identified as at risk on the Malnutrition Screen Tool  INTERVENTION: 1. Educated patient on the importance of nutrition and encouraged intake of food and beverages. 2. Discussed weight goals. 3. Supplements: Ensure Enlive po BID, each supplement provides 350 kcal and 20 grams of protein   NUTRITION DIAGNOSIS: Unintentional weight loss related to sub-optimal intake as evidenced by pt report.   Goal: Pt to meet >/= 90% of their estimated nutrition needs.  Monitor:  PO intake  Assessment:  Patient reports her appetite has been poor for about 1 week now and she is only eating 2 meals per day. She reports UBW is 137 lbs and that she has lost 7 lbs (5% body weight) over 1 week, which is significant for time frame. Also endorses nausea during this time, but no emesis. Patient amenable to drinking Ensure Enlive BID in between meals to help meet protein and calorie needs and prevent further weight loss. She reports she is also allergic to peanuts, so cannot have the peanut butter.  35 y.o. female  Height: Ht Readings from Last 1 Encounters:  01/05/16 5\' 6"  (1.676 m)    Weight: Wt Readings from Last 1 Encounters:  01/05/16 130 lb (59 kg)    Weight Hx: Wt Readings from Last 10 Encounters:  01/05/16 130 lb (59 kg)  01/04/16 137 lb (62.1 kg)  12/21/15 135 lb 6.4 oz (61.4 kg)  05/14/15 131 lb (59.4 kg)  01/11/15 130 lb (59 kg)  08/03/13 140 lb (63.5 kg)  12/13/11 161 lb (73 kg)    BMI:  Body mass index is 20.98 kg/m. Pt meets criteria for Normal Weight based on current BMI.  Estimated Nutritional Needs: Kcal: 25-30 kcal/kg Protein: > 1 gram protein/kg Fluid: 1 ml/kcal  Diet Order: Diet gluten free Room service appropriate? Yes; Fluid consistency: Thin Pt is also offered choice of unit snacks mid-morning and mid-afternoon. She cannot have the graham crackers or saltine crackers since she is gluten-free, but is fine with cheerios.  Pt is eating as  desired.   Lab results and medications reviewed.   Willey Blade, MS, RD, LDN Pager: [phone] After Hours Pager: [phone]

## 2016-01-06 NOTE — BHH Group Notes (Signed)
[facility] Group Notes:  (Nursing/MHT/Case Management/Adjunct)  Date:  01/06/2016  Time:  10:14 PM  Type of Therapy:  Psychoeducational Skills  Participation Level:  Active  Participation Quality:  Appropriate  Affect:  Appropriate  Cognitive:  Alert  Insight:  Good  Engagement in Group:  Developing/Improving  Modes of Intervention:  Activity  Summary of Progress/Problems:  Candace Browning 01/06/2016, 10:14 PM

## 2016-01-06 NOTE — Progress Notes (Signed)
Recreation Therapy Notes  Date: 10.19.17 Time: 1:00 pm Location: Craft Room  Group Topic: Leisure Education  Goal Area(s) Addresses:  Patient will write at least one healthy coping skill. Patient will identify positive emotions.  Behavioral Response: Did not attend  Intervention: Leisure Time Clock  Activity: Patients were instructed to write 2 healthy leisure activities. Patients were then given a Leisure Time Clock worksheet and instructed to complete it. Patients were instructed to identify 10 positive emotions. Patients were instructed to match the healthy leisure activities with the positive emotions.  Education: LRT educated patients on what they need to participate in leisure.  Education Outcome: Patient did not attend group.  Clinical Observations/Feedback: Patient did not attend group.  Leonette Monarch, LRT/CTRS 01/06/2016 2:05 PM

## 2016-01-06 NOTE — BHH Group Notes (Signed)
[facility] Group Notes:  (Nursing/MHT/Case Management/Adjunct)  Date:  01/06/2016  Time:  4:33 PM  Type of Therapy:  Psychoeducational Skills  Participation Level:  Active  Participation Quality:  Appropriate  Affect:  Appropriate  Cognitive:  Appropriate  Insight:  Appropriate  Engagement in Group:  Engaged  Modes of Intervention:  Discussion and Education  Summary of Progress/Problems:  Drake Leach 01/06/2016, 4:33 PM

## 2016-01-06 NOTE — Plan of Care (Signed)
Problem: Coping: Goal: Ability to cope will improve Outcome: Progressing Denies SI/HI

## 2016-01-06 NOTE — BHH Group Notes (Signed)
[facility] LCSW Group Therapy  01/06/2016 11:16 AM  Type of Therapy:  Group Therapy  Participation Level:  Active  Participation Quality:  Appropriate  Affect:  Appropriate  Cognitive:  Alert and Appropriate  Insight:  Developing/Improving and Engaged  Engagement in Therapy:  Engaged  Modes of Intervention:  Discussion, Education and Support  Summary of Progress/Problems:Balance in life: Patients will discuss the concept of balance and how it looks and feels to be unbalanced. Pt will identify areas in their life that is unbalanced and ways to become more balanced. Patient was actively engaged in the group discussion and stated she needs to improve in the areas of her eating habits, hygiene and health management.    Candace Browning G. [place], [place] 01/06/2016, 11:19 AM

## 2016-01-06 NOTE — Progress Notes (Signed)
Recreation Therapy Notes  INPATIENT RECREATION THERAPY ASSESSMENT  Patient Details Name: Candace Browning MRN: [id number] DOB: 14-Apr-1980 Today's Date: 01/06/2016  Patient Stressors: Other (Comment) Tour manager)  Coping Skills:   Isolate, Avoidance, Exercise, Art/Dance, Music, Sports, Other (Comment) (Sleep)  Personal Challenges: Concentration, Decision-Making, Problem-Solving, Self-Esteem/Confidence, Stress Management, Trusting Others  Leisure Interests (2+):  Individual - Other (Comment) (Be outside, camping, kayaking)  Awareness of Community Resources:  Yes  Community Resources:  Park, Other (Comment) [place])  Current Use: No  If no, Barriers?: Other (Comment) (Been depressed)  Patient Strengths:  Good mom, good sister  Patient Identified Areas of Improvement:  Figure out how to deal with stress and anxiety  Current Recreation Participation:  Nothing  Patient Goal for Hospitalization:  Does not have one  [place] of Residence:  [place] of Residence:  [place]   Current [place] (including self-harm):  No  Current HI:  No  Consent to Intern Participation: N/A   Leonette Monarch, LRT/CTRS 01/06/2016, 4:17 PM

## 2016-01-07 MED ORDER — ZIPRASIDONE HCL 40 MG PO CAPS
80.0000 mg | ORAL_CAPSULE | Freq: Two times a day (BID) | ORAL | Status: DC
  Administered 2016-01-07 – 2016-01-08 (×2): 80 mg via ORAL
  Filled 2016-01-07 (×2): qty 2

## 2016-01-07 MED ORDER — FLUVOXAMINE MALEATE 50 MG PO TABS
100.0000 mg | ORAL_TABLET | Freq: Every day | ORAL | Status: DC
  Administered 2016-01-07: 100 mg via ORAL
  Filled 2016-01-07 (×2): qty 2

## 2016-01-07 NOTE — Plan of Care (Signed)
Problem: Safety: Goal: Ability to disclose and discuss suicidal ideas will improve Outcome: Progressing Patient reports that she was recently suicidal. Currently denies SI

## 2016-01-07 NOTE — Plan of Care (Signed)
Problem: Coping: Goal: Ability to cope will improve Outcome: Progressing Patient is now more open, discussing her needs and concerns. Attending groups

## 2016-01-07 NOTE — BHH Group Notes (Signed)
[facility] LCSW Group Therapy   01/06/2016 1pm *Late Entry  Type of Therapy: Group Therapy   Participation Level: Active   Participation Quality: Attentive, Sharing and Supportive   Affect: Appropriate   Cognitive: Alert and Oriented   Insight: Developing/Improving and Engaged   Engagement in Therapy: Developing/Improving and Engaged   Modes of Intervention: Clarification, Confrontation, Discussion, Education, Exploration, Limit-setting, Orientation, Problem-solving, Rapport Building, Art therapist, Socialization and Support   Summary of Progress/Problems: The topic for group was balance in life. Today's group focused on defining balance in one's own words, identifying things that can knock one off balance, and exploring healthy ways to maintain balance in life. Group members were asked to provide an example of a time when they felt off balance, describe how they handled that situation, and process healthier ways to regain balance in the future. Group members were asked to share the most important tool for maintaining balance that they learned while at [facility] and how they plan to apply this method after discharge. was focused and attentive to the sharing of others and topics discussed.  Pt reported that for the pt feeling in balance was "going with" the pt's "plans for the future".  Pt did not share after this but was was focused and attentive to the sharing of others and topics discussed.  Pt presented as guarded and defensive.   [name]. Chellie Vanlue, LCSWA, LCAS

## 2016-01-07 NOTE — Progress Notes (Signed)
Recreation Therapy Notes  Date: 10.20.17 Time: 1:00 pm Location: Craft Room  Group Topic: Coping Skills  Goal Area(s) Addresses:  Patient will effectively work with peer towards shared goal. Patient will identify skills used to make activity successful. Patient will identify benefit of using group skills effectively.  Behavioral Response: Did not attend  Intervention: [employer]  Activity: Patients were given 15 pipe cleaners and were instructed to build a free standing tower. Patients were given 2 minutes to strategize. After approximately 5 minutes patients had started building, patients were instructed to put their dominant hand behind their back. After another 5 minutes, patients were told they could not talk to each other.  Education: LRT educated patients on healthy support systems.  Education Outcome: Patient did not attend group.  Clinical Observations/Feedback: Patient did not attend group.  Leonette Monarch, LRT/CTRS 01/07/2016 1:52 PM
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
## 2016-01-07 NOTE — BHH Group Notes (Signed)
[place] LCSW Group Therapy   01/07/2016  1pm *Late Entry  Type of Therapy: Group Therapy   Participation Level: Did Not Attend. Patient invited to participate but declined.    [name]. Aniza Shor, MSW, LCSWA, LCAS

## 2016-01-07 NOTE — Progress Notes (Signed)
Patient denies depression and suicidal or homicidal ideations.Stated that she excited for going home this weekend.Attended groups.Compliant with medications.Verbalized that she gets tired easily.Attended groups outside.Appetite & energy level fair.Support & encouragement given.Safety maintained.

## 2016-01-07 NOTE — Plan of Care (Signed)
Problem: Coping: Goal: Ability to verbalize frustrations and anger appropriately will improve Outcome: Not Progressing Patient is hesitant to open up with her feelings.

## 2016-01-07 NOTE — Progress Notes (Signed)
[facility] MD Progress Note  01/07/2016 10:38 AM GRATIA FARAR  MRN:  [id number]  Subjective:  Ms. Fesmire was started on a combination of Geodon and Luvox last night. She seems to tolerate it well. There is no excessive sedation. She still feels depressed and ruminates on suicidal thoughts. There are no somatic complaints. Good program participation.  Principal Problem: Bipolar I disorder, most recent episode depressed ([place]) Diagnosis:   Patient Active Problem List   Diagnosis Date Noted  . OCD (obsessive compulsive disorder) [F42.9] 01/06/2016  . Suicidal ideation [R45.851] 01/05/2016  . Acute sinusitis [J01.90] 12/21/2015  . GERD (gastroesophageal reflux disease) [K21.9] 07/16/2015  . Seasonal allergic conjunctivitis [H10.45] 06/23/2015  . Bipolar I disorder, most recent episode depressed ([place]) [F31.30]   . Mild persistent asthma [J45.30] 01/11/2015  . Allergic rhinitis [J30.89] 01/11/2015  . Food allergy [T78.00XA] 01/11/2015  . Atopic dermatitis [L20.9] 01/11/2015   Total Time spent with patient: 20 minutes  Past Psychiatric History: bipolar depression, OCD.  Past Medical History:  Past Medical History:  Diagnosis Date  . Allergic rhinitis   . Anemia   . Anxiety   . Asthma   . Atopic dermatitis   . Bipolar 1 disorder ([place])   . Eczema   . Food allergy   . Herpes simplex without mention of complication    HSV2  . Renal disorder     Past Surgical History:  Procedure Laterality Date  . WISDOM TOOTH EXTRACTION     Family History:  Family History  Problem Relation Age of Onset  . Asthma Mother   . Anemia Mother   . Arthritis Mother     HAD KNEE REPLACEMENT  . Diabetes Maternal Grandmother   . Arthritis Maternal Grandmother     KNEE REPLACEMENT   Family Psychiatric  History: see H&P. Social History:  History  Alcohol Use  . 2.4 oz/week  . 4 Glasses of wine per week     History  Drug Use No    Social History   Social History  . Marital status: Single   Spouse name: N/A  . Number of children: N/A  . Years of education: N/A   Social History Main Topics  . Smoking status: Never Smoker  . Smokeless tobacco: Never Used  . Alcohol use 2.4 oz/week    4 Glasses of wine per week  . Drug use: No  . Sexual activity: Yes    Birth control/ protection: IUD     Comment: MIRENA (placed July 2012)   Other Topics Concern  . None   Social History Narrative  . None   Additional Social History:                         Sleep: Fair  Appetite:  Fair  Current Medications: Current Facility-Administered Medications  Medication Dose Route Frequency Provider Last Rate Last Dose  . acetaminophen (TYLENOL) tablet 650 mg  650 mg Oral Q6H PRN Kristjan Derner B Yeison Sippel, MD      . albuterol (PROVENTIL HFA;VENTOLIN HFA) 108 (90 Base) MCG/ACT inhaler 1-2 puff  1-2 puff Inhalation Q6H PRN Geovanni Rahming B Graceann Boileau, MD      . alum & mag hydroxide-simeth (MAALOX/MYLANTA) 200-200-20 MG/5ML suspension 30 mL  30 mL Oral Q4H PRN Devonna Oboyle B Calvin Jablonowski, MD      . busPIRone (BUSPAR) tablet 15 mg  15 mg Oral BID Clovis Fredrickson, MD   15 mg at 01/07/16 0853  . feeding supplement (ENSURE ENLIVE) (  ENSURE ENLIVE) liquid 237 mL  237 mL Oral BID BM Haywood Meinders B Brendin Situ, MD      . fluvoxaMINE (LUVOX) tablet 50 mg  50 mg Oral QHS Jaquelyn Sakamoto B Domingo Fuson, MD   50 mg at 01/06/16 2223  . hydrOXYzine (ATARAX/VISTARIL) tablet 25 mg  25 mg Oral TID PRN Abby Tucholski B Genelda Roark, MD      . magnesium hydroxide (MILK OF MAGNESIA) suspension 30 mL  30 mL Oral Daily PRN Nymir Ringler B Tracker Mance, MD      . traZODone (DESYREL) tablet 100 mg  100 mg Oral QHS Delno Blaisdell B Sherisse Fullilove, MD   100 mg at 01/06/16 2224  . ziprasidone (GEODON) capsule 40 mg  40 mg Oral BID WC Marijke Guadiana B Brindley Madarang, MD   40 mg at 01/07/16 0851    Lab Results: No results found for this or any previous visit (from the past 48 hour(s)).  Blood Alcohol level:  Lab Results  Component Value Date   ETH <5 01/04/2016   ETH <5  123456    Metabolic Disorder Labs: Lab Results  Component Value Date   HGBA1C 5.4 05/16/2015   MPG 108 05/16/2015   No results found for: PROLACTIN Lab Results  Component Value Date   CHOL 189 05/16/2015   TRIG 42 05/16/2015   HDL 61 05/16/2015   CHOLHDL 3.1 05/16/2015   VLDL 8 05/16/2015   LDLCALC 120 (H) 05/16/2015    Physical Findings: AIMS:  , ,  ,  ,    CIWA:    COWS:     Musculoskeletal: Strength & Muscle Tone: within normal limits Gait & Station: normal Patient leans: N/A  Psychiatric Specialty Exam: Physical Exam  Nursing note and vitals reviewed.   Review of Systems  Psychiatric/Behavioral: Positive for depression and suicidal ideas. The patient is nervous/anxious.   All other systems reviewed and are negative.   Blood pressure 95/73, pulse 95, temperature 98.6 F (37 C), temperature source Oral, resp. rate 18, height 5\' 6"  (1.676 m), weight 59 kg (130 lb), last menstrual period 01/03/2016, SpO2 100 %.Body mass index is 20.98 kg/m.  General Appearance: Casual  Eye Contact:  Good  Speech:  Clear and Coherent  Volume:  Decreased  Mood:  Depressed  Affect:  Blunt  Thought Process:  Goal Directed and Descriptions of Associations: Intact  Orientation:  Full (Time, Place, and Person)  Thought Content:  WDL  Suicidal Thoughts:  Yes.  with intent/plan  Homicidal Thoughts:  No  Memory:  Immediate;   Fair Recent;   Fair Remote;   Fair  Judgement:  Impaired  Insight:  Shallow  Psychomotor Activity:  Normal  Concentration:  Concentration: Fair and Attention Span: Fair  Recall:  [employer] of Knowledge:  Fair  Language:  Fair  Akathisia:  No  Handed:  Right  AIMS (if indicated):     Assets:  Communication Skills Desire for Improvement Financial Resources/Insurance Housing Intimacy Physical Health Resilience Social Support Transportation Vocational/Educational  ADL's:  Intact  Cognition:  WNL  Sleep:  Number of Hours: 6.5     Treatment  Plan Summary: Daily contact with patient to assess and evaluate symptoms and progress in treatment and Medication management   Ms. Mowry is a 35 year old female with history of bipolar depression admitted for worsening of depression and suicidal thinking with a plan to overdose on medications. The patient started hording Xanax for the past week in order to overdose.  1. Suicidal ideation. The patient is able to contract for safety  in the hospital.  2. Mood. She has been maintained lately on low dose lithium, Tegretol and high dose of BuSpar. She believes that the BuSpar has been helpful but wants to discontinue lithium and Tegretol. We will increase Geodon to 80 mg bid for mood stabilization.  3. Anxiety. At the patient reports panic attacks, OCD and PTSD type symptoms. We will increase Luvox to 100 mg night for OCD. We will not prescribe Xanax.  4. Asthma. Albuterol is available.  5. Insomnia. Trazodone is available.  6. Disposition. The patient will be discharged to home with family. She will follow up with the Wynema Birch at [place].  Orson Slick, MD 01/07/2016, 10:38 AM

## 2016-01-07 NOTE — Plan of Care (Signed)
Problem: [facility] Participation in Recreation Therapeutic Interventions Goal: STG-Patient will demonstrate improved self esteem by identif STG: Self-Esteem - Within 4 treatment sessions, patient will verbalize at least 5 positive affirmation statements in each of 2 treatment sessions to increase self-esteem post d/c.  Outcome: Progressing Treatment Session 1; Completed 1 out of 2: At approximately 3:20 pm, LRT met with patient in back consultation room. Patient verbalized 5 positive affirmation statements. Patient reported it felt "not as difficult as the first time I said them". LRT encouraged patient to continue saying positive affirmation statements.  Leonette Monarch, LRT/CTRS 10.20.17 3:27 pm Goal: STG-Other Recreation Therapy Goal (Specify) STG: Stress Management - Within 4 treatment sessions, patient will verbalize understanding of the stress management techniques in each of 2 treatment sessions to increase stress management skills post d/c.  Outcome: Progressing Treatment Session 1; Completed 1 out of 2: At approximately 3:20 pm, LRT met with patient in back consultation room. LRT educated and provided patient with handouts on stress management techniques. Patient verbalized understanding. LRT encouraged patient to read over and practice the stress management techniques.  Leonette Monarch, LRT/CTRS 10.20.17 3:29 pm

## 2016-01-07 NOTE — Progress Notes (Signed)
Patient stayed in the milieu after visiting with a family member. Attended group but was quiet. Received medications then went to bed. 0600: Patient slept well throughout the night. No major concern Therapeutic milieu promoted and safety maintained.

## 2016-01-08 LAB — TSH: TSH: 0.673 u[IU]/mL (ref 0.350–4.500)

## 2016-01-08 LAB — LIPID PANEL
Cholesterol: 192 mg/dL (ref 0–200)
HDL: 64 mg/dL (ref >40)
LDL CALC: 119 mg/dL — AB (ref 0–99)
TRIGLYCERIDES: 45 mg/dL (ref <150)
Total CHOL/HDL Ratio: 3 RATIO
VLDL: 9 mg/dL (ref 0–40)

## 2016-01-08 MED ORDER — BUSPIRONE HCL 15 MG PO TABS: 15.0000 mg | ORAL_TABLET | Freq: Two times a day (BID) | ORAL | 1 refills | Status: DC

## 2016-01-08 MED ORDER — ZIPRASIDONE HCL 80 MG PO CAPS: 80.0000 mg | ORAL_CAPSULE | Freq: Two times a day (BID) | ORAL | 1 refills | Status: DC

## 2016-01-08 MED ORDER — FLUVOXAMINE MALEATE 100 MG PO TABS: 100.0000 mg | ORAL_TABLET | Freq: Every day | ORAL | 1 refills | Status: DC

## 2016-01-08 MED ORDER — LOPERAMIDE HCL 2 MG PO CAPS: 2.0000 mg | ORAL_CAPSULE | Freq: Once | ORAL | Status: DC

## 2016-01-08 MED ORDER — TRAZODONE HCL 100 MG PO TABS: 100.0000 mg | ORAL_TABLET | Freq: Every day | ORAL | 1 refills | Status: DC

## 2016-01-08 NOTE — Progress Notes (Signed)
Denies SI/HI/AVH. Pt isolated to room majority of shift after visitation hrs ended and went to bed. Minimal interaction with staff and peers. Medication compliant. Denies pain and voices no additional concerns at this time. Will continue to monitor.

## 2016-01-08 NOTE — Discharge Summary (Signed)
Physician Discharge Summary Note  Patient:  Candace Browning is an 35 y.o., female MRN:  [id number] DOB:  09-Jul-1980 Patient phone:  [phone] (home)  Patient address:   [address] [place] [id number],  Total Time spent with patient: 30 minutes  Date of Admission:  01/05/2016 Date of Discharge: 01/08/2016  Reason for Admission:  Suicidal ideation.  Identifying data. Ms. Cardinas is a 35 year old female with history of bipolar depression.  Chief complaint. "I just want to die."  History of present illness. Information was obtained from the patient and the chart. The patient has a long history of bipolar depression diagnosed in 2006. She has been seeing her psychiatrist at [facility] regularly. 6 weeks ago she was started on a combination of low-dose lithium and Tegretol and high-dose BuSpar. Reportedly the patient was unable to tolerate higher dose of lithium. Initially she felt great in the past weeks however she became increasingly depressed with poor sleep, decreased appetite, anhedonia, feeling of guilt and hopelessness worthlessness, poor energy and concentration, social isolation, crying spells, and heightened anxiety. She started collecting her Xanax in order to overdose. She reports that she has been taking other medications as prescribed. She told her husband about her plan and they threw away collected pills. She came to the hospital for help. The patient denies frank psychotic symptoms but oftentimes feels paranoid. She has infrequent panic attacks. She reports symptoms suggestive of PTSD with hypervigilance and nightmares. She also has OCD with excessive worries, ruminations including suicidal thoughts, and excessive cleaning and organizing. She denies alcohol or substance use.  Past psychiatric history. She was diagnosed in 2006. She has been hospitalized 4 times for depression and suicide since. She attempted suicide by cutting and 2006 and by overdose in February 2017.  She has been tried on numerous medications including Depakote, Risperdal, Seroquel, Lamictal, Abilify, Latuda, Tegretol, lithium, BuSpar, and Xanax. She experienced unacceptable side effects from all of them, mostly somnolence. She did well on Risperdal but developed galactorrhea. She is in weekly psychotherapy but no longer is able to afford it as frequently and sees her therapist every 3-4 weeks.  Family psychiatric history. Nonreported.  Social history. She lives with her husband and her 2 year old son. 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 She had to stop due to very long working hours. She now works at [employer] not very happy about her job.  Principal Problem: Bipolar I disorder, most recent episode depressed [facility]) Discharge Diagnoses: Patient Active Problem List   Diagnosis Date Noted  . OCD (obsessive compulsive disorder) [F42.9] 01/06/2016  . Suicidal ideation [R45.851] 01/05/2016  . Acute sinusitis [J01.90] 12/21/2015  . GERD (gastroesophageal reflux disease) [K21.9] 07/16/2015  . Seasonal allergic conjunctivitis [H10.45] 06/23/2015  . Bipolar I disorder, most recent episode depressed ([place]) [F31.30]   . Mild persistent asthma [J45.30] 01/11/2015  . Allergic rhinitis [J30.89] 01/11/2015  . Food allergy [T78.00XA] 01/11/2015  . Atopic dermatitis [L20.9] 01/11/2015    Past Medical History:  Past Medical History:  Diagnosis Date  . Allergic rhinitis   . Anemia   . Anxiety   . Asthma   . Atopic dermatitis   . Bipolar 1 disorder ([place])   . Eczema   . Food allergy   . Herpes simplex without mention of complication    HSV2  . Renal disorder     Past Surgical History:  Procedure Laterality Date  . WISDOM TOOTH EXTRACTION     Family  History:  Family History  Problem Relation Age of Onset  . Asthma Mother   . Anemia Mother   . Arthritis Mother     HAD KNEE REPLACEMENT  . Diabetes Maternal Grandmother   . Arthritis Maternal  Grandmother     KNEE REPLACEMENT   Social History:  History  Alcohol Use  . 2.4 oz/week  . 4 Glasses of wine per week     History  Drug Use No    Social History   Social History  . Marital status: Single    Spouse name: N/A  . Number of children: N/A  . Years of education: N/A   Social History Main Topics  . Smoking status: Never Smoker  . Smokeless tobacco: Never Used  . Alcohol use 2.4 oz/week    4 Glasses of wine per week  . Drug use: No  . Sexual activity: Yes    Birth control/ protection: IUD     Comment: MIRENA (placed July 2012)   Other Topics Concern  . None   Social History Narrative  . None    Hospital Course:    Ms. Hillier is a 35 year old female with history of bipolar depression admitted for worsening of depression and suicidal thinking with a plan to overdose on medications. The patient started hording Xanax for the past week in order to overdose.  1. Suicidal ideation. This has resolved. The patient is able to contract for safety. She is forward thinking and optimistic about the future. She is a loving mother and wife.   2. Mood. We continued Tegretol but the patient felt that lithium and Tegretol were not helpful. We started Geodon for mood stabilization.  3. Anxiety. At the patient reports panic attacks, OCD and PTSD type symptoms. We started Luvox. We discontinued Xanax.  4. Asthma. Albuterol is available.  5. Insomnia. Trazodone is available.  6.Metabolic syndrome monitoring. Labs are pending.  7. EKG. Pending.  8.  Disposition. The patient will be discharged to home with family. She will follow up with the Wynema Birch at [place].  Physical Findings: AIMS:  , ,  ,  ,    CIWA:    COWS:     Musculoskeletal: Strength & Muscle Tone: within normal limits Gait & Station: normal Patient leans: N/A  Psychiatric Specialty Exam: Physical Exam  Nursing note and vitals reviewed.   Review of Systems  HENT: Positive for  congestion.   Gastrointestinal: Positive for diarrhea.  Psychiatric/Behavioral: Positive for depression. The patient is nervous/anxious.     Blood pressure 100/76, pulse 80, temperature 98.3 F (36.8 C), temperature source Oral, resp. rate 20, height 5\' 6"  (1.676 m), weight 59 kg (130 lb), last menstrual period 01/03/2016, SpO2 100 %.Body mass index is 20.98 kg/m.  See SRA.                                                  Sleep:  Number of Hours: 8     Have you used any form of tobacco in the last 30 days? (Cigarettes, Smokeless Tobacco, Cigars, and/or Pipes): No  Has this patient used any form of tobacco in the last 30 days? (Cigarettes, Smokeless Tobacco, Cigars, and/or Pipes) Yes, No  Blood Alcohol level:  Lab Results  Component Value Date   ETH <5 01/04/2016   ETH <5 [id number]    Metabolic  Disorder Labs:  Lab Results  Component Value Date   HGBA1C 5.4 05/16/2015   MPG 108 05/16/2015   No results found for: PROLACTIN Lab Results  Component Value Date   CHOL 189 05/16/2015   TRIG 42 05/16/2015   HDL 61 05/16/2015   CHOLHDL 3.1 05/16/2015   VLDL 8 05/16/2015   LDLCALC 120 (H) 05/16/2015    See Psychiatric Specialty Exam and Suicide Risk Assessment completed by Attending Physician prior to discharge.  Discharge destination:  Home  Is patient on multiple antipsychotic therapies at discharge:  No   Has Patient had three or more failed trials of antipsychotic monotherapy by history:  No  Recommended Plan for Multiple Antipsychotic Therapies: NA  Discharge Instructions    Diet - low sodium heart healthy    Complete by:  As directed    Increase activity slowly    Complete by:  As directed        Medication List    STOP taking these medications   ALPRAZolam 0.25 MG tablet Commonly known as:  XANAX   azelastine 0.05 % ophthalmic solution Commonly known as:  OPTIVAR   Azelastine HCl 0.15 % Soln   Carbamazepine 100 MG Cp12 12 hr  capsule Commonly known as:  EQUETRO   carbamazepine 200 MG tablet Commonly known as:  TEGRETOL   Epinastine HCl 0.05 % ophthalmic solution Commonly known as:  ELESTAT   fluticasone 50 MCG/ACT nasal spray Commonly known as:  FLONASE   lithium carbonate 300 MG capsule   lurasidone 40 MG Tabs tablet Commonly known as:  LATUDA   montelukast 10 MG tablet Commonly known as:  SINGULAIR   Olopatadine HCl 0.2 % Soln Commonly known as:  PATADAY   omeprazole 20 MG tablet Commonly known as:  PRILOSEC OTC     TAKE these medications     Indication  albuterol 108 (90 Base) MCG/ACT inhaler Commonly known as:  PROVENTIL HFA;VENTOLIN HFA Inhale 2 puffs into the lungs every 6 (six) hours as needed.  Indication:  Asthma   busPIRone 15 MG tablet Commonly known as:  BUSPAR Take 1 tablet (15 mg total) by mouth 2 (two) times daily.  Indication:  Anxiety Disorder   EPINEPHrine 0.3 mg/0.3 mL Soaj injection Commonly known as:  EPI-PEN Inject 0.3 mg into the muscle as needed.  Indication:  Life-Threatening Allergic Reaction   fluvoxaMINE 100 MG tablet Commonly known as:  LUVOX Take 1 tablet (100 mg total) by mouth at bedtime.  Indication:  Depression, Obsessive Compulsive Disorder   levocetirizine 5 MG tablet Commonly known as:  XYZAL TAKE ONE TABLET BY MOUTH EVERY EVENING  Indication:  Perennial Allergic Rhinitis   traZODone 100 MG tablet Commonly known as:  DESYREL Take 1 tablet (100 mg total) by mouth at bedtime.  Indication:  Trouble Sleeping   valACYclovir 500 MG tablet Commonly known as:  VALTREX Take 1 tablet (500 mg total) by mouth 2 (two) times daily as needed (outbreaks).  Indication:  Genital Herpes, Herpes Simplex Infection   ziprasidone 80 MG capsule Commonly known as:  GEODON Take 1 capsule (80 mg total) by mouth 2 (two) times daily with a meal.  Indication:  Manic-Depression      Follow-up Information    [facility] .   Why:  Please arrive on  Tuesday October 24th at 4pm with Dr. Donnal Moat and on Monday the 23 at 10:15am for therapy with Rosary Lively  Contact information: [place] [address], [place],  [place]  [address]  Phone [phone] Fax [phone]            Follow-up recommendations:  Activity:  as tolerated. Diet:  regular. Other:  keep follow up appointments.  Comments:     Signed: Orson Slick, MD 01/08/2016, 10:18 AM

## 2016-01-08 NOTE — Tx Team (Signed)
Interdisciplinary Treatment and Diagnostic Plan Update  01/08/2016 Time of Session: 11:24am Candace Browning MRN: [id number]  Principal Diagnosis: Bipolar I disorder, most recent episode depressed ([place])  Secondary Diagnoses: Principal Problem:   Bipolar I disorder, most recent episode depressed ([place]) Active Problems:   Mild persistent asthma   Suicidal ideation   OCD (obsessive compulsive disorder)   Current Medications:  Current Facility-Administered Medications  Medication Dose Route Frequency Provider Last Rate Last Dose  . acetaminophen (TYLENOL) tablet 650 mg  650 mg Oral Q6H PRN Jolanta B Pucilowska, MD      . albuterol (PROVENTIL HFA;VENTOLIN HFA) 108 (90 Base) MCG/ACT inhaler 1-2 puff  1-2 puff Inhalation Q6H PRN Jolanta B Pucilowska, MD      . alum & mag hydroxide-simeth (MAALOX/MYLANTA) 200-200-20 MG/5ML suspension 30 mL  30 mL Oral Q4H PRN Jolanta B Pucilowska, MD      . busPIRone (BUSPAR) tablet 15 mg  15 mg Oral BID Clovis Fredrickson, MD   15 mg at 01/08/16 0817  . feeding supplement (ENSURE ENLIVE) (ENSURE ENLIVE) liquid 237 mL  237 mL Oral BID BM Jolanta B Pucilowska, MD   237 mL at 01/08/16 1000  . fluvoxaMINE (LUVOX) tablet 100 mg  100 mg Oral QHS Jolanta B Pucilowska, MD   100 mg at 01/07/16 2225  . loperamide (IMODIUM) capsule 2 mg  2 mg Oral Once Jolanta B Pucilowska, MD      . magnesium hydroxide (MILK OF MAGNESIA) suspension 30 mL  30 mL Oral Daily PRN Jolanta B Pucilowska, MD      . traZODone (DESYREL) tablet 100 mg  100 mg Oral QHS Jolanta B Pucilowska, MD   100 mg at 01/06/16 2224  . ziprasidone (GEODON) capsule 80 mg  80 mg Oral BID WC Jolanta B Pucilowska, MD   80 mg at 01/08/16 0816   PTA Medications: Facility-Administered Medications Prior to Admission  Medication Dose Route Frequency Provider Last Rate Last Dose  . predniSONE (DELTASONE) tablet 10 mg  10 mg Oral Q breakfast Adelina Mings, MD       Prescriptions Prior to Admission  Medication  Sig Dispense Refill Last Dose  . albuterol (PROVENTIL HFA;VENTOLIN HFA) 108 (90 Base) MCG/ACT inhaler Inhale 2 puffs into the lungs every 6 (six) hours as needed.   unknown at unknown  . ALPRAZolam (XANAX) 0.25 MG tablet Take 0.25 mg by mouth as needed.   01/03/2016 at Unknown time  . azelastine (OPTIVAR) 0.05 % ophthalmic solution Place 1 drop into both eyes 2 (two) times daily. (Patient not taking: Reported on 01/04/2016) 6 mL 5 Not Taking at Unknown time  . Azelastine HCl 0.15 % SOLN Place 2 sprays into both nostrils 2 (two) times daily. (Patient not taking: Reported on 01/04/2016) 30 mL 5 Not Taking at Unknown time  . Carbamazepine (EQUETRO) 100 MG CP12 12 hr capsule Take 1 capsule (100 mg total) by mouth 2 (two) times daily. (Patient not taking: Reported on 01/04/2016) 60 each 0 Not Taking at Unknown time  . carbamazepine (TEGRETOL) 200 MG tablet Take 200 mg by mouth 2 (two) times daily.   01/02/2016 at unknown time  . Epinastine HCl (ELESTAT) 0.05 % ophthalmic solution Place 1 drop into both eyes 2 (two) times daily. (Patient not taking: Reported on 01/04/2016) 10 mL 12 Not Taking at Unknown time  . EPINEPHrine 0.3 mg/0.3 mL IJ SOAJ injection Inject 0.3 mg into the muscle as needed.   not used at unknown  . fluticasone (FLONASE)  50 MCG/ACT nasal spray Place 2 sprays into both nostrils daily. 16 g 3 unknown at unknown  . levocetirizine (XYZAL) 5 MG tablet TAKE ONE TABLET BY MOUTH EVERY EVENING 30 tablet 0   . lithium carbonate 300 MG capsule Take 300 mg by mouth at bedtime.    01/03/2016 at Unknown time  . lurasidone (LATUDA) 40 MG TABS tablet Take 1 tablet (40 mg total) by mouth daily. (Patient not taking: Reported on 12/21/2015) 30 tablet 0 Not Taking  . montelukast (SINGULAIR) 10 MG tablet Take one tablet once daily in the evening to prevent cough or wheeze. (Patient not taking: Reported on 12/21/2015) 30 tablet 5 Not Taking  . Olopatadine HCl (PATADAY) 0.2 % SOLN Apply 1 drop to eye daily.  (Patient not taking: Reported on 12/21/2015) 1 Bottle 3 Not Taking  . omeprazole (PRILOSEC OTC) 20 MG tablet Take one daily as directed. (Patient not taking: Reported on 12/21/2015) 30 tablet 5 Not Taking  . valACYclovir (VALTREX) 500 MG tablet Take 1 tablet (500 mg total) by mouth 2 (two) times daily as needed (outbreaks). 30 tablet 0 unknown at unknown  . [DISCONTINUED] busPIRone (BUSPAR) 15 MG tablet Take 15 mg by mouth 2 (two) times daily.    01/04/2016 at Unknown time    Patient Stressors: Medication change or noncompliance Traumatic event  Patient Strengths: Ability for insight Capable of independent living Communication skills Supportive family/friends  Treatment Modalities: Medication Management, Group therapy, Case management,  1 to 1 session with clinician, Psychoeducation, Recreational therapy.   Physician Treatment Plan for Primary Diagnosis: Bipolar I disorder, most recent episode depressed ([place]) Long Term Goal(s): Improvement in symptoms so as ready for discharge NA   Short Term Goals: Ability to identify changes in lifestyle to reduce recurrence of condition will improve Ability to verbalize feelings will improve Ability to disclose and discuss suicidal ideas Ability to demonstrate self-control will improve Ability to identify and develop effective coping behaviors will improve Ability to maintain clinical measurements within normal limits will improve NA  Medication Management: Evaluate patient's response, side effects, and tolerance of medication regimen.  Therapeutic Interventions: 1 to 1 sessions, Unit Group sessions and Medication administration.  Evaluation of Outcomes: Adequate for Discharge  Physician Treatment Plan for Secondary Diagnosis: Principal Problem:   Bipolar I disorder, most recent episode depressed ([place]) Active Problems:   Mild persistent asthma   Suicidal ideation   OCD (obsessive compulsive disorder)  Long Term Goal(s): Improvement in  symptoms so as ready for discharge NA   Short Term Goals: Ability to identify changes in lifestyle to reduce recurrence of condition will improve Ability to verbalize feelings will improve Ability to disclose and discuss suicidal ideas Ability to demonstrate self-control will improve Ability to identify and develop effective coping behaviors will improve Ability to maintain clinical measurements within normal limits will improve NA     Medication Management: Evaluate patient's response, side effects, and tolerance of medication regimen.  Therapeutic Interventions: 1 to 1 sessions, Unit Group sessions and Medication administration.  Evaluation of Outcomes: Adequate for Discharge   RN Treatment Plan for Primary Diagnosis: Bipolar I disorder, most recent episode depressed ([place]) Long Term Goal(s): Knowledge of disease and therapeutic regimen to maintain health will improve  Short Term Goals: Ability to remain free from injury will improve, Ability to verbalize feelings will improve, Ability to disclose and discuss suicidal ideas, Ability to identify and develop effective coping behaviors will improve and Compliance with prescribed medications will improve  Medication Management: RN  will administer medications as ordered by provider, will assess and evaluate patient's response and provide education to patient for prescribed medication. RN will report any adverse and/or side effects to prescribing provider.  Therapeutic Interventions: 1 on 1 counseling sessions, Psychoeducation, Medication administration, Evaluate responses to treatment, Monitor vital signs and CBGs as ordered, Perform/monitor CIWA, COWS, AIMS and Fall Risk screenings as ordered, Perform wound care treatments as ordered.  Evaluation of Outcomes: Adequate for Discharge   LCSW Treatment Plan for Primary Diagnosis: Bipolar I disorder, most recent episode depressed ([place]) Long Term Goal(s): Safe transition to appropriate next  level of care at discharge, Engage patient in therapeutic group addressing interpersonal concerns.  Short Term Goals: Engage patient in aftercare planning with referrals and resources, Increase social support, Increase ability to appropriately verbalize feelings, Increase emotional regulation, Facilitate acceptance of mental health diagnosis and concerns and Facilitate patient progression through stages of change regarding substance use diagnoses and concerns  Therapeutic Interventions: Assess for all discharge needs, 1 to 1 time with Social worker, Explore available resources and support systems, Assess for adequacy in community support network, Educate family and significant other(s) on suicide prevention, Complete Psychosocial Assessment, Interpersonal group therapy.  Evaluation of Outcomes: Adequate for Discharge   Progress in Treatment: Attending groups: Yes. Participating in groups: Yes. Taking medication as prescribed: Yes. Toleration medication: Yes. Family/Significant other contact made: Yes, individual(s) contacted:  husband Patient understands diagnosis: Yes. Discussing patient identified problems/goals with staff: Yes. Medical problems stabilized or resolved: Yes. Denies suicidal/homicidal ideation: Yes. Issues/concerns per patient self-inventory: No. Other: n/a  New problem(s) identified: None identified as this time.  New Short Term/Long Term Goal(s): None identified at this time.   Discharge Plan or Barriers: Patient will discharge back home to live with her husband and has scheduled psychiatric follow-up with [facility]  Reason for Continuation of Hospitalization: Anticipated discharge 01/08/2016  Estimated Length of Stay: Anticipated discharge 01/08/2016  Attendees: Patient:Candace Browning 01/08/2016 11:24 AM  Physician: Dr. Bary Leriche, MD 01/08/2016 11:24 AM  Nursing: Elige Radon, RN 01/08/2016 11:24 AM  RN Care Manager: 01/08/2016 11:24 AM  Social  Worker: Lear Ng. Claybon Jabs MSW, [place] 01/08/2016 11:24 AM  Recreational Therapist:  01/08/2016 11:24 AM  Other:  01/08/2016 11:24 AM  Other:  01/08/2016 11:24 AM  Other: 01/08/2016 11:24 AM    Scribe for Treatment Team: Jolaine Click, LCSWA 01/08/2016 11:28 AM

## 2016-01-08 NOTE — Progress Notes (Signed)
  [facility] Adult Case Management Discharge Plan :  Will you be returning to the same living situation after discharge:  Yes,  home with husband At discharge, do you have transportation home?: Yes,  husband Do you have the ability to pay for your medications: Yes,  patient has insurance  Release of information consent forms completed and in the chart;  Patient's signature needed at discharge.  Patient to Follow up at: Follow-up Information    [facility] .   Why:  Please arrive on Tuesday October 24th at 4pm with Dr. Donnal Moat and on Monday the 23 at 10:15am for therapy with Rosary Lively  Contact information: [facility] [address] [place], [place]  [address]  Phone [phone] Fax [phone]            Next level of care provider has access to [place] and Suicide Prevention discussed: Yes,  with husband  Have you used any form of tobacco in the last 30 days? (Cigarettes, Smokeless Tobacco, Cigars, and/or Pipes): No  Has patient been referred to the Quitline?: N/A patient is not a smoker  Patient has been referred for addiction treatment: Yes  Candace Browning G. [place], [place] 01/08/2016, 11:23 AM

## 2016-01-08 NOTE — [facility] Suicide Risk Assessment (Signed)
[place] INPATIENT:  Family/Significant Other Suicide Prevention Education  Suicide Prevention Education:  Education Completed;Joen Laura (husband [phone]), has been identified by the patient as the family member/significant other with whom the patient will be residing, and identified as the person(s) who will aid the patient in the event of a mental health crisis (suicidal ideations/suicide attempt).  With written consent from the patient, the family member/significant other has been provided the following suicide prevention education, prior to the and/or following the discharge of the patient.  The suicide prevention education provided includes the following:  Suicide risk factors  Suicide prevention and interventions  National Suicide Hotline telephone number  [facility] assessment telephone number  [facility] Emergency Assistance [place] and/or Residential Mobile Crisis Unit telephone number  Request made of family/significant other to:  Remove weapons (e.g., guns, rifles, knives), all items previously/currently identified as safety concern.    Remove drugs/medications (over-the-counter, prescriptions, illicit drugs), all items previously/currently identified as a safety concern.  The family member/significant other verbalizes understanding of the suicide prevention education information provided.  The family member/significant other agrees to remove the items of safety concern listed above.  Maryetta Shafer G. [place], [place] 01/08/2016, 11:21 AM

## 2016-01-08 NOTE — Progress Notes (Signed)
Provided and reviewed discharge paperwork and prescriptions. Verified understanding by use of teach back method. Verbalized understanding. Denies SI/HI/AVH. Pt belongings returned to include items from pt specific locker: 1 pair of sweatpants with strings, 1 long sleep shirt, 1 yellow glasses case, 1 clear soap box, 1 red bandana, 1 winter hat, 1 black/white duffle bag. Pt to also take her personal belongings kept in her room. Husband to transport pt home at 1300 once discharged. Safety maintained. Will continue to monitor.

## 2016-01-08 NOTE — Progress Notes (Signed)
Awake and alert on unit this morning. Appropriately interacts with staff/peers. Complains of diarrhea during breakfast, and reports decreased appetite because of diarrhea. MD informed. Reports fair sleep "because I went to bed too early." Reports she went to bed at 2100 and woke at 0200 unable to sleep. Reports normal energy level and good concentration. Rates depression 4/10, hopelessness 2/10, and anxiety 5/10 (low0-10high). Denies SI/HI/AVH. Pleasant, cooperative, and anxious at times. Attends groups. Medication complaint.   Safety maintained with every 15 minute checks. Support and encouragement provided with use of therapeutic communication. Medications administered as ordered with education. Will continue to monitor.

## 2016-01-08 NOTE — BHH Suicide Risk Assessment (Signed)
[facility] Discharge Suicide Risk Assessment   Principal Problem: Bipolar I disorder, most recent episode depressed [facility])  Discharge Diagnoses:  Patient Active Problem List   Diagnosis Date Noted  . OCD (obsessive compulsive disorder) [F42.9] 01/06/2016  . Suicidal ideation [R45.851] 01/05/2016  . Acute sinusitis [J01.90] 12/21/2015  . GERD (gastroesophageal reflux disease) [K21.9] 07/16/2015  . Seasonal allergic conjunctivitis [H10.45] 06/23/2015  . Bipolar I disorder, most recent episode depressed ([place]) [F31.30]   . Mild persistent asthma [J45.30] 01/11/2015  . Allergic rhinitis [J30.89] 01/11/2015  . Food allergy [T78.00XA] 01/11/2015  . Atopic dermatitis [L20.9] 01/11/2015    Total Time spent with patient: 30 minutes  Musculoskeletal: Strength & Muscle Tone: within normal limits Gait & Station: normal Patient leans: N/A  Psychiatric Specialty Exam: Review of Systems  HENT: Positive for congestion.   Psychiatric/Behavioral: Positive for depression. The patient is nervous/anxious.   All other systems reviewed and are negative.   Blood pressure 100/76, pulse 80, temperature 98.3 F (36.8 C), temperature source Oral, resp. rate 20, height 5\' 6"  (1.676 m), weight 59 kg (130 lb), last menstrual period 01/03/2016, SpO2 100 %.Body mass index is 20.98 kg/m.  General Appearance: Casual  Eye Contact::  Good  Speech:  Clear and Coherent409  Volume:  Normal  Mood:  Euthymic  Affect:  Appropriate  Thought Process:  Goal Directed and Descriptions of Associations: Intact  Orientation:  Full (Time, Place, and Person)  Thought Content:  WDL  Suicidal Thoughts:  No  Homicidal Thoughts:  No  Memory:  Immediate;   Fair Recent;   Fair Remote;   Fair  Judgement:  Fair  Insight:  Fair  Psychomotor Activity:  Normal  Concentration:  Fair  Recall:  [employer] [place]  Language: Fair  Akathisia:  No  Handed:  Right  AIMS (if indicated):     Assets:  Communication Skills Desire  for Improvement Financial Resources/Insurance Housing Intimacy Physical Health Resilience Social Support Transportation Vocational/Educational  Sleep:  Number of Hours: 8  Cognition: WNL  ADL's:  Intact   Mental Status Per Nursing Assessment::   On Admission:     Demographic Factors:  NA  Loss Factors: Decrease in vocational status and Financial problems/change in socioeconomic status  Historical Factors: Prior suicide attempts, Family history of mental illness or substance abuse and Impulsivity  Risk Reduction Factors:   Responsible for children under 42 years of age, Sense of responsibility to family, Employed, Living with another person, especially a relative, Positive social support and Positive therapeutic relationship  Continued Clinical Symptoms:  Bipolar Disorder:   Depressive phase Depression:   Impulsivity Obsessive-Compulsive Disorder  Cognitive Features That Contribute To Risk:  None    Suicide Risk:  Minimal: No identifiable suicidal ideation.  Patients presenting with no risk factors but with morbid ruminations; may be classified as minimal risk based on the severity of the depressive symptoms  Follow-up Information    [facility] .   Why:  Please arrive on Tuesday October 24th at 4pm with Dr. Donnal Moat and on Monday the 23 at 10:15am for therapy with Rosary Lively  Contact information: [facility] [address], [place], [place]  [address]  Phone [phone] Fax [phone]            Plan Of Care/Follow-up recommendations:  Activity:  as tolerated. Diet:  regular. Other:  keep follow up appointments.  Orson Slick, MD 01/08/2016, 10:12 AM

## 2016-01-08 NOTE — BHH Group Notes (Addendum)
[facility] LCSW Group Therapy  01/08/2016 9:26 AM (Late Entry from 01/07/2016)  Type of Therapy:  Group Therapy  Participation Level:  Active  Participation Quality:  Appropriate  Affect:  Appropriate  Cognitive:  Appropriate  Insight:  Engaged  Engagement in Therapy:  Engaged  Modes of Intervention:  Activity, Discussion, Education and Support  Summary of Progress/Problems: Feelings around Relapse. Group members discussed the meaning of relapse and shared personal stories of relapse, how it affected them and others, and how they perceived themselves during this time. Group members were encouraged to identify triggers, warning signs and coping skills used when facing the possibility of relapse. Social supports were discussed and explored in detail. Patients also discussed facing disappointment and how that can trigger someone to relapse.   Jaquan Sadowsky G. Maben, [place] 01/08/2016, 9:27 AM

## 2016-01-08 NOTE — Plan of Care (Signed)
Problem: Activity: Goal: Imbalance in normal sleep/wake cycle will improve Outcome: Progressing Reports poor sleep last night "because I went to bed too early." Slept from 2100-0200. Woke unable to go back to sleep.

## 2016-01-09 LAB — HEMOGLOBIN A1C
Hgb A1c MFr Bld: 5.1 % (ref 4.8–5.6)
MEAN PLASMA GLUCOSE: 100 mg/dL

## 2016-01-10 NOTE — Progress Notes (Signed)
Recreation Therapy Notes  INPATIENT RECREATION TR PLAN  Patient Details Name: Candace Browning MRN: [id number] DOB: [birth year] Today's Date: 01/10/2016  Rec Therapy Plan Is patient appropriate for Therapeutic Recreation?: Yes Treatment times per week: At least once a week TR Treatment/Interventions: 1:1 session, Group participation (Comment) (Appropriate particiaption in daily recreational therapy tx)  Discharge Criteria Pt will be discharged from therapy if:: Treatment goals are met, Discharged Treatment plan/goals/alternatives discussed and agreed upon by:: Patient/family  Discharge Summary Short term goals set: See Care Plan Short term goals met: Adequate for discharge Progress toward goals comments: One-to-one attended One-to-one attended: Self-esteem, stress management Reason goals not met: Patient d/c before goals could be met Therapeutic equipment acquired: None Reason patient discharged from therapy: Discharge from hospital Pt/family agrees with progress & goals achieved: Yes Date patient discharged from therapy: 01/08/16   Leonette Monarch, LRT/CTRS 01/10/2016, 2:05 PM

## 2016-01-17 ENCOUNTER — Ambulatory Visit (INDEPENDENT_AMBULATORY_CARE_PROVIDER_SITE_OTHER)

## 2016-01-17 DIAGNOSIS — J309 Allergic rhinitis, unspecified: Secondary | ICD-10-CM | POA: Diagnosis not present

## 2016-01-21 ENCOUNTER — Ambulatory Visit (INDEPENDENT_AMBULATORY_CARE_PROVIDER_SITE_OTHER)

## 2016-01-21 DIAGNOSIS — J309 Allergic rhinitis, unspecified: Secondary | ICD-10-CM

## 2016-01-25 ENCOUNTER — Ambulatory Visit (INDEPENDENT_AMBULATORY_CARE_PROVIDER_SITE_OTHER)

## 2016-01-25 DIAGNOSIS — J309 Allergic rhinitis, unspecified: Secondary | ICD-10-CM | POA: Diagnosis not present

## 2016-01-31 ENCOUNTER — Ambulatory Visit (INDEPENDENT_AMBULATORY_CARE_PROVIDER_SITE_OTHER): Admitting: *Deleted

## 2016-01-31 DIAGNOSIS — J309 Allergic rhinitis, unspecified: Secondary | ICD-10-CM

## 2016-02-18 ENCOUNTER — Ambulatory Visit (INDEPENDENT_AMBULATORY_CARE_PROVIDER_SITE_OTHER)

## 2016-02-18 DIAGNOSIS — J309 Allergic rhinitis, unspecified: Secondary | ICD-10-CM | POA: Diagnosis not present

## 2016-02-25 ENCOUNTER — Ambulatory Visit (INDEPENDENT_AMBULATORY_CARE_PROVIDER_SITE_OTHER)

## 2016-02-25 DIAGNOSIS — J309 Allergic rhinitis, unspecified: Secondary | ICD-10-CM | POA: Diagnosis not present

## 2016-02-29 ENCOUNTER — Ambulatory Visit (INDEPENDENT_AMBULATORY_CARE_PROVIDER_SITE_OTHER): Admitting: *Deleted

## 2016-02-29 ENCOUNTER — Encounter: Payer: Self-pay | Admitting: *Deleted

## 2016-02-29 DIAGNOSIS — J309 Allergic rhinitis, unspecified: Secondary | ICD-10-CM | POA: Diagnosis not present

## 2016-03-03 ENCOUNTER — Ambulatory Visit (INDEPENDENT_AMBULATORY_CARE_PROVIDER_SITE_OTHER): Admitting: *Deleted

## 2016-03-03 DIAGNOSIS — J309 Allergic rhinitis, unspecified: Secondary | ICD-10-CM

## 2016-03-04 ENCOUNTER — Other Ambulatory Visit: Payer: Self-pay | Admitting: Allergy and Immunology

## 2016-03-04 ENCOUNTER — Other Ambulatory Visit: Payer: Self-pay | Admitting: Psychiatry

## 2016-03-07 ENCOUNTER — Ambulatory Visit (INDEPENDENT_AMBULATORY_CARE_PROVIDER_SITE_OTHER)

## 2016-03-07 DIAGNOSIS — J309 Allergic rhinitis, unspecified: Secondary | ICD-10-CM

## 2016-03-10 ENCOUNTER — Ambulatory Visit: Payer: Self-pay

## 2016-03-10 DIAGNOSIS — J309 Allergic rhinitis, unspecified: Secondary | ICD-10-CM

## 2016-03-10 NOTE — Progress Notes (Signed)
Pt came in for allergy injections.   No injections given today due vials needing to be mixed down. Pt advsd she could not wait and would come back next week.

## 2016-03-15 ENCOUNTER — Ambulatory Visit (INDEPENDENT_AMBULATORY_CARE_PROVIDER_SITE_OTHER): Admitting: *Deleted

## 2016-03-15 DIAGNOSIS — J309 Allergic rhinitis, unspecified: Secondary | ICD-10-CM

## 2016-03-17 ENCOUNTER — Ambulatory Visit (INDEPENDENT_AMBULATORY_CARE_PROVIDER_SITE_OTHER)

## 2016-03-17 DIAGNOSIS — J309 Allergic rhinitis, unspecified: Secondary | ICD-10-CM | POA: Diagnosis not present

## 2016-03-27 ENCOUNTER — Ambulatory Visit (INDEPENDENT_AMBULATORY_CARE_PROVIDER_SITE_OTHER): Admitting: *Deleted

## 2016-03-27 DIAGNOSIS — J309 Allergic rhinitis, unspecified: Secondary | ICD-10-CM | POA: Diagnosis not present

## 2016-03-30 ENCOUNTER — Ambulatory Visit (INDEPENDENT_AMBULATORY_CARE_PROVIDER_SITE_OTHER)

## 2016-03-30 DIAGNOSIS — J309 Allergic rhinitis, unspecified: Secondary | ICD-10-CM | POA: Diagnosis not present

## 2016-04-07 ENCOUNTER — Ambulatory Visit (INDEPENDENT_AMBULATORY_CARE_PROVIDER_SITE_OTHER): Admitting: *Deleted

## 2016-04-07 DIAGNOSIS — J309 Allergic rhinitis, unspecified: Secondary | ICD-10-CM | POA: Diagnosis not present

## 2016-04-13 ENCOUNTER — Ambulatory Visit (INDEPENDENT_AMBULATORY_CARE_PROVIDER_SITE_OTHER)

## 2016-04-13 DIAGNOSIS — J309 Allergic rhinitis, unspecified: Secondary | ICD-10-CM | POA: Diagnosis not present

## 2016-04-14 NOTE — Addendum Note (Signed)
Addended by: Felipa Emory on: 04/14/2016 09:04 AM   Modules accepted: Orders

## 2016-04-24 ENCOUNTER — Ambulatory Visit (INDEPENDENT_AMBULATORY_CARE_PROVIDER_SITE_OTHER)

## 2016-04-24 DIAGNOSIS — J309 Allergic rhinitis, unspecified: Secondary | ICD-10-CM | POA: Diagnosis not present

## 2016-05-09 ENCOUNTER — Ambulatory Visit (INDEPENDENT_AMBULATORY_CARE_PROVIDER_SITE_OTHER): Admitting: *Deleted

## 2016-05-09 DIAGNOSIS — J309 Allergic rhinitis, unspecified: Secondary | ICD-10-CM

## 2016-05-24 ENCOUNTER — Ambulatory Visit (INDEPENDENT_AMBULATORY_CARE_PROVIDER_SITE_OTHER): Admitting: *Deleted

## 2016-05-24 DIAGNOSIS — J309 Allergic rhinitis, unspecified: Secondary | ICD-10-CM

## 2016-06-14 ENCOUNTER — Ambulatory Visit (INDEPENDENT_AMBULATORY_CARE_PROVIDER_SITE_OTHER)

## 2016-06-14 DIAGNOSIS — J309 Allergic rhinitis, unspecified: Secondary | ICD-10-CM | POA: Diagnosis not present

## 2016-06-21 ENCOUNTER — Ambulatory Visit (INDEPENDENT_AMBULATORY_CARE_PROVIDER_SITE_OTHER): Admitting: *Deleted

## 2016-06-21 DIAGNOSIS — J309 Allergic rhinitis, unspecified: Secondary | ICD-10-CM | POA: Diagnosis not present

## 2016-06-28 ENCOUNTER — Ambulatory Visit (INDEPENDENT_AMBULATORY_CARE_PROVIDER_SITE_OTHER): Admitting: *Deleted

## 2016-06-28 DIAGNOSIS — J309 Allergic rhinitis, unspecified: Secondary | ICD-10-CM | POA: Diagnosis not present

## 2016-07-06 ENCOUNTER — Ambulatory Visit (INDEPENDENT_AMBULATORY_CARE_PROVIDER_SITE_OTHER)

## 2016-07-06 DIAGNOSIS — J309 Allergic rhinitis, unspecified: Secondary | ICD-10-CM

## 2016-07-12 ENCOUNTER — Encounter: Payer: Self-pay | Admitting: *Deleted

## 2016-07-12 NOTE — Addendum Note (Signed)
Addended by: Felipa Emory on: 07/12/2016 03:35 PM   Modules accepted: Orders

## 2016-07-13 ENCOUNTER — Encounter: Payer: Self-pay | Admitting: *Deleted

## 2016-07-14 ENCOUNTER — Encounter: Payer: Self-pay | Admitting: *Deleted

## 2016-07-14 DIAGNOSIS — J3089 Other allergic rhinitis: Secondary | ICD-10-CM | POA: Diagnosis not present

## 2016-07-17 ENCOUNTER — Ambulatory Visit (INDEPENDENT_AMBULATORY_CARE_PROVIDER_SITE_OTHER): Admitting: *Deleted

## 2016-07-17 DIAGNOSIS — J309 Allergic rhinitis, unspecified: Secondary | ICD-10-CM

## 2016-07-27 ENCOUNTER — Ambulatory Visit (INDEPENDENT_AMBULATORY_CARE_PROVIDER_SITE_OTHER): Admitting: *Deleted

## 2016-07-27 DIAGNOSIS — J309 Allergic rhinitis, unspecified: Secondary | ICD-10-CM

## 2016-08-09 ENCOUNTER — Ambulatory Visit (INDEPENDENT_AMBULATORY_CARE_PROVIDER_SITE_OTHER): Admitting: *Deleted

## 2016-08-09 DIAGNOSIS — J309 Allergic rhinitis, unspecified: Secondary | ICD-10-CM | POA: Diagnosis not present

## 2016-08-16 ENCOUNTER — Ambulatory Visit (INDEPENDENT_AMBULATORY_CARE_PROVIDER_SITE_OTHER)

## 2016-08-16 DIAGNOSIS — J309 Allergic rhinitis, unspecified: Secondary | ICD-10-CM

## 2016-08-21 ENCOUNTER — Ambulatory Visit (INDEPENDENT_AMBULATORY_CARE_PROVIDER_SITE_OTHER): Admitting: *Deleted

## 2016-08-21 DIAGNOSIS — J309 Allergic rhinitis, unspecified: Secondary | ICD-10-CM | POA: Diagnosis not present

## 2016-08-28 ENCOUNTER — Ambulatory Visit (INDEPENDENT_AMBULATORY_CARE_PROVIDER_SITE_OTHER)

## 2016-08-28 DIAGNOSIS — J309 Allergic rhinitis, unspecified: Secondary | ICD-10-CM | POA: Diagnosis not present

## 2016-09-13 ENCOUNTER — Ambulatory Visit (INDEPENDENT_AMBULATORY_CARE_PROVIDER_SITE_OTHER)

## 2016-09-13 DIAGNOSIS — J309 Allergic rhinitis, unspecified: Secondary | ICD-10-CM | POA: Diagnosis not present

## 2016-09-18 ENCOUNTER — Ambulatory Visit (INDEPENDENT_AMBULATORY_CARE_PROVIDER_SITE_OTHER)

## 2016-09-18 DIAGNOSIS — J309 Allergic rhinitis, unspecified: Secondary | ICD-10-CM

## 2016-10-05 ENCOUNTER — Ambulatory Visit (INDEPENDENT_AMBULATORY_CARE_PROVIDER_SITE_OTHER): Admitting: *Deleted

## 2016-10-05 DIAGNOSIS — J309 Allergic rhinitis, unspecified: Secondary | ICD-10-CM

## 2016-10-12 ENCOUNTER — Ambulatory Visit (INDEPENDENT_AMBULATORY_CARE_PROVIDER_SITE_OTHER): Admitting: *Deleted

## 2016-10-12 DIAGNOSIS — J309 Allergic rhinitis, unspecified: Secondary | ICD-10-CM

## 2016-10-23 ENCOUNTER — Ambulatory Visit (INDEPENDENT_AMBULATORY_CARE_PROVIDER_SITE_OTHER)

## 2016-10-23 DIAGNOSIS — J309 Allergic rhinitis, unspecified: Secondary | ICD-10-CM

## 2016-10-25 ENCOUNTER — Telehealth: Payer: Self-pay | Admitting: Allergy and Immunology

## 2016-10-25 NOTE — Telephone Encounter (Signed)
Please call patient back regarding payment arrangements she wants to make.

## 2016-10-26 NOTE — Telephone Encounter (Signed)
Pt has a balance on Med Mgr - will pay $50/mo beginning 11-09-16 - asked her to pay around the same time each month - kt

## 2016-10-26 NOTE — Telephone Encounter (Signed)
Left message to call me back - kt °

## 2016-11-02 ENCOUNTER — Ambulatory Visit (INDEPENDENT_AMBULATORY_CARE_PROVIDER_SITE_OTHER): Admitting: *Deleted

## 2016-11-02 DIAGNOSIS — J309 Allergic rhinitis, unspecified: Secondary | ICD-10-CM | POA: Diagnosis not present

## 2016-11-09 ENCOUNTER — Ambulatory Visit (INDEPENDENT_AMBULATORY_CARE_PROVIDER_SITE_OTHER): Admitting: *Deleted

## 2016-11-09 DIAGNOSIS — J309 Allergic rhinitis, unspecified: Secondary | ICD-10-CM

## 2016-11-23 ENCOUNTER — Ambulatory Visit (INDEPENDENT_AMBULATORY_CARE_PROVIDER_SITE_OTHER): Admitting: *Deleted

## 2016-11-23 DIAGNOSIS — J309 Allergic rhinitis, unspecified: Secondary | ICD-10-CM

## 2016-11-30 ENCOUNTER — Ambulatory Visit (INDEPENDENT_AMBULATORY_CARE_PROVIDER_SITE_OTHER): Admitting: *Deleted

## 2016-11-30 DIAGNOSIS — J309 Allergic rhinitis, unspecified: Secondary | ICD-10-CM | POA: Diagnosis not present

## 2016-12-15 ENCOUNTER — Ambulatory Visit (INDEPENDENT_AMBULATORY_CARE_PROVIDER_SITE_OTHER)

## 2016-12-15 DIAGNOSIS — J309 Allergic rhinitis, unspecified: Secondary | ICD-10-CM

## 2016-12-21 ENCOUNTER — Ambulatory Visit (INDEPENDENT_AMBULATORY_CARE_PROVIDER_SITE_OTHER)

## 2016-12-21 DIAGNOSIS — J309 Allergic rhinitis, unspecified: Secondary | ICD-10-CM | POA: Diagnosis not present

## 2017-01-04 ENCOUNTER — Encounter: Payer: Self-pay | Admitting: *Deleted

## 2017-01-04 NOTE — Progress Notes (Signed)
Maintenance vials made

## 2017-01-05 ENCOUNTER — Ambulatory Visit (INDEPENDENT_AMBULATORY_CARE_PROVIDER_SITE_OTHER)

## 2017-01-05 DIAGNOSIS — J309 Allergic rhinitis, unspecified: Secondary | ICD-10-CM

## 2017-01-05 DIAGNOSIS — J301 Allergic rhinitis due to pollen: Secondary | ICD-10-CM | POA: Diagnosis not present

## 2017-01-11 ENCOUNTER — Ambulatory Visit (INDEPENDENT_AMBULATORY_CARE_PROVIDER_SITE_OTHER): Admitting: *Deleted

## 2017-01-11 DIAGNOSIS — J309 Allergic rhinitis, unspecified: Secondary | ICD-10-CM | POA: Diagnosis not present

## 2017-01-18 ENCOUNTER — Ambulatory Visit (INDEPENDENT_AMBULATORY_CARE_PROVIDER_SITE_OTHER)

## 2017-01-18 DIAGNOSIS — J309 Allergic rhinitis, unspecified: Secondary | ICD-10-CM

## 2017-01-18 NOTE — Progress Notes (Signed)
Reprint labels 

## 2017-01-18 NOTE — Progress Notes (Signed)
NEW LABEL

## 2017-01-25 ENCOUNTER — Ambulatory Visit (INDEPENDENT_AMBULATORY_CARE_PROVIDER_SITE_OTHER): Admitting: *Deleted

## 2017-01-25 DIAGNOSIS — J309 Allergic rhinitis, unspecified: Secondary | ICD-10-CM | POA: Diagnosis not present

## 2017-02-01 ENCOUNTER — Ambulatory Visit (INDEPENDENT_AMBULATORY_CARE_PROVIDER_SITE_OTHER): Admitting: *Deleted

## 2017-02-01 DIAGNOSIS — J309 Allergic rhinitis, unspecified: Secondary | ICD-10-CM

## 2017-02-07 ENCOUNTER — Ambulatory Visit (INDEPENDENT_AMBULATORY_CARE_PROVIDER_SITE_OTHER)

## 2017-02-07 DIAGNOSIS — J309 Allergic rhinitis, unspecified: Secondary | ICD-10-CM | POA: Diagnosis not present

## 2017-02-15 ENCOUNTER — Ambulatory Visit (INDEPENDENT_AMBULATORY_CARE_PROVIDER_SITE_OTHER): Admitting: *Deleted

## 2017-02-15 DIAGNOSIS — J309 Allergic rhinitis, unspecified: Secondary | ICD-10-CM | POA: Diagnosis not present

## 2017-02-22 ENCOUNTER — Ambulatory Visit (INDEPENDENT_AMBULATORY_CARE_PROVIDER_SITE_OTHER)

## 2017-02-22 DIAGNOSIS — J309 Allergic rhinitis, unspecified: Secondary | ICD-10-CM

## 2017-03-09 ENCOUNTER — Ambulatory Visit (INDEPENDENT_AMBULATORY_CARE_PROVIDER_SITE_OTHER)

## 2017-03-09 DIAGNOSIS — J309 Allergic rhinitis, unspecified: Secondary | ICD-10-CM | POA: Diagnosis not present

## 2017-03-29 DIAGNOSIS — F3161 Bipolar disorder, current episode mixed, mild: Secondary | ICD-10-CM

## 2017-03-29 HISTORY — DX: Bipolar disorder, current episode mixed, mild: F31.61

## 2017-04-05 ENCOUNTER — Ambulatory Visit (INDEPENDENT_AMBULATORY_CARE_PROVIDER_SITE_OTHER): Admitting: *Deleted

## 2017-04-05 DIAGNOSIS — J309 Allergic rhinitis, unspecified: Secondary | ICD-10-CM | POA: Diagnosis not present

## 2017-04-12 ENCOUNTER — Ambulatory Visit (INDEPENDENT_AMBULATORY_CARE_PROVIDER_SITE_OTHER): Admitting: *Deleted

## 2017-04-12 DIAGNOSIS — J309 Allergic rhinitis, unspecified: Secondary | ICD-10-CM | POA: Diagnosis not present

## 2017-04-19 ENCOUNTER — Ambulatory Visit (INDEPENDENT_AMBULATORY_CARE_PROVIDER_SITE_OTHER): Admitting: *Deleted

## 2017-04-19 DIAGNOSIS — J309 Allergic rhinitis, unspecified: Secondary | ICD-10-CM | POA: Diagnosis not present

## 2017-04-24 DIAGNOSIS — J3089 Other allergic rhinitis: Secondary | ICD-10-CM | POA: Diagnosis not present

## 2017-04-25 DIAGNOSIS — J301 Allergic rhinitis due to pollen: Secondary | ICD-10-CM | POA: Diagnosis not present

## 2017-04-26 NOTE — Progress Notes (Signed)
VIALS EXP 04-27-18

## 2017-04-30 ENCOUNTER — Ambulatory Visit (INDEPENDENT_AMBULATORY_CARE_PROVIDER_SITE_OTHER): Admitting: *Deleted

## 2017-04-30 DIAGNOSIS — J309 Allergic rhinitis, unspecified: Secondary | ICD-10-CM

## 2017-05-10 ENCOUNTER — Ambulatory Visit (INDEPENDENT_AMBULATORY_CARE_PROVIDER_SITE_OTHER): Admitting: *Deleted

## 2017-05-10 DIAGNOSIS — J309 Allergic rhinitis, unspecified: Secondary | ICD-10-CM | POA: Diagnosis not present

## 2017-05-17 DIAGNOSIS — J3089 Other allergic rhinitis: Secondary | ICD-10-CM | POA: Diagnosis not present

## 2017-05-17 NOTE — Progress Notes (Signed)
VIALS EXP 05-17-18

## 2017-05-31 ENCOUNTER — Ambulatory Visit (INDEPENDENT_AMBULATORY_CARE_PROVIDER_SITE_OTHER): Admitting: *Deleted

## 2017-05-31 DIAGNOSIS — J309 Allergic rhinitis, unspecified: Secondary | ICD-10-CM

## 2017-06-11 ENCOUNTER — Ambulatory Visit (INDEPENDENT_AMBULATORY_CARE_PROVIDER_SITE_OTHER): Admitting: *Deleted

## 2017-06-11 DIAGNOSIS — J309 Allergic rhinitis, unspecified: Secondary | ICD-10-CM | POA: Diagnosis not present

## 2017-06-21 ENCOUNTER — Ambulatory Visit (INDEPENDENT_AMBULATORY_CARE_PROVIDER_SITE_OTHER): Admitting: *Deleted

## 2017-06-21 DIAGNOSIS — J309 Allergic rhinitis, unspecified: Secondary | ICD-10-CM

## 2017-06-28 ENCOUNTER — Ambulatory Visit (INDEPENDENT_AMBULATORY_CARE_PROVIDER_SITE_OTHER): Admitting: *Deleted

## 2017-06-28 DIAGNOSIS — J309 Allergic rhinitis, unspecified: Secondary | ICD-10-CM | POA: Diagnosis not present

## 2017-07-18 DIAGNOSIS — N941 Unspecified dyspareunia: Secondary | ICD-10-CM | POA: Diagnosis not present

## 2017-07-18 DIAGNOSIS — N9419 Other specified dyspareunia: Secondary | ICD-10-CM | POA: Diagnosis not present

## 2017-07-24 ENCOUNTER — Ambulatory Visit (INDEPENDENT_AMBULATORY_CARE_PROVIDER_SITE_OTHER): Admitting: *Deleted

## 2017-07-24 DIAGNOSIS — J309 Allergic rhinitis, unspecified: Secondary | ICD-10-CM

## 2017-07-30 ENCOUNTER — Ambulatory Visit (INDEPENDENT_AMBULATORY_CARE_PROVIDER_SITE_OTHER): Payer: Medicare Other | Admitting: *Deleted

## 2017-07-30 DIAGNOSIS — J309 Allergic rhinitis, unspecified: Secondary | ICD-10-CM

## 2017-08-09 ENCOUNTER — Ambulatory Visit (INDEPENDENT_AMBULATORY_CARE_PROVIDER_SITE_OTHER): Payer: Medicare Other | Admitting: *Deleted

## 2017-08-09 DIAGNOSIS — J309 Allergic rhinitis, unspecified: Secondary | ICD-10-CM

## 2017-08-16 ENCOUNTER — Ambulatory Visit (INDEPENDENT_AMBULATORY_CARE_PROVIDER_SITE_OTHER): Payer: Medicare Other | Admitting: *Deleted

## 2017-08-16 DIAGNOSIS — J309 Allergic rhinitis, unspecified: Secondary | ICD-10-CM | POA: Diagnosis not present

## 2017-08-22 ENCOUNTER — Ambulatory Visit (INDEPENDENT_AMBULATORY_CARE_PROVIDER_SITE_OTHER): Payer: Medicare Other | Admitting: *Deleted

## 2017-08-22 DIAGNOSIS — J309 Allergic rhinitis, unspecified: Secondary | ICD-10-CM | POA: Diagnosis not present

## 2017-08-23 DIAGNOSIS — F314 Bipolar disorder, current episode depressed, severe, without psychotic features: Secondary | ICD-10-CM | POA: Diagnosis not present

## 2017-08-30 ENCOUNTER — Ambulatory Visit (INDEPENDENT_AMBULATORY_CARE_PROVIDER_SITE_OTHER): Payer: Medicare Other

## 2017-08-30 DIAGNOSIS — J309 Allergic rhinitis, unspecified: Secondary | ICD-10-CM

## 2017-09-11 ENCOUNTER — Ambulatory Visit (INDEPENDENT_AMBULATORY_CARE_PROVIDER_SITE_OTHER): Payer: Medicare Other | Admitting: *Deleted

## 2017-09-11 DIAGNOSIS — J309 Allergic rhinitis, unspecified: Secondary | ICD-10-CM

## 2017-09-18 ENCOUNTER — Ambulatory Visit (INDEPENDENT_AMBULATORY_CARE_PROVIDER_SITE_OTHER): Payer: Medicare Other | Admitting: *Deleted

## 2017-09-18 DIAGNOSIS — J309 Allergic rhinitis, unspecified: Secondary | ICD-10-CM

## 2017-09-19 DIAGNOSIS — Z0000 Encounter for general adult medical examination without abnormal findings: Secondary | ICD-10-CM | POA: Diagnosis not present

## 2017-09-19 DIAGNOSIS — F3161 Bipolar disorder, current episode mixed, mild: Secondary | ICD-10-CM | POA: Diagnosis not present

## 2017-09-19 DIAGNOSIS — Z13220 Encounter for screening for lipoid disorders: Secondary | ICD-10-CM | POA: Diagnosis not present

## 2017-09-19 DIAGNOSIS — F603 Borderline personality disorder: Secondary | ICD-10-CM | POA: Diagnosis not present

## 2017-09-19 DIAGNOSIS — R630 Anorexia: Secondary | ICD-10-CM | POA: Diagnosis not present

## 2017-09-28 ENCOUNTER — Encounter: Payer: Self-pay | Admitting: *Deleted

## 2017-09-28 DIAGNOSIS — J301 Allergic rhinitis due to pollen: Secondary | ICD-10-CM | POA: Diagnosis not present

## 2017-09-28 NOTE — Progress Notes (Signed)
Maintenance vial made. Exp: 09-29-18. hv

## 2017-10-01 DIAGNOSIS — J3089 Other allergic rhinitis: Secondary | ICD-10-CM | POA: Diagnosis not present

## 2017-10-03 ENCOUNTER — Ambulatory Visit (INDEPENDENT_AMBULATORY_CARE_PROVIDER_SITE_OTHER): Payer: Medicare Other

## 2017-10-03 DIAGNOSIS — J309 Allergic rhinitis, unspecified: Secondary | ICD-10-CM

## 2017-10-04 DIAGNOSIS — F3112 Bipolar disorder, current episode manic without psychotic features, moderate: Secondary | ICD-10-CM | POA: Diagnosis not present

## 2017-10-12 ENCOUNTER — Ambulatory Visit (INDEPENDENT_AMBULATORY_CARE_PROVIDER_SITE_OTHER): Payer: Medicare Other

## 2017-10-12 DIAGNOSIS — J309 Allergic rhinitis, unspecified: Secondary | ICD-10-CM | POA: Diagnosis not present

## 2017-10-17 DIAGNOSIS — F314 Bipolar disorder, current episode depressed, severe, without psychotic features: Secondary | ICD-10-CM | POA: Diagnosis not present

## 2017-10-19 ENCOUNTER — Ambulatory Visit (INDEPENDENT_AMBULATORY_CARE_PROVIDER_SITE_OTHER): Payer: Medicare Other

## 2017-10-19 DIAGNOSIS — J309 Allergic rhinitis, unspecified: Secondary | ICD-10-CM | POA: Diagnosis not present

## 2017-10-31 ENCOUNTER — Ambulatory Visit (INDEPENDENT_AMBULATORY_CARE_PROVIDER_SITE_OTHER): Payer: Medicare Other

## 2017-10-31 DIAGNOSIS — J309 Allergic rhinitis, unspecified: Secondary | ICD-10-CM | POA: Diagnosis not present

## 2017-11-01 DIAGNOSIS — F3112 Bipolar disorder, current episode manic without psychotic features, moderate: Secondary | ICD-10-CM | POA: Diagnosis not present

## 2017-11-05 ENCOUNTER — Ambulatory Visit (INDEPENDENT_AMBULATORY_CARE_PROVIDER_SITE_OTHER): Payer: Medicare Other | Admitting: *Deleted

## 2017-11-05 DIAGNOSIS — J309 Allergic rhinitis, unspecified: Secondary | ICD-10-CM | POA: Diagnosis not present

## 2017-11-22 DIAGNOSIS — F3112 Bipolar disorder, current episode manic without psychotic features, moderate: Secondary | ICD-10-CM | POA: Diagnosis not present

## 2017-11-27 ENCOUNTER — Ambulatory Visit (INDEPENDENT_AMBULATORY_CARE_PROVIDER_SITE_OTHER): Payer: Medicare Other | Admitting: *Deleted

## 2017-11-27 DIAGNOSIS — J309 Allergic rhinitis, unspecified: Secondary | ICD-10-CM | POA: Diagnosis not present

## 2017-12-04 ENCOUNTER — Ambulatory Visit (INDEPENDENT_AMBULATORY_CARE_PROVIDER_SITE_OTHER): Payer: Medicare Other | Admitting: *Deleted

## 2017-12-04 DIAGNOSIS — J309 Allergic rhinitis, unspecified: Secondary | ICD-10-CM | POA: Diagnosis not present

## 2017-12-08 ENCOUNTER — Encounter: Payer: Self-pay | Admitting: *Deleted

## 2017-12-11 ENCOUNTER — Ambulatory Visit (INDEPENDENT_AMBULATORY_CARE_PROVIDER_SITE_OTHER): Payer: Medicare Other | Admitting: *Deleted

## 2017-12-11 DIAGNOSIS — J309 Allergic rhinitis, unspecified: Secondary | ICD-10-CM | POA: Diagnosis not present

## 2017-12-20 ENCOUNTER — Ambulatory Visit (INDEPENDENT_AMBULATORY_CARE_PROVIDER_SITE_OTHER): Payer: Medicare Other | Admitting: *Deleted

## 2017-12-20 DIAGNOSIS — J309 Allergic rhinitis, unspecified: Secondary | ICD-10-CM | POA: Diagnosis not present

## 2017-12-26 ENCOUNTER — Ambulatory Visit (INDEPENDENT_AMBULATORY_CARE_PROVIDER_SITE_OTHER): Payer: Medicare Other

## 2017-12-26 DIAGNOSIS — J309 Allergic rhinitis, unspecified: Secondary | ICD-10-CM | POA: Diagnosis not present

## 2017-12-31 ENCOUNTER — Ambulatory Visit (INDEPENDENT_AMBULATORY_CARE_PROVIDER_SITE_OTHER): Payer: Medicare Other | Admitting: Physician Assistant

## 2017-12-31 ENCOUNTER — Encounter: Payer: Self-pay | Admitting: Physician Assistant

## 2017-12-31 DIAGNOSIS — F411 Generalized anxiety disorder: Secondary | ICD-10-CM

## 2017-12-31 DIAGNOSIS — F319 Bipolar disorder, unspecified: Secondary | ICD-10-CM | POA: Diagnosis not present

## 2017-12-31 NOTE — Patient Instructions (Signed)
Therapy lamp 30 minutes every morning  Over the counter Vitamin D, 2,000 IU daily.

## 2017-12-31 NOTE — Progress Notes (Signed)
[facility]  Patient ID: Candace Browning,  MRN: [id number]  PCP: Bartholome Bill, MD  Date of Evaluation: 12/31/2017 Time spent:15 minutes   HISTORY/CURRENT STATUS: HPI Candace Browning presents alone for routine med check.  She was last seen 3 months ago.  Overall, states she is been doing very well.  She continues to work part-time at [employer] and that is going well.  She usually sleeps very well. In the past month however, she has had several days where she would be pressed for no reason and then suddenly on the second or third day she would feel much better.  She states she just felt "off".  States is not earth shattering, nothing like she has had in the past but it has not been her normal.  She states sometimes she gets like this in the fall of the year.  Energy and motivation have been good.  She is not sleeping too little or too much.  Her appetite is normal and she is not losing anymore weight. Denies increased energy with decreased need for sleep.  No increased libido, spending, impulsivity, risky behavior, grandiosity, or other manic symptoms. She denies any hallucinations, or SI/HI  Individual Medical History/ Review of Systems: Changes? :No  Allergies: Apple; Coconut oil; Gluten meal; Latex; Nsaids; Other; Peanut-containing drug products; Sulfa antibiotics; Hydrogen peroxide; Penicillins; and Wheat bran  Current Medications:  Current Outpatient Medications:  .  albuterol (PROVENTIL HFA;VENTOLIN HFA) 108 (90 Base) MCG/ACT inhaler, Inhale into the lungs., Disp: , Rfl:  .  Azelastine HCl 0.15 % SOLN, , Disp: , Rfl:  .  buPROPion (WELLBUTRIN XL) 300 MG 24 hr tablet, Take 300 mg by mouth daily., Disp: , Rfl:  .  fluticasone (FLONASE) 50 MCG/ACT nasal spray, , Disp: , Rfl:  .  albuterol (PROVENTIL HFA;VENTOLIN HFA) 108 (90 Base) MCG/ACT inhaler, Inhale 2 puffs into the lungs every 6 (six) hours as needed., Disp: , Rfl:  .  ALPRAZolam (XANAX) 0.25 MG tablet, Take 0.25 mg  by mouth 3 (three) times daily as needed for anxiety., Disp: , Rfl:  .  busPIRone (BUSPAR) 15 MG tablet, Take 1 tablet (15 mg total) by mouth 2 (two) times daily., Disp: 60 tablet, Rfl: 1 .  EPINEPHrine 0.3 mg/0.3 mL IJ SOAJ injection, Inject 0.3 mg into the muscle as needed., Disp: , Rfl:  .  levocetirizine (XYZAL) 5 MG tablet, TAKE ONE TABLET BY MOUTH EVERY EVENING, Disp: 30 tablet, Rfl: 3 .  valACYclovir (VALTREX) 500 MG tablet, Take 1 tablet (500 mg total) by mouth 2 (two) times daily as needed (outbreaks)., Disp: 30 tablet, Rfl: 0 Medication Side Effects: None  Family Medical/ Social History: Changes? No  MENTAL HEALTH EXAM:  There were no vitals taken for this visit.There is no height or weight on file to calculate BMI.  General Appearance: Fairly Groomed and Well Groomed  Eye Contact:  Good  Speech:  Clear and Coherent  Volume:  Normal  Mood:  Euthymic  Affect:  Appropriate  Thought Process:  Coherent  Orientation:  Full (Time, Place, and Person)  Thought Content: Logical   Suicidal Thoughts:  No  Homicidal Thoughts:  No  Memory:  Immediate  Judgement:  Good  Insight:  Good  Psychomotor Activity:  Normal  Concentration:  Concentration: Good  Recall:  Good  Fund of Knowledge: Good  Language: Good  Akathisia:  No  AIMS (if indicated): not done  Assets:  Desire for Improvement  ADL's:  Intact  Cognition: WNL  Prognosis:  Good    DIAGNOSES:    ICD-10-CM   1. Bipolar I disorder ([place]) F31.9   2. Generalized anxiety disorder F41.1     RECOMMENDATIONS: Once again, we have discussed the fact that with bipolar disorder she needs either an atypical antipsychotic and/or a mood stabilizer.  She does not want to take that at present.  She has had several different mood stabilizers and antipsychotic in the past, with side effects or problems with meaning of them and she does not want to take them now.  He understands the risk of mania and/or depression.  She states that she  prefers to stay on the Wellbutrin, BuSpar, and Xanax as needed and will watch her condition, which I agree. I do recommend a therapy lamp to use 30 minutes every morning which helps with depression. Also add OTC vitamin D 1000 IUs twice daily. Return in 4 weeks for reevaluation.     Donnal Moat, PA-C

## 2018-01-02 ENCOUNTER — Ambulatory Visit (INDEPENDENT_AMBULATORY_CARE_PROVIDER_SITE_OTHER): Payer: Medicare Other | Admitting: *Deleted

## 2018-01-02 DIAGNOSIS — J309 Allergic rhinitis, unspecified: Secondary | ICD-10-CM

## 2018-01-04 DIAGNOSIS — F3112 Bipolar disorder, current episode manic without psychotic features, moderate: Secondary | ICD-10-CM | POA: Diagnosis not present

## 2018-01-07 ENCOUNTER — Ambulatory Visit (INDEPENDENT_AMBULATORY_CARE_PROVIDER_SITE_OTHER): Payer: Medicare Other

## 2018-01-07 DIAGNOSIS — J309 Allergic rhinitis, unspecified: Secondary | ICD-10-CM | POA: Diagnosis not present

## 2018-01-14 ENCOUNTER — Ambulatory Visit (INDEPENDENT_AMBULATORY_CARE_PROVIDER_SITE_OTHER): Payer: Medicare Other | Admitting: Allergy and Immunology

## 2018-01-14 ENCOUNTER — Encounter: Payer: Self-pay | Admitting: Allergy and Immunology

## 2018-01-14 VITALS — BP 104/70 | HR 98 | Temp 98.4°F | Resp 18 | Ht 66.5 in | Wt 121.0 lb

## 2018-01-14 DIAGNOSIS — J4520 Mild intermittent asthma, uncomplicated: Secondary | ICD-10-CM | POA: Diagnosis not present

## 2018-01-14 DIAGNOSIS — L2089 Other atopic dermatitis: Secondary | ICD-10-CM | POA: Diagnosis not present

## 2018-01-14 DIAGNOSIS — H1010 Acute atopic conjunctivitis, unspecified eye: Secondary | ICD-10-CM | POA: Insufficient documentation

## 2018-01-14 DIAGNOSIS — H1013 Acute atopic conjunctivitis, bilateral: Secondary | ICD-10-CM

## 2018-01-14 DIAGNOSIS — J3089 Other allergic rhinitis: Secondary | ICD-10-CM

## 2018-01-14 DIAGNOSIS — K219 Gastro-esophageal reflux disease without esophagitis: Secondary | ICD-10-CM

## 2018-01-14 DIAGNOSIS — J309 Allergic rhinitis, unspecified: Secondary | ICD-10-CM

## 2018-01-14 MED ORDER — ALBUTEROL SULFATE HFA 108 (90 BASE) MCG/ACT IN AERS: 2.0000 | INHALATION_SPRAY | Freq: Four times a day (QID) | RESPIRATORY_TRACT | 1 refills | Status: DC | PRN

## 2018-01-14 NOTE — Patient Instructions (Addendum)
Mild intermittent asthma  For now, continue albuterol HFA, 1 to 2 inhalations every 4-6 hours as needed.  If nocturnal awakenings due to asthma become more frequent than 2 times per month, we will restart montelukast.  Subjective and objective measures of pulmonary function will be followed and the treatment plan will be adjusted accordingly.  Allergic rhinitis Improved.  Continue appropriate allergen avoidance measures, immunotherapy injections as prescribed and as tolerated, levocetirizine as needed, and fluticasone nasal spray if needed.  GERD (gastroesophageal reflux disease)  Continue appropriate reflux lifestyle modifications.  If this problem recurs/progresses, we will restart proton pump inhibitor.  Atopic dermatitis  Continue appropriate skin care measures and Lidex sparingly to affected areas if needed.   Return in about 1 year (around 01/15/2019), or if symptoms worsen or fail to improve.

## 2018-01-14 NOTE — Assessment & Plan Note (Signed)
Improved.  Continue appropriate allergen avoidance measures, immunotherapy injections as prescribed and as tolerated, levocetirizine as needed, and fluticasone nasal spray if needed.

## 2018-01-14 NOTE — Assessment & Plan Note (Signed)
   Continue appropriate skin care measures and Lidex sparingly to affected areas if needed.

## 2018-01-14 NOTE — Assessment & Plan Note (Signed)
   For now, continue albuterol HFA, 1 to 2 inhalations every 4-6 hours as needed.  If nocturnal awakenings due to asthma become more frequent than 2 times per month, we will restart montelukast.  Subjective and objective measures of pulmonary function will be followed and the treatment plan will be adjusted accordingly.

## 2018-01-14 NOTE — Assessment & Plan Note (Signed)
   Continue appropriate reflux lifestyle modifications.  If this problem recurs/progresses, we will restart proton pump inhibitor.

## 2018-01-14 NOTE — Progress Notes (Signed)
Follow-up Note  RE: Candace Browning MRN: [id number] DOB: [birth year] Date of Office Visit: 01/14/2018  Primary care provider: Bartholome Bill, MD Referring provider: Bartholome Bill, MD  History of present illness: Candace Browning is a 37 y.o. female with allergic rhinoconjunctivitis and asthma presenting today for follow-up.  She was last seen in this clinic in April 2017.  She reports that her upper and lower respiratory symptoms have improved significantly while on aeroallergen immunotherapy injections.  She currently takes levocetirizine or loratadine as needed.  She no longer requires montelukast, medicated nasal sprays, or olopatadine eyedrops.  She reports that she typically requires albuterol rescue 2 times per month on average.  She does not experience limitations in normal daily activities.  She experiences nocturnal awakenings due to lower respiratory symptoms 1 or 2 times per month on average.  She has no eczema related complaints today.  Assessment and plan: Mild intermittent asthma  For now, continue albuterol HFA, 1 to 2 inhalations every 4-6 hours as needed.  If nocturnal awakenings due to asthma become more frequent than 2 times per month, we will restart montelukast.  Subjective and objective measures of pulmonary function will be followed and the treatment plan will be adjusted accordingly.  Allergic rhinitis Improved.  Continue appropriate allergen avoidance measures, immunotherapy injections as prescribed and as tolerated, levocetirizine as needed, and fluticasone nasal spray if needed.  GERD (gastroesophageal reflux disease)  Continue appropriate reflux lifestyle modifications.  If this problem recurs/progresses, we will restart proton pump inhibitor.  Atopic dermatitis  Continue appropriate skin care measures and Lidex sparingly to affected areas if needed.   Meds ordered this encounter  Medications  . albuterol (PROVENTIL HFA;VENTOLIN HFA) 108  (90 Base) MCG/ACT inhaler    Sig: Inhale 2 puffs into the lungs every 6 (six) hours as needed.    Dispense:  18 g    Refill:  1    Diagnostics: Spirometry:  Normal with an FEV1 of 95% predicted.  Please see scanned spirometry results for details.    Physical examination: Blood pressure 104/70, pulse 98, temperature 98.4 F (36.9 C), temperature source Oral, resp. rate 18, height 5' 6.5" (1.689 m), weight 121 lb (54.9 kg), SpO2 99 %.  General: Alert, interactive, in no acute distress. HEENT: TMs pearly gray, turbinates minimally edematous without discharge, post-pharynx unremarkable. Neck: Supple without lymphadenopathy. Lungs: Clear to auscultation without wheezing, rhonchi or rales. CV: Normal S1, S2 without murmurs. Skin: Warm and dry, without lesions or rashes.  The following portions of the patient's history were reviewed and updated as appropriate: allergies, current medications, past family history, past medical history, past social history, past surgical history and problem list.  Allergies as of 01/14/2018      Reactions   Apple Anaphylaxis   Coconut Oil    Gluten Meal    All breads   Latex    Nsaids Other (See Comments)   Thin basement membrane disease, advised to avoid NSAIDS   Other    PT HAD FOOD ALLERGIES:APPLES, WHEAT, AND all nuts   Peanut-containing Drug Products    Sulfa Antibiotics    Hydrogen Peroxide Itching, Rash   Penicillins Rash   Has patient had a PCN reaction causing immediate rash, facial/tongue/throat swelling, SOB or lightheadedness with hypotension: yes  Has patient had a PCN reaction causing severe rash involving mucus membranes or skin necrosis: no Has patient had a PCN reaction that required hospitalization: no  Has patient had a PCN reaction occurring within  the last 10 years: no If all of the above answers are "NO", then may proceed with Cephalosporin use.   Wheat Bran Rash      Medication List        Accurate as of 01/14/18  8:54  PM. Always use your most recent med list.          albuterol 108 (90 Base) MCG/ACT inhaler Commonly known as:  PROVENTIL HFA;VENTOLIN HFA Inhale 2 puffs into the lungs every 6 (six) hours as needed.   ALPRAZolam 0.25 MG tablet Commonly known as:  XANAX Take 0.25 mg by mouth 3 (three) times daily as needed for anxiety.   buPROPion 300 MG 24 hr tablet Commonly known as:  WELLBUTRIN XL Take 300 mg by mouth daily.   busPIRone 15 MG tablet Commonly known as:  BUSPAR Take 1 tablet (15 mg total) by mouth 2 (two) times daily.   EPINEPHrine 0.3 mg/0.3 mL Soaj injection Commonly known as:  EPI-PEN Inject 0.3 mg into the muscle as needed.   levocetirizine 5 MG tablet Commonly known as:  XYZAL TAKE ONE TABLET BY MOUTH EVERY EVENING   loratadine 10 MG tablet Commonly known as:  CLARITIN Take 10 mg by mouth daily.   valACYclovir 500 MG tablet Commonly known as:  VALTREX Take 1 tablet (500 mg total) by mouth 2 (two) times daily as needed (outbreaks).       Allergies  Allergen Reactions  . Apple Anaphylaxis  . Coconut Oil   . Gluten Meal     All breads  . Latex   . Nsaids Other (See Comments)    Thin basement membrane disease, advised to avoid NSAIDS  . Other     PT HAD FOOD ALLERGIES:APPLES, WHEAT, AND all nuts  . Peanut-Containing Drug Products   . Sulfa Antibiotics   . Hydrogen Peroxide Itching and Rash  . Penicillins Rash    Has patient had a PCN reaction causing immediate rash, facial/tongue/throat swelling, SOB or lightheadedness with hypotension: yes  Has patient had a PCN reaction causing severe rash involving mucus membranes or skin necrosis: no Has patient had a PCN reaction that required hospitalization: no  Has patient had a PCN reaction occurring within the last 10 years: no If all of the above answers are "NO", then may proceed with Cephalosporin use.   . Wheat Bran Rash   Review of systems: Review of systems negative except as noted in HPI / PMHx or  noted below: Constitutional: Negative.  HENT: Negative.   Eyes: Negative.  Respiratory: Negative.   Cardiovascular: Negative.  Gastrointestinal: Negative.  Genitourinary: Negative.  Musculoskeletal: Negative.  Neurological: Negative.  Endo/Heme/Allergies: Negative.  Cutaneous: Negative.  Past Medical History:  Diagnosis Date  . Allergic rhinitis   . Anemia   . Anxiety   . Asthma   . Atopic dermatitis   . Bipolar 1 disorder ([place])   . Eczema   . Food allergy   . Herpes simplex without mention of complication    HSV2  . Renal disorder     Family History  Problem Relation Age of Onset  . Asthma Mother   . Anemia Mother   . Arthritis Mother        HAD KNEE REPLACEMENT  . Diabetes Maternal Grandmother   . Arthritis Maternal Grandmother        KNEE REPLACEMENT    Social History   Socioeconomic History  . Marital status: Single    Spouse name: Not on file  . Number  of children: Not on file  . Years of education: Not on file  . Highest education level: Not on file  Occupational History  . Not on file  Social Needs  . Financial resource strain: Not on file  . Food insecurity:    Worry: Not on file    Inability: Not on file  . Transportation needs:    Medical: Not on file    Non-medical: Not on file  Tobacco Use  . Smoking status: Never Smoker  . Smokeless tobacco: Never Used  Substance and Sexual Activity  . Alcohol use: Yes    Comment: 1 bottle of wine q wk  . Drug use: No  . Sexual activity: Yes    Birth control/protection: IUD    Comment: MIRENA (placed July 2012)  Lifestyle  . Physical activity:    Days per week: Not on file    Minutes per session: Not on file  . Stress: Not on file  Relationships  . Social connections:    Talks on phone: Not on file    Gets together: Not on file    Attends religious service: Not on file    Active member of club or organization: Not on file    Attends meetings of clubs or organizations: Not on file     Relationship status: Not on file  . Intimate partner violence:    Fear of current or ex partner: Not on file    Emotionally abused: Not on file    Physically abused: Not on file    Forced sexual activity: Not on file  Other Topics Concern  . Not on file  Social History Narrative  . Not on file    I appreciate the opportunity to take part in Saydie's care. Please do not hesitate to contact me with questions.  Sincerely,   R. Edgar Frisk, MD

## 2018-01-15 NOTE — Addendum Note (Signed)
Addended by: Lucrezia Starch I on: 01/15/2018 08:10 AM   Modules accepted: Orders

## 2018-01-23 ENCOUNTER — Ambulatory Visit (INDEPENDENT_AMBULATORY_CARE_PROVIDER_SITE_OTHER): Payer: Medicare Other | Admitting: *Deleted

## 2018-01-23 DIAGNOSIS — J309 Allergic rhinitis, unspecified: Secondary | ICD-10-CM

## 2018-01-29 ENCOUNTER — Ambulatory Visit (INDEPENDENT_AMBULATORY_CARE_PROVIDER_SITE_OTHER): Payer: Medicare Other | Admitting: *Deleted

## 2018-01-29 DIAGNOSIS — J309 Allergic rhinitis, unspecified: Secondary | ICD-10-CM | POA: Diagnosis not present

## 2018-01-30 DIAGNOSIS — F3112 Bipolar disorder, current episode manic without psychotic features, moderate: Secondary | ICD-10-CM | POA: Diagnosis not present

## 2018-01-31 DIAGNOSIS — N760 Acute vaginitis: Secondary | ICD-10-CM | POA: Diagnosis not present

## 2018-02-06 ENCOUNTER — Ambulatory Visit (INDEPENDENT_AMBULATORY_CARE_PROVIDER_SITE_OTHER): Payer: Medicare Other | Admitting: *Deleted

## 2018-02-06 DIAGNOSIS — J309 Allergic rhinitis, unspecified: Secondary | ICD-10-CM

## 2018-02-07 ENCOUNTER — Ambulatory Visit (INDEPENDENT_AMBULATORY_CARE_PROVIDER_SITE_OTHER): Payer: Medicare Other | Admitting: Physician Assistant

## 2018-02-07 ENCOUNTER — Encounter: Payer: Self-pay | Admitting: Physician Assistant

## 2018-02-07 DIAGNOSIS — F319 Bipolar disorder, unspecified: Secondary | ICD-10-CM | POA: Diagnosis not present

## 2018-02-07 DIAGNOSIS — F411 Generalized anxiety disorder: Secondary | ICD-10-CM

## 2018-02-07 MED ORDER — BUSPIRONE HCL 15 MG PO TABS: 15.0000 mg | ORAL_TABLET | Freq: Two times a day (BID) | ORAL | 3 refills | Status: DC

## 2018-02-07 MED ORDER — BUPROPION HCL ER (XL) 150 MG PO TB24: 150.0000 mg | ORAL_TABLET | Freq: Every day | ORAL | 1 refills | Status: DC

## 2018-02-07 NOTE — Progress Notes (Signed)
[facility]  Patient ID: Candace Browning,  MRN: [id number]  PCP: Bartholome Bill, MD  Date of Evaluation: 02/07/2018 Time spent:15 minutes  Chief Complaint:   HISTORY/CURRENT STATUS: HPI Here for 6 wk med check.   Patient denies increased energy with decreased need for sleep, no increased talkativeness, no racing thoughts, no impulsivity or risky behaviors, no increased spending, no increased libido, no grandiosity.  Patient denies loss of interest in usual activities and is able to enjoy things.  But does have decreased energy and motivation.  Appetite has not changed.  No extreme sadness, tearfulness, or feelings of hopelessness.  Denies any changes in concentration, making decisions or remembering things.  Denies suicidal or homicidal thoughts.  Anxiety is well controlled.  She tends to want to sleep more, approximately 11 hours a day.  She still works at [employer] and is not missing any work.  States she still feels "off." But not bad like she has in the past.  Still doesn't want to go back on a mood stabilizer or AAP. Has had problems in the past with side effects including tardive dyskinesia, galactorrhea and "just feeling off."  She still prefers not to go back on typical antipsychotic but is more willing to consider that now.  She would prefer to wait if at all possible  Individual Medical History/ Review of Systems: Changes? :No    Past medications for mental health diagnoses include: BuSpar, Xanax, Latuda, Depakote, Vistaril, Lamictal, Valium, Seroquel, Abilify, Risperdal, lithium, Equetro, Geodon, prazosin, doxazosin, Luvox, Zoloft, Vraylar, Wellbutrin  Allergies: Apple; Coconut oil; Gluten meal; Latex; Nsaids; Other; Peanut-containing drug products; Sulfa antibiotics; Hydrogen peroxide; Penicillins; and Wheat bran  Current Medications:  Current Outpatient Medications:  .  albuterol (PROVENTIL HFA;VENTOLIN HFA) 108 (90 Base) MCG/ACT inhaler, Inhale 2  puffs into the lungs every 6 (six) hours as needed., Disp: 18 g, Rfl: 1 .  ALPRAZolam (XANAX) 0.25 MG tablet, Take 0.25 mg by mouth 3 (three) times daily as needed for anxiety., Disp: , Rfl:  .  busPIRone (BUSPAR) 15 MG tablet, Take 1 tablet (15 mg total) by mouth 2 (two) times daily., Disp: 180 tablet, Rfl: 3 .  EPINEPHrine 0.3 mg/0.3 mL IJ SOAJ injection, Inject 0.3 mg into the muscle as needed., Disp: , Rfl:  .  levocetirizine (XYZAL) 5 MG tablet, TAKE ONE TABLET BY MOUTH EVERY EVENING, Disp: 30 tablet, Rfl: 3 .  loratadine (CLARITIN) 10 MG tablet, Take 10 mg by mouth daily., Disp: , Rfl:  .  valACYclovir (VALTREX) 500 MG tablet, Take 1 tablet (500 mg total) by mouth 2 (two) times daily as needed (outbreaks)., Disp: 30 tablet, Rfl: 0 .  buPROPion (WELLBUTRIN XL) 150 MG 24 hr tablet, Take 1 tablet (150 mg total) by mouth daily., Disp: 90 tablet, Rfl: 1 Medication Side Effects: none  Family Medical/ Social History: Changes? No  MENTAL HEALTH EXAM:  There were no vitals taken for this visit.There is no height or weight on file to calculate BMI.  General Appearance: Casual  Eye Contact:  Good  Speech:  Clear and Coherent  Volume:  Normal  Mood:  Euthymic  Affect:  Appropriate  Thought Process:  Goal Directed  Orientation:  Full (Time, Place, and Person)  Thought Content: Logical   Suicidal Thoughts:  No  Homicidal Thoughts:  No  Memory:  WNL  Judgement:  Good  Insight:  Good  Psychomotor Activity:  Normal  Concentration:  Concentration: Good  Recall:  Good  Fund of  Knowledge: Good  Language: Good  Assets:  Desire for Improvement  ADL's:  Intact  Cognition: WNL  Prognosis:  Good    DIAGNOSES:    ICD-10-CM   1. Bipolar I disorder ([place]) F31.9   2. Generalized anxiety disorder F41.1     Receiving Psychotherapy: No    RECOMMENDATIONS: We discussed options of starting another atypical antipsychotic.  I would prescribe Rexulti as our next choice.   Another option is to  increase the Wellbutrin, which is her choice at this point. Continue Xanax and BuSpar. She will call if she wants to go ahead and start the [place] before her next visit.  She will be going out of town for Christmas and prefers not to start a new medication until after her return, if it is needed. She has some form she needs [place] to fill out that she will mail back in since she forgot to bring it this morning. Return in 6 weeks or sooner as needed.   Donnal Moat, PA-C

## 2018-02-19 ENCOUNTER — Ambulatory Visit (INDEPENDENT_AMBULATORY_CARE_PROVIDER_SITE_OTHER): Payer: Medicare Other | Admitting: *Deleted

## 2018-02-19 DIAGNOSIS — J309 Allergic rhinitis, unspecified: Secondary | ICD-10-CM

## 2018-02-20 ENCOUNTER — Other Ambulatory Visit: Payer: Self-pay | Admitting: Physician Assistant

## 2018-02-20 ENCOUNTER — Other Ambulatory Visit: Payer: Self-pay

## 2018-02-20 MED ORDER — BREXPIPRAZOLE 1 MG PO TABS: 1.0000 mg | ORAL_TABLET | Freq: Every day | ORAL | 0 refills | Status: DC

## 2018-02-25 ENCOUNTER — Ambulatory Visit (INDEPENDENT_AMBULATORY_CARE_PROVIDER_SITE_OTHER): Payer: Medicare Other

## 2018-02-25 DIAGNOSIS — J309 Allergic rhinitis, unspecified: Secondary | ICD-10-CM

## 2018-02-26 DIAGNOSIS — J301 Allergic rhinitis due to pollen: Secondary | ICD-10-CM | POA: Diagnosis not present

## 2018-02-26 NOTE — Progress Notes (Signed)
VIALS EXP 02-27-19

## 2018-02-27 DIAGNOSIS — J3089 Other allergic rhinitis: Secondary | ICD-10-CM | POA: Diagnosis not present

## 2018-02-27 DIAGNOSIS — F3112 Bipolar disorder, current episode manic without psychotic features, moderate: Secondary | ICD-10-CM | POA: Diagnosis not present

## 2018-02-28 DIAGNOSIS — J3081 Allergic rhinitis due to animal (cat) (dog) hair and dander: Secondary | ICD-10-CM | POA: Diagnosis not present

## 2018-03-06 ENCOUNTER — Ambulatory Visit (INDEPENDENT_AMBULATORY_CARE_PROVIDER_SITE_OTHER): Payer: Medicare Other | Admitting: *Deleted

## 2018-03-06 DIAGNOSIS — J309 Allergic rhinitis, unspecified: Secondary | ICD-10-CM | POA: Diagnosis not present

## 2018-03-19 ENCOUNTER — Telehealth: Payer: Self-pay

## 2018-03-19 NOTE — Telephone Encounter (Signed)
Prior authorization approved for Rexulti 1mg  effective 02/17/2018 through 03/19/2098 Case #[id number]

## 2018-03-21 ENCOUNTER — Other Ambulatory Visit: Payer: Self-pay

## 2018-03-21 MED ORDER — BREXPIPRAZOLE 1 MG PO TABS: 1.0000 mg | ORAL_TABLET | Freq: Every day | ORAL | 0 refills | Status: DC

## 2018-03-27 DIAGNOSIS — F4310 Post-traumatic stress disorder, unspecified: Secondary | ICD-10-CM | POA: Diagnosis not present

## 2018-03-28 ENCOUNTER — Ambulatory Visit (INDEPENDENT_AMBULATORY_CARE_PROVIDER_SITE_OTHER): Payer: Medicare Other | Admitting: Physician Assistant

## 2018-03-28 ENCOUNTER — Encounter: Payer: Self-pay | Admitting: Physician Assistant

## 2018-03-28 ENCOUNTER — Encounter

## 2018-03-28 DIAGNOSIS — F319 Bipolar disorder, unspecified: Secondary | ICD-10-CM

## 2018-03-28 DIAGNOSIS — F411 Generalized anxiety disorder: Secondary | ICD-10-CM

## 2018-03-28 MED ORDER — BREXPIPRAZOLE 2 MG PO TABS: 2.0000 mg | ORAL_TABLET | Freq: Every day | ORAL | 1 refills | Status: DC

## 2018-03-28 MED ORDER — BUPROPION HCL ER (XL) 300 MG PO TB24: 300.0000 mg | ORAL_TABLET | Freq: Every day | ORAL | 1 refills | Status: DC

## 2018-03-28 NOTE — Progress Notes (Signed)
[facility]  Patient ID: GAILA ENGEBRETSEN,  MRN: [id number]  PCP: Bartholome Bill, MD  Date of Evaluation: 03/28/2018 Time spent:25 minutes  Chief Complaint:  Chief Complaint    Manic Behavior      HISTORY/CURRENT STATUS: HPI Not doing so well.  Started getting manic around Thanksgiving.  Was feeling 'off' and we decided to watch her, since she didn't want to restart an AAP.  She has increased energy, appetite, increased talkative, increased libido and spending more.  Not staying awake all hours of the night, but will wake up at times and stay awake for awhile.  If she makes an effort to go to bed, is able to do it.  Started Rexulti on 02/20/18.  Hasn't seen and improvement in mania.   Patient denies loss of interest in usual activities and is able to enjoy things.  Denies decreased energy or motivation.  Appetite has not changed.  No extreme sadness, tearfulness, or feelings of hopelessness.  Denies any changes in concentration, making decisions or remembering things.  Denies suicidal or homicidal thoughts.  Anxiety is pretty controlled.  Does have it a little bit but goes away quickly.   She and family went to [place] over the holidays and had a good time.   Work is good.  She just went back after her trip.   States she has had a little abnormal mouth movements but infrequent.  No tremors.  No gait disturbance.  No reports of falls.  Individual Medical History/ Review of Systems: Changes? :No    Past medications for mental health diagnoses include: BuSpar, Xanax, Latuda, Depakote, Vistaril, Lamictal, Valium, Seroquel, Abilify, Risperdal, lithium, Equetro, Geodon, prazosin, doxazosin, Luvox, Zoloft, Vraylar, Wellbutrin  Allergies: Apple; Coconut oil; Gluten meal; Latex; Nsaids; Other; Peanut-containing drug products; Sulfa antibiotics; Hydrogen peroxide; Penicillins; and Wheat bran  Current Medications:  Current Outpatient Medications:  .  albuterol (PROVENTIL  HFA;VENTOLIN HFA) 108 (90 Base) MCG/ACT inhaler, Inhale 2 puffs into the lungs every 6 (six) hours as needed., Disp: 18 g, Rfl: 1 .  ALPRAZolam (XANAX) 0.25 MG tablet, Take 0.25 mg by mouth 3 (three) times daily as needed for anxiety., Disp: , Rfl:  .  buPROPion (WELLBUTRIN XL) 300 MG 24 hr tablet, Take 1 tablet (300 mg total) by mouth daily., Disp: 90 tablet, Rfl: 1 .  busPIRone (BUSPAR) 15 MG tablet, Take 1 tablet (15 mg total) by mouth 2 (two) times daily., Disp: 180 tablet, Rfl: 3 .  cholecalciferol (VITAMIN D3) 25 MCG (1000 UT) tablet, Take 1,000 Units by mouth daily., Disp: , Rfl:  .  EPINEPHrine 0.3 mg/0.3 mL IJ SOAJ injection, Inject 0.3 mg into the muscle as needed., Disp: , Rfl:  .  LACTOBACILLUS ACID-PECTIN PO, Take by mouth., Disp: , Rfl:  .  levocetirizine (XYZAL) 5 MG tablet, TAKE ONE TABLET BY MOUTH EVERY EVENING, Disp: 30 tablet, Rfl: 3 .  loratadine (CLARITIN) 10 MG tablet, Take 10 mg by mouth daily., Disp: , Rfl:  .  valACYclovir (VALTREX) 500 MG tablet, Take 1 tablet (500 mg total) by mouth 2 (two) times daily as needed (outbreaks)., Disp: 30 tablet, Rfl: 0 .  Brexpiprazole (REXULTI) 2 MG TABS, Take 2 mg by mouth daily., Disp: 30 tablet, Rfl: 1 Medication Side Effects: breasts are getting bigger, no galactorea.  Gained 5#  Family Medical/ Social History: Changes? No  MENTAL HEALTH EXAM:  There were no vitals taken for this visit.There is no height or weight on file to calculate BMI.  General Appearance: Casual and Well Groomed  Eye Contact:  Good  Speech:  Pressured and Talkative  Volume:  Normal  Mood:  Euphoric  Affect:  Appropriate  Thought Process:  Goal Directed  Orientation:  Full (Time, Place, and Person)  Thought Content: Tangential   Suicidal Thoughts:  No  Homicidal Thoughts:  No  Memory:  WNL  Judgement:  Good  Insight:  Good  Psychomotor Activity:  Normal  Concentration:  Concentration: Good  Recall:  Good  Fund of Knowledge: Good  Language: Good   Assets:  Desire for Improvement  ADL's:  Intact  Cognition: WNL  Prognosis:  Good    DIAGNOSES:    ICD-10-CM   1. Bipolar I disorder ([place]) F31.9   2. Generalized anxiety disorder F41.1     Receiving Psychotherapy: No    RECOMMENDATIONS: Increase Rexulti 2mg  qd.  1 week supply of samples given.  She knows to double the 1 mg that she has at home until she is able to get the prescription filled. Continue Xanax 0.25 mg 3 times daily as needed Continue Wellbutrin XL 300 mg daily. Continue BuSpar 15 mg 1 twice daily. Return in 4 weeks.    Donnal Moat, PA-C

## 2018-04-01 ENCOUNTER — Ambulatory Visit (INDEPENDENT_AMBULATORY_CARE_PROVIDER_SITE_OTHER): Payer: Medicare Other

## 2018-04-01 DIAGNOSIS — J309 Allergic rhinitis, unspecified: Secondary | ICD-10-CM

## 2018-04-03 DIAGNOSIS — N763 Subacute and chronic vulvitis: Secondary | ICD-10-CM | POA: Diagnosis not present

## 2018-04-08 DIAGNOSIS — Z0289 Encounter for other administrative examinations: Secondary | ICD-10-CM

## 2018-04-12 ENCOUNTER — Ambulatory Visit (INDEPENDENT_AMBULATORY_CARE_PROVIDER_SITE_OTHER): Payer: Medicare Other

## 2018-04-12 ENCOUNTER — Ambulatory Visit (INDEPENDENT_AMBULATORY_CARE_PROVIDER_SITE_OTHER): Payer: Medicare Other | Admitting: Family Medicine

## 2018-04-12 ENCOUNTER — Encounter: Payer: Self-pay | Admitting: Family Medicine

## 2018-04-12 VITALS — BP 96/74 | HR 85 | Resp 16

## 2018-04-12 DIAGNOSIS — J309 Allergic rhinitis, unspecified: Secondary | ICD-10-CM | POA: Diagnosis not present

## 2018-04-12 DIAGNOSIS — J4520 Mild intermittent asthma, uncomplicated: Secondary | ICD-10-CM

## 2018-04-12 DIAGNOSIS — J3089 Other allergic rhinitis: Secondary | ICD-10-CM

## 2018-04-12 DIAGNOSIS — J0100 Acute maxillary sinusitis, unspecified: Secondary | ICD-10-CM

## 2018-04-12 MED ORDER — TRIAMCINOLONE ACETONIDE 55 MCG/ACT NA AERO: 2.0000 | INHALATION_SPRAY | Freq: Every day | NASAL | 5 refills | Status: DC

## 2018-04-12 NOTE — Progress Notes (Signed)
Candace Browning Dept: [id number]  FOLLOW UP NOTE  Patient ID: Candace Browning, female    DOB: 24-Nov-1980  Age: 38 y.o. MRN: [id number] Date of Office Visit: 04/12/2018  Assessment  Chief Complaint: Allergic Rhinitis  and Asthma  HPI Candace Browning is a 38 year old female who presents to the clinic for a sick visit. She was last seen in the clinic on 01/14/2018 by Dr. Verlin Fester for evaluation of asthma, allergic rhinitis, and allergic conjunctivitis. At today's visit, she reports nasal congestion, sneezing, and sinus pressure around her eyes that began on Wednesday night. She denies fever and sick contacts. She reports asthma as well controlled with no shortness of breath, wheeze, or cough. She continues albuterol as needed which averages about 1 time a month. Allergic rhinitis is reported as well controlled, until 2 days ago, with Claritin as needed and occasional nasal saline rinse. She reports allergen immunotherapy has significantly decreased her symptoms of rhinitis. She continues to avoid all nuts and wheat. Her medications are listed in the chart.   Drug Allergies:  Allergies  Allergen Reactions  . Apple Anaphylaxis  . Coconut Oil   . Gluten Meal     All breads  . Latex   . Nsaids Other (See Comments)    Thin basement membrane disease, advised to avoid NSAIDS  . Other     PT HAD FOOD ALLERGIES:APPLES, WHEAT, AND all nuts  . Peanut-Containing Drug Products   . Sulfa Antibiotics   . Hydrogen Peroxide Itching and Rash  . Penicillins Rash    Has patient had a PCN reaction causing immediate rash, facial/tongue/throat swelling, SOB or lightheadedness with hypotension: yes  Has patient had a PCN reaction causing severe rash involving mucus membranes or skin necrosis: no Has patient had a PCN reaction that required hospitalization: no  Has patient had a PCN reaction occurring within the last 10 years: no If all of the above answers are "NO", then may  proceed with Cephalosporin use.   . Wheat Bran Rash    Physical Exam: BP 96/74 (BP Location: Left Arm, Patient Position: Sitting, Cuff Size: Normal)   Pulse 85   Resp 16   SpO2 99%    Physical Exam Vitals signs reviewed.  Constitutional:      Appearance: Normal appearance.  HENT:     Head: Normocephalic and atraumatic.     Right Ear: Tympanic membrane normal.     Left Ear: Tympanic membrane normal.     Nose:     Comments: Bilateral nares edematous and pale with clear nasal drainage noted.  Pharynx slightly erythematous with no exudate noted.  Ears normal.  Eyes normal. Eyes:     Conjunctiva/sclera: Conjunctivae normal.  Neck:     Musculoskeletal: Normal range of motion and neck supple.  Cardiovascular:     Rate and Rhythm: Normal rate and regular rhythm.     Heart sounds: Normal heart sounds. No murmur.  Pulmonary:     Effort: Pulmonary effort is normal.     Breath sounds: Normal breath sounds.     Comments: Lungs clear to auscultation Musculoskeletal: Normal range of motion.  Skin:    General: Skin is warm and dry.  Neurological:     Mental Status: She is alert and oriented to person, place, and time.  Psychiatric:        Mood and Affect: Mood normal.        Behavior: Behavior normal.  Thought Content: Thought content normal.        Judgment: Judgment normal.     Diagnostics: FVC 3.43, FEV1 2.77.  Predicted FVC 3.37, predicted FEV1 2.08.  Spirometry is within the normal range.  Assessment and Plan: 1. Acute maxillary sinusitis, recurrence not specified   2. Allergic rhinitis   3. Mild intermittent asthma without complication     Meds ordered this encounter  Medications  . triamcinolone (NASACORT) 55 MCG/ACT AERO nasal inhaler    Sig: Place 2 sprays into the nose daily for 30 days.    Dispense:  1 Bottle    Refill:  5    Patient Instructions  Acute sinusitis Prednisone 10 mg tablets. Take 2 tablets twice a day for 4 days then take 1 tablet on the  5th day, then stop Continue saline nasal rinses. Use this before medicated nasal sprays Nasocort 1-2 sprays in each nostril once a day for a stuffy nose. This will help to relieve ear pressure also Mucinex [phone] mg twice a day as needed. This will thin mucus and help with drainage  Asthma Continue albuterol as needed  Allergic rhinitis Continue allergen avoidance measures Continue allergen immunotherapy  Call the clinic if this treatment plan is not working well for you  Follow up in 3 months or sooner if needed   Return in about 3 months (around 07/12/2018), or if symptoms worsen or fail to improve.    Thank you for the opportunity to care for this patient.  Please do not hesitate to contact me with questions.  Gareth Morgan, FNP [facility]

## 2018-04-12 NOTE — Patient Instructions (Addendum)
Acute sinusitis Prednisone 10 mg tablets. Take 2 tablets twice a day for 4 days then take 1 tablet on the 5th day, then stop Continue saline nasal rinses. Use this before medicated nasal sprays Nasocort 1-2 sprays in each nostril once a day for a stuffy nose. This will help to relieve ear pressure also Mucinex [phone] mg twice a day as needed. This will thin mucus and help with drainage  Asthma Continue albuterol as needed  Allergic rhinitis Continue allergen avoidance measures Continue allergen immunotherapy  Call the clinic if this treatment plan is not working well for you  Follow up in 3 months or sooner if needed

## 2018-04-17 ENCOUNTER — Ambulatory Visit (INDEPENDENT_AMBULATORY_CARE_PROVIDER_SITE_OTHER): Payer: Medicare Other | Admitting: *Deleted

## 2018-04-17 DIAGNOSIS — J309 Allergic rhinitis, unspecified: Secondary | ICD-10-CM | POA: Diagnosis not present

## 2018-04-18 DIAGNOSIS — F4310 Post-traumatic stress disorder, unspecified: Secondary | ICD-10-CM | POA: Diagnosis not present

## 2018-04-24 ENCOUNTER — Ambulatory Visit (INDEPENDENT_AMBULATORY_CARE_PROVIDER_SITE_OTHER): Payer: Medicare Other

## 2018-04-24 DIAGNOSIS — J309 Allergic rhinitis, unspecified: Secondary | ICD-10-CM | POA: Diagnosis not present

## 2018-04-24 NOTE — Progress Notes (Signed)
Patient did take Claritin last night. She informed me that her neighbor mowed his lawn this morning before she came here. The left upper arm developed redness, heat and one large hive with a patch of smaller ones spread downward. Patient advised to use ice pack and to take extra antihistamine for a couple of days and to take pictures if area worsens. Patient did acknowledge understanding of this plan.

## 2018-04-29 ENCOUNTER — Encounter: Payer: Self-pay | Admitting: Physician Assistant

## 2018-04-29 ENCOUNTER — Ambulatory Visit (INDEPENDENT_AMBULATORY_CARE_PROVIDER_SITE_OTHER): Payer: Medicare Other | Admitting: Physician Assistant

## 2018-04-29 DIAGNOSIS — F3112 Bipolar disorder, current episode manic without psychotic features, moderate: Secondary | ICD-10-CM

## 2018-04-29 DIAGNOSIS — F319 Bipolar disorder, unspecified: Secondary | ICD-10-CM

## 2018-04-29 DIAGNOSIS — G4700 Insomnia, unspecified: Secondary | ICD-10-CM

## 2018-04-29 MED ORDER — ALPRAZOLAM 0.25 MG PO TABS: 0.2500 mg | ORAL_TABLET | Freq: Three times a day (TID) | ORAL | 5 refills | Status: DC | PRN

## 2018-04-29 MED ORDER — OLANZAPINE 5 MG PO TABS: ORAL_TABLET | ORAL | 1 refills | Status: DC

## 2018-04-29 MED ORDER — OLANZAPINE 10 MG PO TABS: 10.0000 mg | ORAL_TABLET | Freq: Every day | ORAL | 1 refills | Status: DC

## 2018-04-29 NOTE — Patient Instructions (Signed)
Stop the Rexulti.

## 2018-04-29 NOTE — Progress Notes (Signed)
[facility]  Patient ID: Candace Browning,  MRN: [id number]  PCP: Bartholome Bill, MD  Date of Evaluation: 04/29/18 Time spent:25 minutes  Chief Complaint:  Chief Complaint    Follow-up; Manic Behavior      HISTORY/CURRENT STATUS: HPI For routine med check. But 'I'm manic.'  Has been getting more and more manic on the Rexulti, even though it was increased at last visit.  Past 4 nights has only been sleeping 6 hours or less. "I feel like I'm not as manic as fast it usually happens, but I can tell things are getting worse because I've been thinking about taking money out of savings to go to the mall.  I have been spending more, just not going crazy with the money.  Yet.   I know I'm about to get really bad. Have eaten 10 boxes of [employer] cookies in past 3 weeks, and I don't usually do that." No risky behavior.  More talkative.  Husband has said that too.  And so has her therapist.  Positive grandisosity.  Has a lot of increased energy.  Feels jittery on the Rexulti.  When focuses on it, can stop it.  Mostly bounces her legs but rocks some too.   Patient denies loss of interest in usual activities and is able to enjoy things.  Denies decreased energy or motivation.  Appetite has not changed.  No extreme sadness, tearfulness, or feelings of hopelessness.  Denies any changes in concentration, making decisions or remembering things.  Denies suicidal or homicidal thoughts.  Denies dizziness, syncope, seizures, numbness, tingling, tremor, tics, unsteady gait, slurred speech, confusion.   Denies muscle or joint pain, stiffness, or dystonia.  Individual Medical History/ Review of Systems: Changes? :No    Past medications for mental health diagnoses include: BuSpar, Xanax, Latuda, Depakote, Vistaril, Lamictal, Valium, Seroquel, Abilify, Risperdal, lithium, Equetro, Geodon, prazosin,doxazosin, Luvox, Zoloft, Vraylar, Wellbutrin, Rexulti  Allergies: Apple; Coconut oil;  Gluten meal; Latex; Nsaids; Other; Peanut-containing drug products; Sulfa antibiotics; Hydrogen peroxide; Penicillins; and Wheat bran   Current Medications:  Current Outpatient Medications:  .  albuterol (PROVENTIL HFA;VENTOLIN HFA) 108 (90 Base) MCG/ACT inhaler, Inhale 2 puffs into the lungs every 6 (six) hours as needed., Disp: 18 g, Rfl: 1 .  ALPRAZolam (XANAX) 0.25 MG tablet, Take 1 tablet (0.25 mg total) by mouth 3 (three) times daily as needed for anxiety., Disp: 90 tablet, Rfl: 5 .  buPROPion (WELLBUTRIN XL) 300 MG 24 hr tablet, Take 1 tablet (300 mg total) by mouth daily., Disp: 90 tablet, Rfl: 1 .  busPIRone (BUSPAR) 15 MG tablet, Take 1 tablet (15 mg total) by mouth 2 (two) times daily., Disp: 180 tablet, Rfl: 3 .  cholecalciferol (VITAMIN D3) 25 MCG (1000 UT) tablet, Take 1,000 Units by mouth daily., Disp: , Rfl:  .  EPINEPHrine 0.3 mg/0.3 mL IJ SOAJ injection, Inject 0.3 mg into the muscle as needed., Disp: , Rfl:  .  LACTOBACILLUS ACID-PECTIN PO, Take by mouth., Disp: , Rfl:  .  loratadine (CLARITIN) 10 MG tablet, Take 10 mg by mouth daily., Disp: , Rfl:  .  triamcinolone (NASACORT) 55 MCG/ACT AERO nasal inhaler, Place 2 sprays into the nose daily for 30 days., Disp: 1 Bottle, Rfl: 5 .  valACYclovir (VALTREX) 500 MG tablet, Take 1 tablet (500 mg total) by mouth 2 (two) times daily as needed (outbreaks)., Disp: 30 tablet, Rfl: 0 .  levocetirizine (XYZAL) 5 MG tablet, TAKE ONE TABLET BY MOUTH EVERY EVENING (Patient not  taking: No sig reported), Disp: 30 tablet, Rfl: 3 .  OLANZapine (ZYPREXA) 10 MG tablet, Take 1 tablet (10 mg total) by mouth at bedtime., Disp: 30 tablet, Rfl: 1 Medication Side Effects: see above  Family Medical/ Social History: Changes? No  MENTAL HEALTH EXAM:  There were no vitals taken for this visit.There is no height or weight on file to calculate BMI.  General Appearance: Casual and Well Groomed  Eye Contact:  Good  Speech:  Clear and Coherent and Talkative   Volume:  Normal  Mood:  Euthymic  Affect:  Appropriate  Thought Process:  Goal Directed  Orientation:  Full (Time, Place, and Person)  Thought Content: Logical   Suicidal Thoughts:  No  Homicidal Thoughts:  No  Memory:  WNL  Judgement:  Fair  Insight:  Good  Psychomotor Activity:  shakes both legs, but able to stop when pays attention to it.   Concentration:  Concentration: Good and Attention Span: Good  Recall:  Good  Fund of Knowledge: Good  Language: Good  Assets:  Desire for Improvement  ADL's:  Intact  Cognition: WNL  Prognosis:  Good    DIAGNOSES:    ICD-10-CM   1. Bipolar 1 disorder with moderate mania ([place]) F31.12   2. Bipolar I disorder ([place]) F31.9   3. Insomnia, unspecified type G47.00     Receiving Psychotherapy: No    RECOMMENDATIONS: I spent 25 minutes with her and 50% of that time was in counseling.  We discussed her diagnosis and other treatment options.  She has been on several different antipsychotics but has never tried Zyprexa.   Start Zyprexa 10 mg nightly.  She will call in a week if she is not noticing any improvement in the mania, especially with sleep, grandiosity and increased spending.  We will increase Zyprexa to 15 mg over the phone. Stop Rexulti. Continue Xanax 0.25 mg 3 times daily as needed. Continue Wellbutrin XL 300 mg daily. Continue BuSpar 15 mg p.o. twice daily. Will order labs at next visit. Return in 4 weeks.    Donnal Moat, PA-C

## 2018-04-30 ENCOUNTER — Ambulatory Visit (INDEPENDENT_AMBULATORY_CARE_PROVIDER_SITE_OTHER): Payer: Medicare Other | Admitting: *Deleted

## 2018-04-30 DIAGNOSIS — J309 Allergic rhinitis, unspecified: Secondary | ICD-10-CM | POA: Diagnosis not present

## 2018-05-01 ENCOUNTER — Encounter: Payer: Self-pay | Admitting: *Deleted

## 2018-05-02 ENCOUNTER — Other Ambulatory Visit (HOSPITAL_COMMUNITY): Payer: Self-pay | Admitting: Oral and Maxillofacial Surgery

## 2018-05-02 ENCOUNTER — Other Ambulatory Visit: Payer: Self-pay | Admitting: Oral and Maxillofacial Surgery

## 2018-05-02 DIAGNOSIS — D490 Neoplasm of unspecified behavior of digestive system: Secondary | ICD-10-CM

## 2018-05-10 ENCOUNTER — Ambulatory Visit (HOSPITAL_COMMUNITY)
Admission: RE | Admit: 2018-05-10 | Discharge: 2018-05-10 | Disposition: A | Discharge status: Home / Self Care | Payer: Medicare Other | Source: Ambulatory Visit | Attending: Oral and Maxillofacial Surgery | Admitting: Oral and Maxillofacial Surgery

## 2018-05-10 ENCOUNTER — Ambulatory Visit (INDEPENDENT_AMBULATORY_CARE_PROVIDER_SITE_OTHER): Payer: Medicare Other | Admitting: *Deleted

## 2018-05-10 DIAGNOSIS — D490 Neoplasm of unspecified behavior of digestive system: Secondary | ICD-10-CM | POA: Diagnosis not present

## 2018-05-10 DIAGNOSIS — J309 Allergic rhinitis, unspecified: Secondary | ICD-10-CM

## 2018-05-10 DIAGNOSIS — K1379 Other lesions of oral mucosa: Secondary | ICD-10-CM | POA: Diagnosis not present

## 2018-05-10 DIAGNOSIS — K089 Disorder of teeth and supporting structures, unspecified: Secondary | ICD-10-CM | POA: Diagnosis not present

## 2018-05-10 MED ORDER — IOHEXOL 300 MG/ML  SOLN
75.0000 mL | Freq: Once | INTRAMUSCULAR | Status: AC | PRN
  Administered 2018-05-10: 75 mL via INTRAVENOUS

## 2018-05-13 DIAGNOSIS — B009 Herpesviral infection, unspecified: Secondary | ICD-10-CM | POA: Diagnosis not present

## 2018-05-13 DIAGNOSIS — N760 Acute vaginitis: Secondary | ICD-10-CM | POA: Diagnosis not present

## 2018-05-15 ENCOUNTER — Telehealth: Payer: Self-pay | Admitting: Physician Assistant

## 2018-05-15 NOTE — Telephone Encounter (Signed)
New medication that she is taking is making her stutter a lot. Would like to know if you wants her to try to come in before her scheduled appt.

## 2018-05-17 ENCOUNTER — Ambulatory Visit (INDEPENDENT_AMBULATORY_CARE_PROVIDER_SITE_OTHER): Payer: Medicare Other | Admitting: *Deleted

## 2018-05-17 DIAGNOSIS — J309 Allergic rhinitis, unspecified: Secondary | ICD-10-CM

## 2018-05-17 DIAGNOSIS — F4310 Post-traumatic stress disorder, unspecified: Secondary | ICD-10-CM | POA: Diagnosis not present

## 2018-05-22 ENCOUNTER — Ambulatory Visit (INDEPENDENT_AMBULATORY_CARE_PROVIDER_SITE_OTHER): Payer: Medicare Other | Admitting: *Deleted

## 2018-05-22 DIAGNOSIS — J309 Allergic rhinitis, unspecified: Secondary | ICD-10-CM

## 2018-05-29 ENCOUNTER — Ambulatory Visit (INDEPENDENT_AMBULATORY_CARE_PROVIDER_SITE_OTHER): Payer: Medicare Other | Admitting: Physician Assistant

## 2018-05-29 ENCOUNTER — Encounter: Payer: Self-pay | Admitting: Physician Assistant

## 2018-05-29 ENCOUNTER — Other Ambulatory Visit: Payer: Self-pay

## 2018-05-29 DIAGNOSIS — G4700 Insomnia, unspecified: Secondary | ICD-10-CM

## 2018-05-29 DIAGNOSIS — F319 Bipolar disorder, unspecified: Secondary | ICD-10-CM

## 2018-05-29 DIAGNOSIS — F411 Generalized anxiety disorder: Secondary | ICD-10-CM

## 2018-05-29 MED ORDER — CARIPRAZINE HCL 1.5 MG PO CAPS: 1.5000 mg | ORAL_CAPSULE | Freq: Every day | ORAL | 1 refills | Status: DC

## 2018-05-29 MED ORDER — BUSPIRONE HCL 15 MG PO TABS: 15.0000 mg | ORAL_TABLET | Freq: Two times a day (BID) | ORAL | 3 refills | Status: DC

## 2018-05-29 NOTE — Progress Notes (Signed)
[facility]  Patient ID: Candace Browning,  MRN: [id number]  PCP: Bartholome Bill, MD  Date of Evaluation: 05/29/2018 Time spent:25 minutes  Chief Complaint:  Chief Complaint    Follow-up      HISTORY/CURRENT STATUS: HPI For routine med check.   At [facility] on 04/29/18, we added Zyprexa.  It caused stuttering and confusion.  She took it for a few weeks, but it got worse and worse.  She weaned off it and after about 2 days she had no more confusion and the stuttering went away.   Mood has been up and down.  Since LOV, she's had lots of 'up' days. For about 2 wks, she was up but then when stopped the Zyprexa, got depressed with staying in bed more, no SI/HI, was really tired, stopped eating as well, (had increased appetite on the Zyprexa which was good for her.) She forced herself to work. That lasted about a week or so, and the past few days she's been 'up' again.  Still sleeps a lot though.  Not spending a lot of money.  But did before the Zyprexa. Was grandiose before the Zyprexa.  Enjoys things more now, but didn't a week ago, also isolating herself even now, after she's feeling more 'up' now.   Anxiety is still there, but worse at times than others.  Xanax still works but takes [employer].  If she takes 2, it makes her too drowsy.   Denies muscle or joint pain, stiffness, or dystonia.  Denies dizziness, syncope, seizures, numbness, tingling, tremor, tics, unsteady gait, slurred speech, confusion.   Individual Medical History/ Review of Systems: Changes? :No    Past medications for mental health diagnoses include: BuSpar, Xanax, Latuda, Depakote, Vistaril, Lamictal, Valium, Seroquel, Abilify, Risperdal, lithium, Equetro, Geodon, prazosin,doxazosin, Luvox, Zoloft, Vraylar, Wellbutrin, Rexulti, Zyprexa caused confusion and stuttering   Allergies: Apple; Coconut oil; Gluten meal; Latex; Nsaids; Other; Peanut-containing drug products; Sulfa antibiotics; Hydrogen peroxide;  Penicillins; and Wheat bran  Current Medications:  Current Outpatient Medications:  .  albuterol (PROVENTIL HFA;VENTOLIN HFA) 108 (90 Base) MCG/ACT inhaler, Inhale 2 puffs into the lungs every 6 (six) hours as needed., Disp: 18 g, Rfl: 1 .  ALPRAZolam (XANAX) 0.25 MG tablet, Take 1 tablet (0.25 mg total) by mouth 3 (three) times daily as needed for anxiety., Disp: 90 tablet, Rfl: 5 .  buPROPion (WELLBUTRIN XL) 300 MG 24 hr tablet, Take 1 tablet (300 mg total) by mouth daily., Disp: 90 tablet, Rfl: 1 .  busPIRone (BUSPAR) 15 MG tablet, Take 1 tablet (15 mg total) by mouth 2 (two) times daily., Disp: 180 tablet, Rfl: 3 .  cholecalciferol (VITAMIN D3) 25 MCG (1000 UT) tablet, Take 1,000 Units by mouth daily., Disp: , Rfl:  .  EPINEPHrine 0.3 mg/0.3 mL IJ SOAJ injection, Inject 0.3 mg into the muscle as needed., Disp: , Rfl:  .  fexofenadine (ALLEGRA) 180 MG tablet, Take 180 mg by mouth daily., Disp: , Rfl:  .  valACYclovir (VALTREX) 500 MG tablet, Take 1 tablet (500 mg total) by mouth 2 (two) times daily as needed (outbreaks)., Disp: 30 tablet, Rfl: 0 .  cariprazine (VRAYLAR) capsule, Take 1 capsule (1.5 mg total) by mouth daily., Disp: 30 capsule, Rfl: 1 .  LACTOBACILLUS ACID-PECTIN PO, Take by mouth., Disp: , Rfl:  .  levocetirizine (XYZAL) 5 MG tablet, TAKE ONE TABLET BY MOUTH EVERY EVENING (Patient not taking: No sig reported), Disp: 30 tablet, Rfl: 3 .  loratadine (CLARITIN) 10 MG tablet,  Take 10 mg by mouth daily., Disp: , Rfl:  .  OLANZapine (ZYPREXA) 10 MG tablet, Take 1 tablet (10 mg total) by mouth at bedtime. (Patient not taking: Reported on 05/29/2018), Disp: 30 tablet, Rfl: 1 .  triamcinolone (NASACORT) 55 MCG/ACT AERO nasal inhaler, Place 2 sprays into the nose daily for 30 days., Disp: 1 Bottle, Rfl: 5 Medication Side Effects: confusion and stuttering with Zyprexa  Family Medical/ Social History: Changes? No  MENTAL HEALTH EXAM:  There were no vitals taken for this visit.There is  no height or weight on file to calculate BMI.  General Appearance: Casual and Well Groomed  Eye Contact:  Good  Speech:  Clear and Coherent and Talkative  Volume:  Normal  Mood:  "up" for her but not euphoric  Affect:  Appropriate  Thought Process:  Goal Directed  Orientation:  Full (Time, Place, and Person)  Thought Content: Logical   Suicidal Thoughts:  No  Homicidal Thoughts:  No  Memory:  WNL  Judgement:  Good  Insight:  Good  Psychomotor Activity:  Normal  Concentration:  Concentration: Good  Recall:  Good  Fund of Knowledge: Good  Language: Good  Assets:  Desire for Improvement  ADL's:  Intact  Cognition: WNL  Prognosis:  Good    DIAGNOSES:    ICD-10-CM   1. Bipolar I disorder ([place]) F31.9   2. Insomnia, unspecified type G47.00   3. Generalized anxiety disorder F41.1     Receiving Psychotherapy: No    RECOMMENDATIONS: I spent 25 minutes with her and 50% of that time was in counseling concerning her diagnosis and treatment options.  I reviewed her paper chart and the medications we have tried and reaction she had to them. Retry Vraylar 1.5 mg (3 wks samples) as well as prescription being sent in.  Looking back at the reason she stopped it back in March 2018 was because it made her "feel weird.  She did not have suicidal thoughts but felt like she might get to a point where she wanted to hurt herself.  I do not know if it was the medication or not.  I would like to try it again." Continue Xanax 0.25 mg 3 times daily as needed.  She can even take 1-1/2 pills when needed.  That may help with the anxiety but not cause so much drowsiness.  PDMP was reviewed. Continue Wellbutrin XL 300 mg daily. Continue BuSpar 15 mg twice daily. Continue vitamin D. Return in 4 weeks or sooner as needed.  Donnal Moat, PA-C   This record has been created using [employer].  Chart creation errors have been sought, but may not always have been located and corrected. Such creation errors  do not reflect on the standard of medical care.

## 2018-05-31 ENCOUNTER — Ambulatory Visit (INDEPENDENT_AMBULATORY_CARE_PROVIDER_SITE_OTHER): Payer: Medicare Other

## 2018-05-31 DIAGNOSIS — J309 Allergic rhinitis, unspecified: Secondary | ICD-10-CM | POA: Diagnosis not present

## 2018-06-20 ENCOUNTER — Ambulatory Visit (INDEPENDENT_AMBULATORY_CARE_PROVIDER_SITE_OTHER): Payer: Medicare Other

## 2018-06-20 DIAGNOSIS — J309 Allergic rhinitis, unspecified: Secondary | ICD-10-CM

## 2018-06-26 DIAGNOSIS — J301 Allergic rhinitis due to pollen: Secondary | ICD-10-CM

## 2018-06-26 NOTE — Progress Notes (Signed)
EXP 06/26/19 

## 2018-06-27 ENCOUNTER — Other Ambulatory Visit: Payer: Self-pay

## 2018-06-27 ENCOUNTER — Ambulatory Visit (INDEPENDENT_AMBULATORY_CARE_PROVIDER_SITE_OTHER): Payer: Medicare Other | Admitting: Physician Assistant

## 2018-06-27 ENCOUNTER — Ambulatory Visit (INDEPENDENT_AMBULATORY_CARE_PROVIDER_SITE_OTHER): Payer: Medicare Other

## 2018-06-27 ENCOUNTER — Encounter: Payer: Self-pay | Admitting: Physician Assistant

## 2018-06-27 DIAGNOSIS — G4700 Insomnia, unspecified: Secondary | ICD-10-CM | POA: Diagnosis not present

## 2018-06-27 DIAGNOSIS — J309 Allergic rhinitis, unspecified: Secondary | ICD-10-CM | POA: Diagnosis not present

## 2018-06-27 DIAGNOSIS — F319 Bipolar disorder, unspecified: Secondary | ICD-10-CM

## 2018-06-27 DIAGNOSIS — F411 Generalized anxiety disorder: Secondary | ICD-10-CM

## 2018-06-27 MED ORDER — CARIPRAZINE HCL 1.5 MG PO CAPS: 1.5000 mg | ORAL_CAPSULE | Freq: Every day | ORAL | 5 refills | Status: DC

## 2018-06-27 NOTE — Progress Notes (Signed)
[facility]  Patient ID: Candace Browning,  MRN: [id number]  PCP: Bartholome Bill, MD  Date of Evaluation: 06/27/2018 Time spent:15 minutes  Chief Complaint:  Chief Complaint    Follow-up     Virtual Visit via Telephone Note  I connected with Candace Browning on 06/27/18 at 10:00 AM EDT by telephone and verified that I am speaking with the correct person using two identifiers.   I discussed the limitations, risks, security and privacy concerns of performing an evaluation and management service by telephone and the availability of in person appointments. I also discussed with the patient that there may be a patient responsible charge related to this service. The patient expressed understanding and agreed to proceed.    HISTORY/CURRENT STATUS: HPI for 1 month med check.  At the last visit, we added Vraylar.  She has done extremely well.  She is feeling much better.    She denies anhedonia, her energy and motivation are good.  She is isolating now only because of the coronavirus pandemic and shelter in place.  States she would like to go somewhere if she was allowed to.  Does not cry easily.  Patient denies increased energy with decreased need for sleep, no increased talkativeness, no racing thoughts, no impulsivity or risky behaviors, no increased spending, no increased libido, no grandiosity.  She still not sleeping very well.  She can go to sleep okay but wakes up some during the night and might not be able to go back to sleep for 1-1/2 hours sometimes.  States she is okay with that at this point because she is able to sleep late or take naps during the day if she wants to.  She likes the flexibility in a way.  Denies muscle or joint pain, stiffness, or dystonia.  Denies dizziness, syncope, seizures, numbness, tingling, tremor, tics, unsteady gait, slurred speech, confusion.   Individual Medical History/ Review of Systems: Changes? :No    Past medications for  mental health diagnoses include: BuSpar, Xanax, Latuda, Depakote, Vistaril, Lamictal, Valium, Seroquel, Abilify, Risperdal, lithium, Equetro, Geodon, prazosin,doxazosin, Luvox, Zoloft, Vraylar, Wellbutrin, Rexulti, Zyprexa caused confusion and stuttering   Allergies: Apple; Coconut oil; Gluten meal; Latex; Nsaids; Other; Peanut-containing drug products; Sulfa antibiotics; Hydrogen peroxide; Penicillins; and Wheat bran  Current Medications:  Current Outpatient Medications:  .  albuterol (PROVENTIL HFA;VENTOLIN HFA) 108 (90 Base) MCG/ACT inhaler, Inhale 2 puffs into the lungs every 6 (six) hours as needed., Disp: 18 g, Rfl: 1 .  ALPRAZolam (XANAX) 0.25 MG tablet, Take 1 tablet (0.25 mg total) by mouth 3 (three) times daily as needed for anxiety., Disp: 90 tablet, Rfl: 5 .  buPROPion (WELLBUTRIN XL) 300 MG 24 hr tablet, Take 1 tablet (300 mg total) by mouth daily., Disp: 90 tablet, Rfl: 1 .  busPIRone (BUSPAR) 15 MG tablet, Take 1 tablet (15 mg total) by mouth 2 (two) times daily., Disp: 180 tablet, Rfl: 3 .  cariprazine (VRAYLAR) capsule, Take 1 capsule (1.5 mg total) by mouth daily., Disp: 30 capsule, Rfl: 5 .  cholecalciferol (VITAMIN D3) 25 MCG (1000 UT) tablet, Take 1,000 Units by mouth daily., Disp: , Rfl:  .  EPINEPHrine 0.3 mg/0.3 mL IJ SOAJ injection, Inject 0.3 mg into the muscle as needed., Disp: , Rfl:  .  fexofenadine (ALLEGRA) 180 MG tablet, Take 180 mg by mouth daily., Disp: , Rfl:  .  Triamcinolone Acetonide (NASACORT AQ NA), Place into the nose., Disp: , Rfl:  .  valACYclovir (VALTREX) 500  MG tablet, Take 1 tablet (500 mg total) by mouth 2 (two) times daily as needed (outbreaks)., Disp: 30 tablet, Rfl: 0 .  LACTOBACILLUS ACID-PECTIN PO, Take by mouth., Disp: , Rfl:  .  triamcinolone (NASACORT) 55 MCG/ACT AERO nasal inhaler, Place 2 sprays into the nose daily for 30 days., Disp: 1 Bottle, Rfl: 5 Medication Side Effects: none  Family Medical/ Social History: Changes? Yes not working  now ([employer]) d/t coronavirus pandemic and shelter in place.   MENTAL HEALTH EXAM:  There were no vitals taken for this visit.There is no height or weight on file to calculate BMI.  General Appearance: Phone visit, unable to assess  Eye Contact:  unable to assess  Speech:  Clear and Coherent  Volume:  Normal  Mood:  Euthymic  Affect:  unable to assess  Thought Process:  Goal Directed  Orientation:  Full (Time, Place, and Person)  Thought Content: Logical   Suicidal Thoughts:  No  Homicidal Thoughts:  No  Memory:  WNL  Judgement:  Good  Insight:  Good  Psychomotor Activity:  unable to assess  Concentration:  Concentration: Good  Recall:  Good  Fund of Knowledge: Good  Language: Good  Assets:  Desire for Improvement  ADL's:  Intact  Cognition: WNL  Prognosis:  Good    DIAGNOSES:    ICD-10-CM   1. Bipolar I disorder ([place]) F31.9   2. Insomnia, unspecified type G47.00   3. Generalized anxiety disorder F41.1     Receiving Psychotherapy: No    RECOMMENDATIONS: I am glad to see her doing so well! Continue Xanax 0.25 mg 3 times daily as needed.  PDMP was reviewed. Continue Wellbutrin XL 300 mg p.o. daily. Continue BuSpar 15 mg twice daily. Continue Vraylar 1.5 mg daily. Continue vitamin D. Sleep hygiene was discussed. Return in 2 months.  Donnal Moat, PA-C   This record has been created using [employer].  Chart creation errors have been sought, but may not always have been located and corrected. Such creation errors do not reflect on the standard of medical care.

## 2018-07-02 DIAGNOSIS — J3089 Other allergic rhinitis: Secondary | ICD-10-CM | POA: Diagnosis not present

## 2018-07-03 DIAGNOSIS — J3081 Allergic rhinitis due to animal (cat) (dog) hair and dander: Secondary | ICD-10-CM | POA: Diagnosis not present

## 2018-07-04 ENCOUNTER — Ambulatory Visit (INDEPENDENT_AMBULATORY_CARE_PROVIDER_SITE_OTHER): Payer: Medicare Other | Admitting: *Deleted

## 2018-07-04 DIAGNOSIS — J309 Allergic rhinitis, unspecified: Secondary | ICD-10-CM | POA: Diagnosis not present

## 2018-07-05 DIAGNOSIS — F4310 Post-traumatic stress disorder, unspecified: Secondary | ICD-10-CM | POA: Diagnosis not present

## 2018-07-12 ENCOUNTER — Ambulatory Visit: Payer: Medicare Other | Admitting: Family Medicine

## 2018-07-25 ENCOUNTER — Ambulatory Visit (INDEPENDENT_AMBULATORY_CARE_PROVIDER_SITE_OTHER): Payer: Medicare Other

## 2018-07-25 DIAGNOSIS — J309 Allergic rhinitis, unspecified: Secondary | ICD-10-CM

## 2018-07-26 DIAGNOSIS — F4310 Post-traumatic stress disorder, unspecified: Secondary | ICD-10-CM | POA: Diagnosis not present

## 2018-08-01 ENCOUNTER — Ambulatory Visit (INDEPENDENT_AMBULATORY_CARE_PROVIDER_SITE_OTHER): Payer: Medicare Other

## 2018-08-01 DIAGNOSIS — J309 Allergic rhinitis, unspecified: Secondary | ICD-10-CM | POA: Diagnosis not present

## 2018-08-07 ENCOUNTER — Other Ambulatory Visit: Payer: Self-pay

## 2018-08-07 ENCOUNTER — Encounter: Payer: Self-pay | Admitting: Physician Assistant

## 2018-08-07 ENCOUNTER — Ambulatory Visit (INDEPENDENT_AMBULATORY_CARE_PROVIDER_SITE_OTHER): Payer: Medicare Other | Admitting: Physician Assistant

## 2018-08-07 DIAGNOSIS — F3112 Bipolar disorder, current episode manic without psychotic features, moderate: Secondary | ICD-10-CM

## 2018-08-07 DIAGNOSIS — F411 Generalized anxiety disorder: Secondary | ICD-10-CM | POA: Diagnosis not present

## 2018-08-07 MED ORDER — BUSPIRONE HCL 15 MG PO TABS: 15.0000 mg | ORAL_TABLET | Freq: Two times a day (BID) | ORAL | 3 refills | Status: DC

## 2018-08-07 MED ORDER — CARIPRAZINE HCL 3 MG PO CAPS: 3.0000 mg | ORAL_CAPSULE | Freq: Every day | ORAL | 0 refills | Status: DC

## 2018-08-07 NOTE — Progress Notes (Signed)
[facility]  Patient ID: Candace Browning,  MRN: [id number]  PCP: Bartholome Bill, MD  Date of Evaluation: 08/07/2018 Time spent:15 minutes  Chief Complaint:  Chief Complaint    Follow-up     Virtual Visit via Telephone Note  I connected with patient by a video enabled telemedicine application or telephone, with their informed consent, and verified patient privacy and that I am speaking with the correct person using two identifiers.  I am private, in my home and the patient is home.   I discussed the limitations, risks, security and privacy concerns of performing an evaluation and management service by telephone and the availability of in person appointments. I also discussed with the patient that there may be a patient responsible charge related to this service. The patient expressed understanding and agreed to proceed.   I discussed the assessment and treatment plan with the patient. The patient was provided an opportunity to ask questions and all were answered. The patient agreed with the plan and demonstrated an understanding of the instructions.   The patient was advised to call back or seek an in-person evaluation if the symptoms worsen or if the condition fails to improve as anticipated.  I provided 15 minutes of non-face-to-face time during this encounter.  HISTORY/CURRENT STATUS: HPI For routine f/u. "I'm think I'm getting manic."  Spending more the past couple of weeks. "I'm getting a box delivered every day w/ something that I don't really need." Also has a bit more energy, even though she is tired.  She is sleeping well and feels that she is getting enough sleep.  Denies risky behavior.  No increased libido.  No grandiosity.  No increased talkativeness.  No irritability or anger outbursts.  Patient denies loss of interest in usual activities and is able to enjoy things.  Denies decreased energy or motivation.  Appetite has not changed.  No extreme sadness,  tearfulness, or feelings of hopelessness.  Denies any changes in concentration, making decisions or remembering things.  Denies suicidal or homicidal thoughts.  Anxiety is controlled.  Not working due to the coronavirus pandemic has decreased her anxiety.  She is not needing the Xanax as often.  Denies dizziness, syncope, seizures, numbness, tingling, tremor, tics, unsteady gait, slurred speech, confusion. Denies muscle or joint pain, stiffness, or dystonia.  Individual Medical History/ Review of Systems: Changes? :No    Past medications for mental health diagnoses include: BuSpar, Xanax, Latuda, Depakote, Vistaril, Lamictal, Valium, Seroquel, Abilify, Risperdal, lithium, Equetro, Geodon, prazosin,doxazosin, Luvox, Zoloft, Vraylar, Wellbutrin, Rexulti, Zyprexa caused confusion and stuttering  Allergies: Apple; Coconut oil; Gluten meal; Latex; Nsaids; Other; Peanut-containing drug products; Sulfa antibiotics; Hydrogen peroxide; Penicillins; and Wheat bran  Current Medications:  Current Outpatient Medications:  .  albuterol (PROVENTIL HFA;VENTOLIN HFA) 108 (90 Base) MCG/ACT inhaler, Inhale 2 puffs into the lungs every 6 (six) hours as needed., Disp: 18 g, Rfl: 1 .  ALPRAZolam (XANAX) 0.25 MG tablet, Take 1 tablet (0.25 mg total) by mouth 3 (three) times daily as needed for anxiety., Disp: 90 tablet, Rfl: 5 .  buPROPion (WELLBUTRIN XL) 300 MG 24 hr tablet, Take 1 tablet (300 mg total) by mouth daily., Disp: 90 tablet, Rfl: 1 .  busPIRone (BUSPAR) 15 MG tablet, Take 1 tablet (15 mg total) by mouth 2 (two) times daily., Disp: 180 tablet, Rfl: 3 .  cholecalciferol (VITAMIN D3) 25 MCG (1000 UT) tablet, Take 2,000 Units by mouth daily. , Disp: , Rfl:  .  EPINEPHrine 0.3 mg/0.3  mL IJ SOAJ injection, Inject 0.3 mg into the muscle as needed., Disp: , Rfl:  .  fexofenadine (ALLEGRA) 180 MG tablet, Take 180 mg by mouth daily., Disp: , Rfl:  .  valACYclovir (VALTREX) 500 MG tablet, Take 1 tablet (500 mg  total) by mouth 2 (two) times daily as needed (outbreaks)., Disp: 30 tablet, Rfl: 0 .  cariprazine (VRAYLAR) capsule, Take 1 capsule (3 mg total) by mouth daily., Disp: 30 capsule, Rfl: 0 .  LACTOBACILLUS ACID-PECTIN PO, Take by mouth., Disp: , Rfl:  .  triamcinolone (NASACORT) 55 MCG/ACT AERO nasal inhaler, Place 2 sprays into the nose daily for 30 days., Disp: 1 Bottle, Rfl: 5 .  Triamcinolone Acetonide (NASACORT AQ NA), Place into the nose., Disp: , Rfl:  Medication Side Effects: none  Family Medical/ Social History: Changes? Yes she works at [employer] and has not worked in several months because of the coronavirus pandemic.  MENTAL HEALTH EXAM:  There were no vitals taken for this visit.There is no height or weight on file to calculate BMI.  General Appearance: Unable to assess  Eye Contact:  Unable to assess  Speech:  Clear and Coherent  Volume:  Normal  Mood:  Euthymic  Affect:  Unable to assess  Thought Process:  Goal Directed  Orientation:  Full (Time, Place, and Person)  Thought Content: Logical   Suicidal Thoughts:  No  Homicidal Thoughts:  No  Memory:  WNL  Judgement:  Good  Insight:  Good  Psychomotor Activity:  Unable to assess  Concentration:  Concentration: Good  Recall:  Good  Fund of Knowledge: Good  Language: Good  Assets:  Desire for Improvement  ADL's:  Intact  Cognition: WNL  Prognosis:  Good    DIAGNOSES:    ICD-10-CM   1. Bipolar 1 disorder with moderate mania ([place]) F31.12   2. Generalized anxiety disorder F41.1     Receiving Psychotherapy: No    RECOMMENDATIONS:  Increase Vraylar to 3 mg p.o. daily. Continue Xanax 0.25 mg 3 times daily as needed. Continue Wellbutrin XL 300 mg daily. Continue BuSpar 15 mg twice daily. Return in 4 weeks.  Donnal Moat, PA-C   This record has been created using [employer].  Chart creation errors have been sought, but may not always have been located and corrected. Such creation errors do not reflect on  the standard of medical care.

## 2018-08-08 ENCOUNTER — Ambulatory Visit (INDEPENDENT_AMBULATORY_CARE_PROVIDER_SITE_OTHER): Payer: Medicare Other

## 2018-08-08 DIAGNOSIS — J309 Allergic rhinitis, unspecified: Secondary | ICD-10-CM | POA: Diagnosis not present

## 2018-08-16 DIAGNOSIS — F4310 Post-traumatic stress disorder, unspecified: Secondary | ICD-10-CM | POA: Diagnosis not present

## 2018-08-20 ENCOUNTER — Ambulatory Visit (INDEPENDENT_AMBULATORY_CARE_PROVIDER_SITE_OTHER): Payer: Medicare Other | Admitting: *Deleted

## 2018-08-20 DIAGNOSIS — J309 Allergic rhinitis, unspecified: Secondary | ICD-10-CM | POA: Diagnosis not present

## 2018-08-27 ENCOUNTER — Ambulatory Visit (INDEPENDENT_AMBULATORY_CARE_PROVIDER_SITE_OTHER): Payer: Medicare Other | Admitting: *Deleted

## 2018-08-27 DIAGNOSIS — J309 Allergic rhinitis, unspecified: Secondary | ICD-10-CM

## 2018-09-04 ENCOUNTER — Ambulatory Visit: Payer: Medicare Other | Admitting: Physician Assistant

## 2018-09-08 ENCOUNTER — Other Ambulatory Visit: Payer: Self-pay | Admitting: Physician Assistant

## 2018-09-09 ENCOUNTER — Ambulatory Visit (INDEPENDENT_AMBULATORY_CARE_PROVIDER_SITE_OTHER): Payer: Medicare Other

## 2018-09-09 DIAGNOSIS — J309 Allergic rhinitis, unspecified: Secondary | ICD-10-CM

## 2018-09-12 DIAGNOSIS — F4310 Post-traumatic stress disorder, unspecified: Secondary | ICD-10-CM | POA: Diagnosis not present

## 2018-09-23 ENCOUNTER — Ambulatory Visit (INDEPENDENT_AMBULATORY_CARE_PROVIDER_SITE_OTHER): Payer: Medicare Other

## 2018-09-23 DIAGNOSIS — J309 Allergic rhinitis, unspecified: Secondary | ICD-10-CM

## 2018-09-27 DIAGNOSIS — Z01419 Encounter for gynecological examination (general) (routine) without abnormal findings: Secondary | ICD-10-CM | POA: Diagnosis not present

## 2018-09-27 DIAGNOSIS — Z6821 Body mass index (BMI) 21.0-21.9, adult: Secondary | ICD-10-CM | POA: Diagnosis not present

## 2018-09-27 DIAGNOSIS — Z124 Encounter for screening for malignant neoplasm of cervix: Secondary | ICD-10-CM | POA: Diagnosis not present

## 2018-09-30 ENCOUNTER — Ambulatory Visit (INDEPENDENT_AMBULATORY_CARE_PROVIDER_SITE_OTHER): Payer: Medicare Other

## 2018-09-30 DIAGNOSIS — J309 Allergic rhinitis, unspecified: Secondary | ICD-10-CM

## 2018-10-04 ENCOUNTER — Other Ambulatory Visit: Payer: Self-pay

## 2018-10-04 ENCOUNTER — Encounter: Payer: Self-pay | Admitting: Physician Assistant

## 2018-10-04 ENCOUNTER — Ambulatory Visit (INDEPENDENT_AMBULATORY_CARE_PROVIDER_SITE_OTHER): Payer: Medicare Other | Admitting: Physician Assistant

## 2018-10-04 DIAGNOSIS — G259 Extrapyramidal and movement disorder, unspecified: Secondary | ICD-10-CM | POA: Diagnosis not present

## 2018-10-04 DIAGNOSIS — G4700 Insomnia, unspecified: Secondary | ICD-10-CM | POA: Diagnosis not present

## 2018-10-04 DIAGNOSIS — F3112 Bipolar disorder, current episode manic without psychotic features, moderate: Secondary | ICD-10-CM | POA: Diagnosis not present

## 2018-10-04 DIAGNOSIS — F411 Generalized anxiety disorder: Secondary | ICD-10-CM | POA: Diagnosis not present

## 2018-10-04 MED ORDER — TRIHEXYPHENIDYL HCL 2 MG PO TABS: 2.0000 mg | ORAL_TABLET | Freq: Two times a day (BID) | ORAL | 1 refills | Status: DC

## 2018-10-04 MED ORDER — ALPRAZOLAM 0.25 MG PO TABS: 0.2500 mg | ORAL_TABLET | Freq: Four times a day (QID) | ORAL | 5 refills | Status: DC | PRN

## 2018-10-04 NOTE — Progress Notes (Signed)
[facility]  Patient ID: Candace Browning,  MRN: [id number]  PCP: Bartholome Bill, MD  Date of Evaluation: 10/04/2018 Time spent:25 minutes  Chief Complaint:  Chief Complaint    Follow-up      HISTORY/CURRENT STATUS: HPI For routine med check.  Mentally, she is doing well.  Denies increased energy with decreased need for sleep.  No impulsivity or risky behavior, no increased libido or spending.  No grandiosity.  Denies anhedonia, energy and motivation are good.  No easy crying.  No isolating.  Denies suicidal or homicidal thoughts.  The only complaint she has is "shaking."  States her whole body will shake suddenly for no reason.  It may last a few seconds or few minutes or when she "we wills it to stop."  This is been going on for months now.  She does not think it started when we began the [place] and did not worsen when we increased the dose at the last visit.  "I do not want to stop the Vraylar.  It is working well."  Anxiety is controlled with the Xanax but sometimes she needs more than 3 pills a day.  She will often take 2 pills before bed but if she is needed to during the day then she will not have enough to last the whole day.  Denies dizziness, syncope, seizures, numbness, tingling, unsteady gait, slurred speech, confusion.  Denies feeling like she cannot sit still or wants to crawl out of her skin.Denies muscle or joint pain, stiffness, or dystonia.  Individual Medical History/ Review of Systems: Changes? :No    Past medications for mental health diagnoses include: BuSpar, Xanax, Latuda, Depakote, Vistaril, Lamictal, Valium, Seroquel, Abilify, Risperdal, lithium, Equetro, Geodon, prazosin,doxazosin, Luvox, Zoloft, Vraylar, Wellbutrin, Rexulti, Zyprexa caused confusion and stuttering  Allergies: Apple, Coconut oil, Gluten meal, Latex, Nsaids, Other, Peanut-containing drug products, Sulfa antibiotics, Hydrogen peroxide, Penicillins, and Wheat  bran  Current Medications:  Current Outpatient Medications:  .  albuterol (PROVENTIL HFA;VENTOLIN HFA) 108 (90 Base) MCG/ACT inhaler, Inhale 2 puffs into the lungs every 6 (six) hours as needed., Disp: 18 g, Rfl: 1 .  ALPRAZolam (XANAX) 0.25 MG tablet, Take 1 tablet (0.25 mg total) by mouth 4 (four) times daily as needed for anxiety., Disp: 120 tablet, Rfl: 5 .  buPROPion (WELLBUTRIN XL) 300 MG 24 hr tablet, Take 1 tablet (300 mg total) by mouth daily., Disp: 90 tablet, Rfl: 1 .  busPIRone (BUSPAR) 15 MG tablet, Take 1 tablet (15 mg total) by mouth 2 (two) times daily., Disp: 180 tablet, Rfl: 3 .  cholecalciferol (VITAMIN D3) 25 MCG (1000 UT) tablet, Take 2,000 Units by mouth daily. , Disp: , Rfl:  .  EPINEPHrine 0.3 mg/0.3 mL IJ SOAJ injection, Inject 0.3 mg into the muscle as needed., Disp: , Rfl:  .  fexofenadine (ALLEGRA) 180 MG tablet, Take 180 mg by mouth daily., Disp: , Rfl:  .  Triamcinolone Acetonide (NASACORT AQ NA), Place into the nose., Disp: , Rfl:  .  valACYclovir (VALTREX) 500 MG tablet, Take 1 tablet (500 mg total) by mouth 2 (two) times daily as needed (outbreaks)., Disp: 30 tablet, Rfl: 0 .  VRAYLAR capsule, TAKE ONE CAPSULE BY MOUTH DAILY (Patient taking differently: 3 mg. ), Disp: 30 capsule, Rfl: 0 .  LACTOBACILLUS ACID-PECTIN PO, Take by mouth., Disp: , Rfl:  .  triamcinolone (NASACORT) 55 MCG/ACT AERO nasal inhaler, Place 2 sprays into the nose daily for 30 days., Disp: 1 Bottle, Rfl: 5 .  trihexyphenidyl (ARTANE) 2 MG tablet, Take 1 tablet (2 mg total) by mouth 2 (two) times daily with a meal., Disp: 60 tablet, Rfl: 1 Medication Side Effects: none  Family Medical/ Social History: Changes? Yes job at [employer] is changing to 3, 4-10:00 shifts.  D/T COVID.  Concerned it might throw her whole schedule off.  MENTAL HEALTH EXAM:  There were no vitals taken for this visit.There is no height or weight on file to calculate BMI.  General Appearance: Casual  Eye Contact:  Good   Speech:  Clear and Coherent  Volume:  Normal  Mood:  Euthymic  Affect:  Appropriate  Thought Process:  Goal Directed  Orientation:  Full (Time, Place, and Person)  Thought Content: Logical   Suicidal Thoughts:  No  Homicidal Thoughts:  No  Memory:  WNL  Judgement:  Good  Insight:  Good  Psychomotor Activity:  Normal no tremor.  Gait is normal.  No rigidity.  Concentration:  Concentration: Good  Recall:  Good  Fund of Knowledge: Good  Language: Good  Assets:  Desire for Improvement  ADL's:  Intact  Cognition: WNL  Prognosis:  Good    DIAGNOSES:    ICD-10-CM   1. Bipolar 1 disorder with moderate mania ([place])  F31.12   2. Generalized anxiety disorder  F41.1   3. Insomnia, unspecified type  G47.00   4. Extrapyramidal and movement disorder, unspecified  G25.9     Receiving Psychotherapy: No    RECOMMENDATIONS:  Start Artane 2 mg twice daily.  Sedation precautions, dry mouth, constipation, are all common side effects.  Increase fluid intake, eat high-fiber foods.  If intolerable let me know. Continue Xanax 0.25 mg but increase quantity so she can have 1 4 times daily as needed or 2 p.o. twice daily.  Just not more than 4 daily. Continue Wellbutrin XL 300 mg daily. Continue BuSpar 15 mg twice daily. Continue Vraylar 3 mg daily. Return in 4 weeks.  Donnal Moat, PA-C   This record has been created using [employer].  Chart creation errors have been sought, but may not always have been located and corrected. Such creation errors do not reflect on the standard of medical care.

## 2018-10-07 ENCOUNTER — Ambulatory Visit (INDEPENDENT_AMBULATORY_CARE_PROVIDER_SITE_OTHER): Payer: Medicare Other | Admitting: *Deleted

## 2018-10-07 DIAGNOSIS — F4310 Post-traumatic stress disorder, unspecified: Secondary | ICD-10-CM | POA: Diagnosis not present

## 2018-10-07 DIAGNOSIS — J309 Allergic rhinitis, unspecified: Secondary | ICD-10-CM

## 2018-10-08 ENCOUNTER — Other Ambulatory Visit: Payer: Self-pay | Admitting: Physician Assistant

## 2018-10-18 ENCOUNTER — Ambulatory Visit (INDEPENDENT_AMBULATORY_CARE_PROVIDER_SITE_OTHER): Payer: Medicare Other

## 2018-10-18 DIAGNOSIS — J309 Allergic rhinitis, unspecified: Secondary | ICD-10-CM | POA: Diagnosis not present

## 2018-10-28 DIAGNOSIS — F4310 Post-traumatic stress disorder, unspecified: Secondary | ICD-10-CM | POA: Diagnosis not present

## 2018-10-31 ENCOUNTER — Ambulatory Visit (INDEPENDENT_AMBULATORY_CARE_PROVIDER_SITE_OTHER): Payer: Medicare Other

## 2018-10-31 DIAGNOSIS — J309 Allergic rhinitis, unspecified: Secondary | ICD-10-CM | POA: Diagnosis not present

## 2018-10-31 NOTE — Progress Notes (Signed)
VIALS EXP 11-04-19

## 2018-11-04 DIAGNOSIS — J301 Allergic rhinitis due to pollen: Secondary | ICD-10-CM | POA: Diagnosis not present

## 2018-11-05 DIAGNOSIS — J3089 Other allergic rhinitis: Secondary | ICD-10-CM | POA: Diagnosis not present

## 2018-11-06 DIAGNOSIS — J3081 Allergic rhinitis due to animal (cat) (dog) hair and dander: Secondary | ICD-10-CM | POA: Diagnosis not present

## 2018-11-11 ENCOUNTER — Ambulatory Visit (INDEPENDENT_AMBULATORY_CARE_PROVIDER_SITE_OTHER): Payer: Medicare Other | Admitting: Physician Assistant

## 2018-11-11 ENCOUNTER — Other Ambulatory Visit: Payer: Self-pay

## 2018-11-11 ENCOUNTER — Encounter: Payer: Self-pay | Admitting: Physician Assistant

## 2018-11-11 DIAGNOSIS — G4700 Insomnia, unspecified: Secondary | ICD-10-CM | POA: Diagnosis not present

## 2018-11-11 DIAGNOSIS — G259 Extrapyramidal and movement disorder, unspecified: Secondary | ICD-10-CM

## 2018-11-11 DIAGNOSIS — F3112 Bipolar disorder, current episode manic without psychotic features, moderate: Secondary | ICD-10-CM | POA: Diagnosis not present

## 2018-11-11 DIAGNOSIS — F411 Generalized anxiety disorder: Secondary | ICD-10-CM | POA: Diagnosis not present

## 2018-11-11 MED ORDER — ALPRAZOLAM 0.5 MG PO TABS: 0.5000 mg | ORAL_TABLET | Freq: Two times a day (BID) | ORAL | 1 refills | Status: DC | PRN

## 2018-11-11 MED ORDER — CARIPRAZINE HCL 3 MG PO CAPS: 3.0000 mg | ORAL_CAPSULE | Freq: Every day | ORAL | 1 refills | Status: DC

## 2018-11-11 NOTE — Progress Notes (Signed)
[facility]  Patient ID: Candace Browning,  MRN: [id number]  PCP: Bartholome Bill, MD  Date of Evaluation: 11/11/2018 Time spent:30 minutes  Chief Complaint:  Chief Complaint    Follow-up     Virtual Visit via Telephone Note  I connected with patient by a video enabled telemedicine application or telephone, with their informed consent, and verified patient privacy and that I am speaking with the correct person using two identifiers.  I am private, in my home and the patient is home.  I discussed the limitations, risks, security and privacy concerns of performing an evaluation and management service by telephone and the availability of in person appointments. I also discussed with the patient that there may be a patient responsible charge related to this service. The patient expressed understanding and agreed to proceed.   I discussed the assessment and treatment plan with the patient. The patient was provided an opportunity to ask questions and all were answered. The patient agreed with the plan and demonstrated an understanding of the instructions.   The patient was advised to call back or seek an in-person evaluation if the symptoms worsen or if the condition fails to improve as anticipated.  I provided 30 minutes of non-face-to-face time during this encounter.  HISTORY/CURRENT STATUS: HPI For routine med.   The Artane made her 'feel off.  I can't describe it. I went off b/c we were buying a house and my husband and I did not think it was a good time to be trying any drug.  He only took it for about 4 days.'  States the body "shaking" has actually been some better.  She has noticed that her legs sometimes jerk in the evenings and has even woken her up because they are moving.  More often than not, in the evenings she will feel like she is going to crawl out of her skin and has to keep moving.  There have been a few times where she is taking Xanax, but had to double  the dose, in the evening because she felt so anxious with her legs moving and she could not stop it.  The Xanax did help stop the movements and she was able to sleep well.  Yesterday, she did have a bout of uncontrollable leg movements during the day that lasted a few minutes.  Also reports having a couple of sores on her tongue at different times in the past month or so.  She does not notice herself chewing her tongue or have an abnormal mouth movements.  States her husband or son have not said anything about it either.  She and her husband have moved into a new house and she had not been consistently taking her medicine for a few weeks.  For the past couple of weeks, she has been more consistent with it.  She was feeling a little manic up until last week, but since being back on her meds, she feels more stable.  She is able to enjoy things.  Energy and motivation are good.  Not isolating.  Denies suicidal or homicidal thoughts.  Now she is sleeping good and denies increased energy.  No impulsivity or risky behaviors, no increased spending or libido, no grandiosity.  No hallucinations.  Denies dizziness, syncope, seizures, numbness, tingling, unsteady gait, slurred speech, confusion. Denies muscle or joint pain, stiffness, or dystonia.  Individual Medical History/ Review of Systems: Changes? :No    Past medications for mental health diagnoses include: BuSpar, Xanax,  Latuda, Depakote, Vistaril, Lamictal, Valium, Seroquel, Abilify, Risperdal, lithium, Equetro, Geodon, prazosin,doxazosin, Luvox, Zoloft, Vraylar, Wellbutrin, Rexulti, Zyprexa caused confusion and stuttering, Gabapentin unsure if it helped or not.   Allergies: Apple, Coconut oil, Gluten meal, Latex, Nsaids, Other, Peanut-containing drug products, Sulfa antibiotics, Hydrogen peroxide, Penicillins, and Wheat bran  Current Medications:  Current Outpatient Medications:  .  albuterol (PROVENTIL HFA;VENTOLIN HFA) 108 (90 Base) MCG/ACT  inhaler, Inhale 2 puffs into the lungs every 6 (six) hours as needed., Disp: 18 g, Rfl: 1 .  buPROPion (WELLBUTRIN XL) 300 MG 24 hr tablet, Take 1 tablet (300 mg total) by mouth daily., Disp: 90 tablet, Rfl: 1 .  busPIRone (BUSPAR) 15 MG tablet, Take 1 tablet (15 mg total) by mouth 2 (two) times daily., Disp: 180 tablet, Rfl: 3 .  cariprazine (VRAYLAR) capsule, Take 1 capsule (3 mg total) by mouth daily., Disp: 30 capsule, Rfl: 1 .  cholecalciferol (VITAMIN D3) 25 MCG (1000 UT) tablet, Take 1,000 Units by mouth daily. , Disp: , Rfl:  .  EPINEPHrine 0.3 mg/0.3 mL IJ SOAJ injection, Inject 0.3 mg into the muscle as needed., Disp: , Rfl:  .  levocetirizine (XYZAL) 5 MG tablet, Take 5 mg by mouth every evening., Disp: , Rfl:  .  valACYclovir (VALTREX) 500 MG tablet, Take 1 tablet (500 mg total) by mouth 2 (two) times daily as needed (outbreaks)., Disp: 30 tablet, Rfl: 0 .  ALPRAZolam (XANAX) 0.5 MG tablet, Take 1 tablet (0.5 mg total) by mouth 2 (two) times daily as needed for anxiety., Disp: 60 tablet, Rfl: 1 .  fexofenadine (ALLEGRA) 180 MG tablet, Take 180 mg by mouth daily., Disp: , Rfl:  .  LACTOBACILLUS ACID-PECTIN PO, Take by mouth., Disp: , Rfl:  .  triamcinolone (NASACORT) 55 MCG/ACT AERO nasal inhaler, Place 2 sprays into the nose daily for 30 days., Disp: 1 Bottle, Rfl: 5 .  Triamcinolone Acetonide (NASACORT AQ NA), Place into the nose., Disp: , Rfl:  .  trihexyphenidyl (ARTANE) 2 MG tablet, Take 1 tablet (2 mg total) by mouth 2 (two) times daily with a meal. (Patient not taking: Reported on 11/11/2018), Disp: 60 tablet, Rfl: 1 Medication Side Effects: Questionable tremor  Family Medical/ Social History: Changes? Yes she and her family moved after buying a new house  MENTAL HEALTH EXAM:  There were no vitals taken for this visit.There is no height or weight on file to calculate BMI.  General Appearance: Unable to assess  Eye Contact:  Unable to assess  Speech:  Clear and Coherent   Volume:  Normal  Mood:  Euthymic  Affect:  Unable to assess  Thought Process:  Goal Directed  Orientation:  Full (Time, Place, and Person)  Thought Content: Logical   Suicidal Thoughts:  No  Homicidal Thoughts:  No  Memory:  WNL  Judgement:  Good  Insight:  Good  Psychomotor Activity:  Unable to assess  Concentration:  Concentration: Good  Recall:  Good  Fund of Knowledge: Good  Language: Good  Assets:  Desire for Improvement  ADL's:  Intact  Cognition: WNL  Prognosis:  Good    DIAGNOSES:    ICD-10-CM   1. Bipolar 1 disorder with moderate mania ([place])  F31.12   2. Extrapyramidal and movement disorder, unspecified  G25.9   3. Generalized anxiety disorder  F41.1   4. Insomnia, unspecified type  G47.00     Receiving Psychotherapy: No    RECOMMENDATIONS:  I spent 30 minutes with her and 50% of that  time was in counseling.  We discussed different options for treatment.  It sounds like she has developed restless leg syndrome but it is difficult to know whether this is secondary to medications or not.  She could either take Xanax every evening, not as needed, and that may be enough to help stop the abnormal movements and allow her to sleep.  Another option would be retrying the Artane, especially if the other limb movements recur.  Another option would be to retry the gabapentin.  She prefers to use the Xanax first.  She will try this for a couple of weeks and if it does not completely effective, then she will restart the Artane. Increase Xanax to 0.5 mg 1 twice daily as needed, and she will take 1 every evening for about 2 weeks to see if that is effective. Continue Wellbutrin XL 300 mg daily. Continue BuSpar 15 mg twice daily. Continue Vraylar 3 mg daily. Restart Artane 2 mg twice daily, in 2 weeks, if the Xanax alone is not effective.  She verbalizes understanding. Return in 4 to 6 weeks.  Donnal Moat, PA-C   This record has been created using [employer].  Chart  creation errors have been sought, but may not always have been located and corrected. Such creation errors do not reflect on the standard of medical care.

## 2018-11-13 ENCOUNTER — Other Ambulatory Visit: Payer: Self-pay

## 2018-11-13 ENCOUNTER — Telehealth: Payer: Self-pay | Admitting: Physician Assistant

## 2018-11-13 MED ORDER — CARIPRAZINE HCL 3 MG PO CAPS: 3.0000 mg | ORAL_CAPSULE | Freq: Every day | ORAL | 1 refills | Status: DC

## 2018-11-13 NOTE — Telephone Encounter (Signed)
Refill sent to [facility] per request

## 2018-11-13 NOTE — Telephone Encounter (Signed)
You called in her vraylar to [facility] but they don't have it and not sure when they'd get it.  Will you please send it to [facility] on [address] in [place].

## 2018-11-15 ENCOUNTER — Ambulatory Visit (INDEPENDENT_AMBULATORY_CARE_PROVIDER_SITE_OTHER): Payer: Medicare Other

## 2018-11-15 DIAGNOSIS — J309 Allergic rhinitis, unspecified: Secondary | ICD-10-CM

## 2018-11-15 NOTE — Progress Notes (Signed)
Patient informed me that since she has moved to [facility] and is no longer working mornings she will need her vials to be transferred to [facility]. Vida Roller is in office today and I will send vials with her. Patient is aware that next week she will start going to the [place] location.

## 2018-11-21 DIAGNOSIS — F4310 Post-traumatic stress disorder, unspecified: Secondary | ICD-10-CM | POA: Diagnosis not present

## 2018-11-22 ENCOUNTER — Ambulatory Visit (INDEPENDENT_AMBULATORY_CARE_PROVIDER_SITE_OTHER): Payer: Medicare Other | Admitting: *Deleted

## 2018-11-22 DIAGNOSIS — J309 Allergic rhinitis, unspecified: Secondary | ICD-10-CM

## 2018-11-28 ENCOUNTER — Ambulatory Visit (INDEPENDENT_AMBULATORY_CARE_PROVIDER_SITE_OTHER): Payer: Medicare Other | Admitting: *Deleted

## 2018-11-28 DIAGNOSIS — J309 Allergic rhinitis, unspecified: Secondary | ICD-10-CM | POA: Diagnosis not present

## 2018-12-05 ENCOUNTER — Ambulatory Visit (INDEPENDENT_AMBULATORY_CARE_PROVIDER_SITE_OTHER): Payer: Medicare Other | Admitting: *Deleted

## 2018-12-05 DIAGNOSIS — J309 Allergic rhinitis, unspecified: Secondary | ICD-10-CM

## 2018-12-10 ENCOUNTER — Other Ambulatory Visit: Payer: Self-pay

## 2018-12-10 MED ORDER — BUPROPION HCL ER (XL) 300 MG PO TB24: 300.0000 mg | ORAL_TABLET | Freq: Every day | ORAL | 1 refills | Status: DC

## 2018-12-12 DIAGNOSIS — F4310 Post-traumatic stress disorder, unspecified: Secondary | ICD-10-CM | POA: Diagnosis not present

## 2018-12-16 ENCOUNTER — Ambulatory Visit (INDEPENDENT_AMBULATORY_CARE_PROVIDER_SITE_OTHER): Payer: Medicare Other | Admitting: *Deleted

## 2018-12-16 DIAGNOSIS — J309 Allergic rhinitis, unspecified: Secondary | ICD-10-CM | POA: Diagnosis not present

## 2018-12-30 ENCOUNTER — Ambulatory Visit (INDEPENDENT_AMBULATORY_CARE_PROVIDER_SITE_OTHER): Payer: Medicare Other | Admitting: *Deleted

## 2018-12-30 DIAGNOSIS — J309 Allergic rhinitis, unspecified: Secondary | ICD-10-CM | POA: Diagnosis not present

## 2018-12-31 DIAGNOSIS — J029 Acute pharyngitis, unspecified: Secondary | ICD-10-CM | POA: Diagnosis not present

## 2018-12-31 DIAGNOSIS — R1310 Dysphagia, unspecified: Secondary | ICD-10-CM | POA: Diagnosis not present

## 2019-01-02 ENCOUNTER — Encounter: Payer: Self-pay | Admitting: Physician Assistant

## 2019-01-02 ENCOUNTER — Other Ambulatory Visit: Payer: Self-pay

## 2019-01-02 ENCOUNTER — Ambulatory Visit (INDEPENDENT_AMBULATORY_CARE_PROVIDER_SITE_OTHER): Payer: Medicare Other | Admitting: Physician Assistant

## 2019-01-02 DIAGNOSIS — F411 Generalized anxiety disorder: Secondary | ICD-10-CM

## 2019-01-02 DIAGNOSIS — G4700 Insomnia, unspecified: Secondary | ICD-10-CM | POA: Diagnosis not present

## 2019-01-02 DIAGNOSIS — F3112 Bipolar disorder, current episode manic without psychotic features, moderate: Secondary | ICD-10-CM | POA: Diagnosis not present

## 2019-01-02 MED ORDER — VRAYLAR 4.5 MG PO CAPS: 4.5000 mg | ORAL_CAPSULE | Freq: Every day | ORAL | 1 refills | Status: DC

## 2019-01-02 NOTE — Progress Notes (Signed)
[facility]  Patient ID: Candace Browning,  MRN: [id number]  PCP: Bartholome Bill, MD  Date of Evaluation: 01/02/2019 Time spent:15 minutes  Chief Complaint:  Chief Complaint    Follow-up     HISTORY/CURRENT STATUS: HPI For routine med.   She and husband are now settled in their new home. Liking it.  She notices having more ups every few weeks, but no real depression, or if she does feel "down" it is not bad.  She has been spending more money during those 2 to 3 days that she feels a little manic every few weeks.  No impulsivity or risky behaviors.  No grandiosity.  No increased libido.  No hallucinations.  Patient denies loss of interest in usual activities and is able to enjoy things.  Denies decreased energy or motivation.  Appetite has not changed.  No extreme sadness, tearfulness, or feelings of hopelessness.  Denies any changes in concentration, making decisions or remembering things.  Denies suicidal or homicidal thoughts.  Denies dizziness, syncope, seizures, numbness, tingling, unsteady gait, slurred speech, confusion. Denies muscle or joint pain, stiffness, or dystonia.  Individual Medical History/ Review of Systems: Changes? :No    Past medications for mental health diagnoses include: BuSpar, Xanax, Latuda, Depakote, Vistaril, Lamictal, Valium, Seroquel, Abilify, Risperdal, lithium, Equetro, Geodon, prazosin,doxazosin, Luvox, Zoloft, Vraylar, Wellbutrin, Rexulti, Zyprexa caused confusion and stuttering, Gabapentin unsure if it helped or not.   Allergies: Apple, Coconut oil, Gluten meal, Latex, Nsaids, Other, Peanut-containing drug products, Sulfa antibiotics, Hydrogen peroxide, Penicillins, and Wheat bran  Current Medications:  Current Outpatient Medications:  .  albuterol (PROVENTIL HFA;VENTOLIN HFA) 108 (90 Base) MCG/ACT inhaler, Inhale 2 puffs into the lungs every 6 (six) hours as needed., Disp: 18 g, Rfl: 1 .  ALPRAZolam (XANAX) 0.5 MG tablet,  Take 1 tablet (0.5 mg total) by mouth 2 (two) times daily as needed for anxiety., Disp: 60 tablet, Rfl: 1 .  buPROPion (WELLBUTRIN XL) 300 MG 24 hr tablet, Take 1 tablet (300 mg total) by mouth daily., Disp: 90 tablet, Rfl: 1 .  busPIRone (BUSPAR) 15 MG tablet, Take 1 tablet (15 mg total) by mouth 2 (two) times daily., Disp: 180 tablet, Rfl: 3 .  cholecalciferol (VITAMIN D3) 25 MCG (1000 UT) tablet, Take 1,000 Units by mouth daily. , Disp: , Rfl:  .  EPINEPHrine 0.3 mg/0.3 mL IJ SOAJ injection, Inject 0.3 mg into the muscle as needed., Disp: , Rfl:  .  levocetirizine (XYZAL) 5 MG tablet, Take 5 mg by mouth every evening., Disp: , Rfl:  .  valACYclovir (VALTREX) 500 MG tablet, Take 1 tablet (500 mg total) by mouth 2 (two) times daily as needed (outbreaks)., Disp: 30 tablet, Rfl: 0 .  Cariprazine HCl (VRAYLAR) 4.5 MG CAPS, Take 1 capsule (4.5 mg total) by mouth daily., Disp: 30 capsule, Rfl: 1 .  LACTOBACILLUS ACID-PECTIN PO, Take by mouth., Disp: , Rfl:  .  triamcinolone (NASACORT) 55 MCG/ACT AERO nasal inhaler, Place 2 sprays into the nose daily for 30 days., Disp: 1 Bottle, Rfl: 5 .  Triamcinolone Acetonide (NASACORT AQ NA), Place into the nose., Disp: , Rfl:  Medication Side Effects: Questionable tremor  Family Medical/ Social History: Changes? Working nights now at [employer].  More tired.   MENTAL HEALTH EXAM:  There were no vitals taken for this visit.There is no height or weight on file to calculate BMI.  General Appearance: Casual, Neat and Well Groomed  Eye Contact:  Good  Speech:  Clear and  Coherent  Volume:  Normal  Mood:  Euthymic  Affect:  Appropriate  Thought Process:  Goal Directed and Descriptions of Associations: Intact  Orientation:  Full (Time, Place, and Person)  Thought Content: Logical   Suicidal Thoughts:  No  Homicidal Thoughts:  No  Memory:  WNL  Judgement:  Good  Insight:  Good  Psychomotor Activity:  Normal  Concentration:  Concentration: Good  Recall:  Good   Fund of Knowledge: Good  Language: Good  Assets:  Desire for Improvement  ADL's:  Intact  Cognition: WNL  Prognosis:  Good    DIAGNOSES:    ICD-10-CM   1. Bipolar 1 disorder with moderate mania ([place])  F31.12   2. Generalized anxiety disorder  F41.1   3. Insomnia, unspecified type  G47.00     Receiving Psychotherapy: No    RECOMMENDATIONS:  PDMP was reviewed. Increase Vraylar to 4.5 mg qd.  Continue Xanax 0.5 mg twice daily as needed. Continue Wellbutrin XL 300 mg daily. Continue BuSpar 15 mg 1 twice daily. Return in 4 to 6 weeks.  Donnal Moat, PA-C

## 2019-01-06 ENCOUNTER — Ambulatory Visit (INDEPENDENT_AMBULATORY_CARE_PROVIDER_SITE_OTHER): Payer: Medicare Other | Admitting: *Deleted

## 2019-01-06 DIAGNOSIS — J309 Allergic rhinitis, unspecified: Secondary | ICD-10-CM

## 2019-01-13 ENCOUNTER — Ambulatory Visit (INDEPENDENT_AMBULATORY_CARE_PROVIDER_SITE_OTHER): Payer: Medicare Other

## 2019-01-13 DIAGNOSIS — J309 Allergic rhinitis, unspecified: Secondary | ICD-10-CM | POA: Diagnosis not present

## 2019-01-22 ENCOUNTER — Ambulatory Visit (INDEPENDENT_AMBULATORY_CARE_PROVIDER_SITE_OTHER): Payer: Medicare Other | Admitting: *Deleted

## 2019-01-22 DIAGNOSIS — J309 Allergic rhinitis, unspecified: Secondary | ICD-10-CM | POA: Diagnosis not present

## 2019-02-03 ENCOUNTER — Ambulatory Visit (INDEPENDENT_AMBULATORY_CARE_PROVIDER_SITE_OTHER): Payer: Medicare Other

## 2019-02-03 DIAGNOSIS — J309 Allergic rhinitis, unspecified: Secondary | ICD-10-CM

## 2019-02-10 ENCOUNTER — Ambulatory Visit (INDEPENDENT_AMBULATORY_CARE_PROVIDER_SITE_OTHER): Payer: Medicare Other | Admitting: *Deleted

## 2019-02-10 DIAGNOSIS — J309 Allergic rhinitis, unspecified: Secondary | ICD-10-CM

## 2019-02-11 ENCOUNTER — Other Ambulatory Visit: Payer: Self-pay

## 2019-02-11 ENCOUNTER — Encounter: Payer: Self-pay | Admitting: Physician Assistant

## 2019-02-11 ENCOUNTER — Ambulatory Visit (INDEPENDENT_AMBULATORY_CARE_PROVIDER_SITE_OTHER): Payer: Medicare Other | Admitting: Physician Assistant

## 2019-02-11 DIAGNOSIS — G4700 Insomnia, unspecified: Secondary | ICD-10-CM | POA: Diagnosis not present

## 2019-02-11 DIAGNOSIS — F3112 Bipolar disorder, current episode manic without psychotic features, moderate: Secondary | ICD-10-CM

## 2019-02-11 DIAGNOSIS — F411 Generalized anxiety disorder: Secondary | ICD-10-CM

## 2019-02-11 DIAGNOSIS — G259 Extrapyramidal and movement disorder, unspecified: Secondary | ICD-10-CM | POA: Diagnosis not present

## 2019-02-11 MED ORDER — OXCARBAZEPINE 300 MG PO TABS: ORAL_TABLET | ORAL | 1 refills | Status: DC

## 2019-02-11 MED ORDER — CARIPRAZINE HCL 3 MG PO CAPS: 3.0000 mg | ORAL_CAPSULE | Freq: Every day | ORAL | 2 refills | Status: DC

## 2019-02-11 NOTE — Progress Notes (Signed)
[facility]  Patient ID: Candace Browning,  MRN: [id number]  PCP: Bartholome Bill, MD  Date of Evaluation: 02/11/2019 Time spent:25 minutes  Chief Complaint:  Chief Complaint    Follow-up      HISTORY/CURRENT STATUS: HPI For routine f/u.   We increased the Vraylar at last visit.  Within a few days, she started having akathisia.  She took the higher dose for 9 or 10 days and was not able to tolerate the abnormal movements anymore.  She decreased the Vraylar back to 3 mg and the movements went away in 2 days.  She does still sometimes have restless legs in the evening and at night but it is so much better than it was, is not much of a bother.  She does take Xanax which helps some.  She does not take that every night.  Reports still having hypomania.  She realizes that she wants to spend more money but she does not.  She has been a bit more irritable.  Also not needing as much sleep.  No impulsive or risky behaviors, no increased libido.  No hallucinations.  Patient denies loss of interest in usual activities and is able to enjoy things.  Denies decreased energy or motivation.  Appetite has not changed.  No extreme sadness, tearfulness, or feelings of hopelessness.  Denies any changes in concentration, making decisions or remembering things.  Denies suicidal or homicidal thoughts.  Work is going well at [employer].  Feels that anxiety is controlled for the most part.  Denies dizziness, syncope, seizures, numbness, tingling, tics, unsteady gait, slurred speech, confusion. Denies muscle or joint pain, stiffness, or dystonia.  Individual Medical History/ Review of Systems: Changes? :No    Past medications for mental health diagnoses include: BuSpar, Xanax, Latuda, Depakote, Vistaril, Lamictal, Valium, Seroquel, Abilify, Risperdal, lithium, Equetro, Geodon, prazosin,doxazosin, Luvox, Zoloft, Vraylar, Wellbutrin, Rexulti, Zyprexa caused confusion and stuttering, Gabapentin  unsure if it helped or not.   Allergies: Apple, Coconut oil, Gluten meal, Latex, Nsaids, Other, Peanut-containing drug products, Sulfa antibiotics, Hydrogen peroxide, Penicillins, and Wheat bran  Current Medications:  Current Outpatient Medications:  .  albuterol (PROVENTIL HFA;VENTOLIN HFA) 108 (90 Base) MCG/ACT inhaler, Inhale 2 puffs into the lungs every 6 (six) hours as needed., Disp: 18 g, Rfl: 1 .  ALPRAZolam (XANAX) 0.5 MG tablet, Take 1 tablet (0.5 mg total) by mouth 2 (two) times daily as needed for anxiety., Disp: 60 tablet, Rfl: 1 .  buPROPion (WELLBUTRIN XL) 300 MG 24 hr tablet, Take 1 tablet (300 mg total) by mouth daily., Disp: 90 tablet, Rfl: 1 .  busPIRone (BUSPAR) 15 MG tablet, Take 1 tablet (15 mg total) by mouth 2 (two) times daily., Disp: 180 tablet, Rfl: 3 .  EPINEPHrine 0.3 mg/0.3 mL IJ SOAJ injection, Inject 0.3 mg into the muscle as needed., Disp: , Rfl:  .  levocetirizine (XYZAL) 5 MG tablet, Take 5 mg by mouth every evening., Disp: , Rfl:  .  valACYclovir (VALTREX) 500 MG tablet, Take 1 tablet (500 mg total) by mouth 2 (two) times daily as needed (outbreaks)., Disp: 30 tablet, Rfl: 0 .  cariprazine (VRAYLAR) capsule, Take 1 capsule (3 mg total) by mouth daily., Disp: 30 capsule, Rfl: 2 .  cholecalciferol (VITAMIN D3) 25 MCG (1000 UT) tablet, Take 1,000 Units by mouth daily. , Disp: , Rfl:  .  Oxcarbazepine (TRILEPTAL) 300 MG tablet, 1 po qhs for 4 nights, then 1 po bid., Disp: 60 tablet, Rfl: 1 .  triamcinolone (NASACORT) 55 MCG/ACT AERO nasal inhaler, Place 2 sprays into the nose daily for 30 days., Disp: 1 Bottle, Rfl: 5 Medication Side Effects: Akathisia with high doses of Vraylar  Family Medical/ Social History: Changes? No  MENTAL HEALTH EXAM:  There were no vitals taken for this visit.There is no height or weight on file to calculate BMI.  General Appearance: Casual, Neat and Well Groomed  Eye Contact:  Good  Speech:  Clear and Coherent  Volume:  Normal   Mood:  Euthymic  Affect:  Appropriate  Thought Process:  Goal Directed and Descriptions of Associations: Intact  Orientation:  Full (Time, Place, and Person)  Thought Content: Logical   Suicidal Thoughts:  No  Homicidal Thoughts:  No  Memory:  WNL  Judgement:  Good  Insight:  Good  Psychomotor Activity:  Restlessness in her legs.  Concentration:  Concentration: Good  Recall:  Good  Fund of Knowledge: Good  Language: Good  Assets:  Desire for Improvement  ADL's:  Intact  Cognition: WNL  Prognosis:  Good    DIAGNOSES:    ICD-10-CM   1. Bipolar 1 disorder with moderate mania ([place])  F31.12   2. Generalized anxiety disorder  F41.1   3. Insomnia, unspecified type  G47.00   4. Extrapyramidal and movement disorder, unspecified  G25.9     Receiving Psychotherapy: No    RECOMMENDATIONS:  I spent 25 minutes with her face-to-face and 50% of that time was in counseling concerning her diagnosis and treatment options. We discussed different options to help with the hypomania.  She is tried many different atypicals as well as mood stabilizers.  She is never tried Trileptal so we agreed to use that one.  Benefits, risks, side effects were discussed and she accepts. Start Trileptal 300 mg 1 nightly for 4 days then 1 twice daily. Continue Vraylar 3 mg daily. Continue Xanax 0.5 mg twice daily as needed. Continue Wellbutrin XL 300 mg every morning. Continue BuSpar 15 mg 1 twice daily. We may need to consider adding back in gabapentin or even trying propranolol for the restless legs if it does not improve with Trileptal, which is a possibility.  In the meantime she can use the Xanax for that problem as well since it is helping. Return in 4 to 6 weeks.  Donnal Moat, PA-C

## 2019-02-18 ENCOUNTER — Ambulatory Visit (INDEPENDENT_AMBULATORY_CARE_PROVIDER_SITE_OTHER): Payer: Medicare Other | Admitting: *Deleted

## 2019-02-18 DIAGNOSIS — J309 Allergic rhinitis, unspecified: Secondary | ICD-10-CM

## 2019-02-20 DIAGNOSIS — J301 Allergic rhinitis due to pollen: Secondary | ICD-10-CM

## 2019-02-24 DIAGNOSIS — J3089 Other allergic rhinitis: Secondary | ICD-10-CM

## 2019-02-25 DIAGNOSIS — J3081 Allergic rhinitis due to animal (cat) (dog) hair and dander: Secondary | ICD-10-CM | POA: Diagnosis not present

## 2019-02-27 DIAGNOSIS — F4310 Post-traumatic stress disorder, unspecified: Secondary | ICD-10-CM | POA: Diagnosis not present

## 2019-03-02 DIAGNOSIS — Z20828 Contact with and (suspected) exposure to other viral communicable diseases: Secondary | ICD-10-CM | POA: Diagnosis not present

## 2019-03-03 ENCOUNTER — Ambulatory Visit (INDEPENDENT_AMBULATORY_CARE_PROVIDER_SITE_OTHER): Payer: Medicare Other

## 2019-03-03 DIAGNOSIS — J309 Allergic rhinitis, unspecified: Secondary | ICD-10-CM | POA: Diagnosis not present

## 2019-03-18 ENCOUNTER — Ambulatory Visit: Payer: Medicare Other | Admitting: Physician Assistant

## 2019-03-20 ENCOUNTER — Encounter: Payer: Self-pay | Admitting: Allergy and Immunology

## 2019-03-24 ENCOUNTER — Ambulatory Visit (INDEPENDENT_AMBULATORY_CARE_PROVIDER_SITE_OTHER): Payer: Medicare Other | Admitting: *Deleted

## 2019-03-24 DIAGNOSIS — J309 Allergic rhinitis, unspecified: Secondary | ICD-10-CM | POA: Diagnosis not present

## 2019-03-27 DIAGNOSIS — F4310 Post-traumatic stress disorder, unspecified: Secondary | ICD-10-CM | POA: Diagnosis not present

## 2019-04-07 ENCOUNTER — Ambulatory Visit (INDEPENDENT_AMBULATORY_CARE_PROVIDER_SITE_OTHER): Payer: Medicare Other | Admitting: *Deleted

## 2019-04-07 DIAGNOSIS — J309 Allergic rhinitis, unspecified: Secondary | ICD-10-CM | POA: Diagnosis not present

## 2019-04-14 ENCOUNTER — Encounter: Payer: Self-pay | Admitting: Allergy and Immunology

## 2019-04-14 ENCOUNTER — Other Ambulatory Visit: Payer: Self-pay

## 2019-04-14 ENCOUNTER — Ambulatory Visit (INDEPENDENT_AMBULATORY_CARE_PROVIDER_SITE_OTHER): Payer: Medicare Other | Admitting: Allergy and Immunology

## 2019-04-14 VITALS — BP 94/70 | HR 96 | Temp 97.6°F | Resp 16 | Ht 68.0 in | Wt 125.6 lb

## 2019-04-14 DIAGNOSIS — Z91018 Allergy to other foods: Secondary | ICD-10-CM

## 2019-04-14 DIAGNOSIS — J3089 Other allergic rhinitis: Secondary | ICD-10-CM | POA: Diagnosis not present

## 2019-04-14 DIAGNOSIS — J4520 Mild intermittent asthma, uncomplicated: Secondary | ICD-10-CM | POA: Diagnosis not present

## 2019-04-14 MED ORDER — ALBUTEROL SULFATE HFA 108 (90 BASE) MCG/ACT IN AERS: 2.0000 | INHALATION_SPRAY | Freq: Four times a day (QID) | RESPIRATORY_TRACT | 1 refills | Status: DC | PRN

## 2019-04-14 MED ORDER — EPINEPHRINE 0.3 MG/0.3ML IJ SOAJ: 0.3000 mg | INTRAMUSCULAR | 3 refills | Status: DC | PRN

## 2019-04-14 NOTE — Patient Instructions (Signed)
  1.  Continue allergen avoidance measures  2.  Continue immunotherapy  3.  Continue the following if needed:   A.  Xyzal 5 mg - 1 tablet 1 time per day  B.  EpiPen  4.  Obtain Covid vaccine when available  5.  Return to clinic in 1 year or earlier if problem

## 2019-04-14 NOTE — Progress Notes (Signed)
[facility]   Follow-up Note  Referring Provider: Bartholome Bill, MD Primary Provider: Bartholome Bill, MD Date of Office Visit: 04/14/2019  Subjective:   Candace Browning (DOB: 01-Jan-1981) is a 39 y.o. female who returns to the [facility] on 04/14/2019 in re-evaluation of the following:  HPI: [name] returns to this clinic in evaluation of asthma and allergic rhinitis and allergic conjunctivitis and history of food allergy directed against peanuts and tree nuts.  Her last visit in this clinic was with our nurse practitioner on 12 April 2018.  She continues to receive immunotherapy currently at every 2 weeks without any adverse effect.  Immunotherapy has really resulted in very good control of her atopic respiratory and conjunctival disease and she does not really use any controller agents at this point.  Her requirement for short acting bronchodilator is approximately 3 times per year.  She has not required a systemic steroid or an antibiotic for any type of airway issue.  She continues to remain away from peanut and tree nut consumption and as well is gluten-free.  She refuses to receive the flu vaccine but she will be receiving the Covid vaccine at some point in the future.  Allergies as of 04/14/2019      Reactions   Apple Anaphylaxis   Coconut Oil    Gluten Meal    All breads   Latex    Nsaids Other (See Comments)   Thin basement membrane disease, advised to avoid NSAIDS   Other    PT HAD FOOD ALLERGIES:APPLES, WHEAT, AND all nuts   Peanut-containing Drug Products    Sulfa Antibiotics    Hydrogen Peroxide Itching, Rash   Penicillins Rash   Has patient had a PCN reaction causing immediate rash, facial/tongue/throat swelling, SOB or lightheadedness with hypotension: yes  Has patient had a PCN reaction causing severe rash involving mucus membranes or skin necrosis: no Has patient had a PCN reaction  that required hospitalization: no  Has patient had a PCN reaction occurring within the last 10 years: no If all of the above answers are "NO", then may proceed with Cephalosporin use.   Wheat Bran Rash      Medication List    albuterol 108 (90 Base) MCG/ACT inhaler Commonly known as: VENTOLIN HFA Inhale 2 puffs into the lungs every 6 (six) hours as needed.   ALPRAZolam 0.5 MG tablet Commonly known as: Xanax Take 1 tablet (0.5 mg total) by mouth 2 (two) times daily as needed for anxiety.   buPROPion 300 MG 24 hr tablet Commonly known as: WELLBUTRIN XL Take 1 tablet (300 mg total) by mouth daily.   busPIRone 15 MG tablet Commonly known as: BUSPAR Take 1 tablet (15 mg total) by mouth 2 (two) times daily.   cariprazine capsule Commonly known as: Vraylar Take 1 capsule (3 mg total) by mouth daily.   cholecalciferol 25 MCG (1000 UNIT) tablet Commonly known as: VITAMIN D3 Take 1,000 Units by mouth daily.   EPINEPHrine 0.3 mg/0.3 mL Soaj injection Commonly known as: EPI-PEN Inject 0.3 mg into the muscle as needed.   levocetirizine 5 MG tablet Commonly known as: XYZAL Take 5 mg by mouth every evening.   Oxcarbazepine 300 MG tablet Commonly known as: Trileptal 1 po qhs for 4 nights, then 1 po bid.   valACYclovir 500 MG tablet Commonly known as: VALTREX Take 1 tablet (500 mg total) by mouth 2 (two) times daily  as needed (outbreaks).       Past Medical History:  Diagnosis Date  . Allergic rhinitis   . Anemia   . Anxiety   . Asthma   . Atopic dermatitis   . Bipolar 1 disorder ([place])   . Eczema   . Food allergy   . Herpes simplex without mention of complication    HSV2  . Renal disorder     Past Surgical History:  Procedure Laterality Date  . WISDOM TOOTH EXTRACTION      Review of systems negative except as noted in HPI / PMHx or noted below:  Review of Systems  Constitutional: Negative.   HENT: Negative.   Eyes: Negative.   Respiratory: Negative.    Cardiovascular: Negative.   Gastrointestinal: Negative.   Genitourinary: Negative.   Musculoskeletal: Negative.   Skin: Negative.   Neurological: Negative.   Endo/Heme/Allergies: Negative.   Psychiatric/Behavioral: Negative.      Objective:   Vitals:   04/14/19 0940  BP: 94/70  Pulse: 96  Resp: 16  Temp: 97.6 F (36.4 C)  SpO2: 96%   Height: 5\' 8"  (172.7 cm)  Weight: 125 lb 9.6 oz (57 kg)   Physical Exam Constitutional:      Appearance: She is not diaphoretic.  HENT:     Head: Normocephalic.     Right Ear: Tympanic membrane, ear canal and external ear normal.     Left Ear: Tympanic membrane, ear canal and external ear normal.     Nose: Nose normal. No mucosal edema or rhinorrhea.     Mouth/Throat:     Pharynx: Uvula midline. No oropharyngeal exudate.  Eyes:     Conjunctiva/sclera: Conjunctivae normal.  Neck:     Thyroid: No thyromegaly.     Trachea: Trachea normal. No tracheal tenderness or tracheal deviation.  Cardiovascular:     Rate and Rhythm: Normal rate and regular rhythm.     Heart sounds: Normal heart sounds, S1 normal and S2 normal. No murmur.  Pulmonary:     Effort: No respiratory distress.     Breath sounds: Normal breath sounds. No stridor. No wheezing or rales.  Lymphadenopathy:     Head:     Right side of head: No tonsillar adenopathy.     Left side of head: No tonsillar adenopathy.     Cervical: No cervical adenopathy.  Skin:    Findings: No erythema or rash.     Nails: There is no clubbing.  Neurological:     Mental Status: She is alert.     Diagnostics:    Spirometry was performed and demonstrated an FEV1 of 2.79 at 94 % of predicted.  The patient had an Asthma Control Test with the following results:  .    Assessment and Plan:   1. Asthma, mild intermittent, well-controlled   2. Other allergic rhinitis   3. Food allergy     1.  Continue allergen avoidance measures  2.  Continue immunotherapy  3.  Continue the following if  needed:   A.  Xyzal 5 mg - 1 tablet 1 time per day  B.  EpiPen  4.  Obtain Covid vaccine when available  5.  Return to clinic in 1 year or earlier if problem  [name] is really doing quite well with her atopic disease while using a course of immunotherapy.  She will continue on this form of therapy and I will see her back in this clinic in 1 year.  She will continue to avoid  consumption of peanut and tree nut and she has several medications that she can utilize should they be required.  Allena Katz, MD Allergy / [facility]

## 2019-04-15 ENCOUNTER — Encounter: Payer: Self-pay | Admitting: Allergy and Immunology

## 2019-04-22 ENCOUNTER — Other Ambulatory Visit: Payer: Self-pay

## 2019-04-22 ENCOUNTER — Encounter: Payer: Self-pay | Admitting: Physician Assistant

## 2019-04-22 ENCOUNTER — Ambulatory Visit (INDEPENDENT_AMBULATORY_CARE_PROVIDER_SITE_OTHER): Payer: Medicare Other | Admitting: Physician Assistant

## 2019-04-22 DIAGNOSIS — F319 Bipolar disorder, unspecified: Secondary | ICD-10-CM

## 2019-04-22 DIAGNOSIS — G259 Extrapyramidal and movement disorder, unspecified: Secondary | ICD-10-CM | POA: Diagnosis not present

## 2019-04-22 DIAGNOSIS — F411 Generalized anxiety disorder: Secondary | ICD-10-CM

## 2019-04-22 MED ORDER — ALPRAZOLAM 0.5 MG PO TABS: 0.5000 mg | ORAL_TABLET | Freq: Two times a day (BID) | ORAL | 1 refills | Status: DC | PRN

## 2019-04-22 MED ORDER — CARIPRAZINE HCL 3 MG PO CAPS: 3.0000 mg | ORAL_CAPSULE | Freq: Every day | ORAL | 2 refills | Status: DC

## 2019-04-22 NOTE — Progress Notes (Signed)
[facility]  Patient ID: Candace Browning,  MRN: [id number]  PCP: Bartholome Bill, MD  Date of Evaluation: 04/22/2019 Time spent:20 minutes  Chief Complaint:  Chief Complaint    Follow-up      HISTORY/CURRENT STATUS: HPI for routine med check.  Candace Browning reports clenching her jaws at times.  "I am not grinding my teeth I just clench them.  The muscles get tight when I realize him doing it."  This is been happening off and on for a month or so.  Also states that she now has a "line" that has formed above her upper lip because of the pursing.  That has gotten a lot better but she does notice it from time to time.  "I am too young to have a line there.  I know the older will get we get wrinkles, but I do want one at my age."  Other movements she is noticed is a fine tremor in her legs bilaterally.  It is rare to have this but it does happen occasionally.  Also states that when she lays down at night, more often than not over the past month or so, she has felt anxious all over, inside, like she cannot get away from that feeling.  It does not happen at any other time.  On a couple of occasions, she has taken a Xanax after she has been in bed for a while and is feeling miserable.  It does help but it is taking a while to take effect.  At the last visit because she was exhibiting some hypomanic behaviors, we added Trileptal.  She completely forgot all about it, got the prescription and put it in a drawer and never took it.  She has had no further symptoms such as increased energy with decreased need for sleep.  No impulsivity or risky behavior, no increased libido or spending.  No grandiosity.  No hallucinations.  Once she gets to sleep, she is able to stay asleep and she feels rested the next day.  At least most of the time.  Patient denies loss of interest in usual activities and is able to enjoy things.  Denies decreased energy or motivation.  Appetite has not changed.  No extreme  sadness, tearfulness, or feelings of hopelessness.  Denies any changes in concentration, making decisions or remembering things.  Denies suicidal or homicidal thoughts.  She likes the Vraylar and the medications that she is currently on.  "If I have to live with the line on my face then it is a small price to pay for the way I feel."  Denies dizziness, syncope, seizures, numbness, tingling, unsteady gait, slurred speech, confusion. Denies muscle or joint pain, stiffness, or dystonia.  Individual Medical History/ Review of Systems: Changes? :No    Past medications for mental health diagnoses include: BuSpar, Xanax, Latuda, Depakote, Vistaril, Lamictal, Valium, Seroquel, Abilify, Risperdal, lithium, Equetro, Geodon, prazosin,doxazosin, Luvox, Zoloft, Vraylar, Wellbutrin, Rexulti, Zyprexa caused confusion and stuttering, Gabapentin unsure if it helped or not.   Allergies: Apple, Coconut oil, Gluten meal, Latex, Nsaids, Other, Peanut-containing drug products, Sulfa antibiotics, Hydrogen peroxide, Penicillins, and Wheat bran  Current Medications:  Current Outpatient Medications:  .  albuterol (VENTOLIN HFA) 108 (90 Base) MCG/ACT inhaler, Inhale 2 puffs into the lungs every 6 (six) hours as needed., Disp: 18 g, Rfl: 1 .  ALPRAZolam (XANAX) 0.5 MG tablet, Take 1 tablet (0.5 mg total) by mouth 2 (two) times daily as needed for anxiety., Disp: 60 tablet,  Rfl: 1 .  buPROPion (WELLBUTRIN XL) 300 MG 24 hr tablet, Take 1 tablet (300 mg total) by mouth daily., Disp: 90 tablet, Rfl: 1 .  busPIRone (BUSPAR) 15 MG tablet, Take 1 tablet (15 mg total) by mouth 2 (two) times daily., Disp: 180 tablet, Rfl: 3 .  cariprazine (VRAYLAR) capsule, Take 1 capsule (3 mg total) by mouth daily., Disp: 30 capsule, Rfl: 2 .  EPINEPHrine 0.3 mg/0.3 mL IJ SOAJ injection, Inject 0.3 mLs (0.3 mg total) into the muscle as needed., Disp: 2 each, Rfl: 3 .  valACYclovir (VALTREX) 500 MG tablet, Take 1 tablet (500 mg total) by mouth 2  (two) times daily as needed (outbreaks)., Disp: 30 tablet, Rfl: 0 .  cholecalciferol (VITAMIN D3) 25 MCG (1000 UT) tablet, Take 1,000 Units by mouth daily. , Disp: , Rfl:  .  levocetirizine (XYZAL) 5 MG tablet, Take 5 mg by mouth every evening., Disp: , Rfl:  Medication Side Effects: none  Family Medical/ Social History: Changes? No  MENTAL HEALTH EXAM:  There were no vitals taken for this visit.There is no height or weight on file to calculate BMI.  General Appearance: Casual, Neat and Well Groomed  Eye Contact:  Good  Speech:  Clear and Coherent  Volume:  Normal  Mood:  Euthymic  Affect:  Appropriate  Thought Process:  Goal Directed and Descriptions of Associations: Intact  Orientation:  Full (Time, Place, and Person)  Thought Content: Logical   Suicidal Thoughts:  No  Homicidal Thoughts:  No  Memory:  WNL  Judgement:  Good  Insight:  Good  Psychomotor Activity:  Rare pursing of her upper lip  Concentration:  Concentration: Good and Attention Span: Good  Recall:  Good  Fund of Knowledge: Good  Language: Good  Assets:  Desire for Improvement  ADL's:  Intact  Cognition: WNL  Prognosis:  Good    DIAGNOSES:    ICD-10-CM   1. Bipolar I disorder ([place])  F31.9   2. Generalized anxiety disorder  F41.1   3. Extrapyramidal and movement disorder, unspecified  G25.9     Receiving Psychotherapy: No    RECOMMENDATIONS:  I spent 20 minutes with her. PDMP was reviewed. We discussed the fact that Xanax or any benzodiazepine will often help the symptoms that she is having.  So I recommend that she continue that.  She should take 1 about an hour before she wants to go to bed though, and hopefully that will help with that restlessness that she is feeling.  She understands and will try it.  If for some reason the Xanax is not helpful, then I would recommend trying Valium.  It is also a good muscle relaxer and might help to jaw clenching as well. Continue Xanax 0.5 mg twice daily as  needed. Continue Wellbutrin XL 300 mg 1 every morning. Continue BuSpar 15 mg 1 twice daily. Continue Vraylar 3 mg daily. Return in 4 to 6 weeks.   Donnal Moat, PA-C

## 2019-04-28 ENCOUNTER — Ambulatory Visit (INDEPENDENT_AMBULATORY_CARE_PROVIDER_SITE_OTHER): Payer: Medicare Other | Admitting: *Deleted

## 2019-04-28 DIAGNOSIS — J309 Allergic rhinitis, unspecified: Secondary | ICD-10-CM | POA: Diagnosis not present

## 2019-05-09 DIAGNOSIS — F4310 Post-traumatic stress disorder, unspecified: Secondary | ICD-10-CM | POA: Diagnosis not present

## 2019-05-22 ENCOUNTER — Other Ambulatory Visit: Payer: Self-pay

## 2019-05-22 ENCOUNTER — Ambulatory Visit (INDEPENDENT_AMBULATORY_CARE_PROVIDER_SITE_OTHER): Payer: Medicare Other | Admitting: Physician Assistant

## 2019-05-22 ENCOUNTER — Encounter: Payer: Self-pay | Admitting: Physician Assistant

## 2019-05-22 DIAGNOSIS — G4700 Insomnia, unspecified: Secondary | ICD-10-CM | POA: Diagnosis not present

## 2019-05-22 DIAGNOSIS — F3162 Bipolar disorder, current episode mixed, moderate: Secondary | ICD-10-CM | POA: Diagnosis not present

## 2019-05-22 DIAGNOSIS — F411 Generalized anxiety disorder: Secondary | ICD-10-CM | POA: Diagnosis not present

## 2019-05-22 MED ORDER — VRAYLAR 4.5 MG PO CAPS: 4.5000 mg | ORAL_CAPSULE | Freq: Every day | ORAL | 1 refills | Status: DC

## 2019-05-22 MED ORDER — ALPRAZOLAM 0.5 MG PO TABS: 0.5000 mg | ORAL_TABLET | Freq: Two times a day (BID) | ORAL | 5 refills | Status: DC | PRN

## 2019-05-22 MED ORDER — BUPROPION HCL ER (XL) 300 MG PO TB24: 300.0000 mg | ORAL_TABLET | Freq: Every day | ORAL | 1 refills | Status: DC

## 2019-05-22 MED ORDER — TRIHEXYPHENIDYL HCL 2 MG PO TABS: ORAL_TABLET | ORAL | 1 refills | Status: DC

## 2019-05-22 NOTE — Progress Notes (Signed)
[facility]  Patient ID: Candace Browning,  MRN: [id number]  PCP: Bartholome Bill, MD  Date of Evaluation: 05/22/2019 Time spent:30 minutes  Chief Complaint:  Chief Complaint    Anxiety; Depression      HISTORY/CURRENT STATUS: HPI for 6-week med check.    Over the past 5 to 6 weeks, she has had several ups and downs.  Initially, she was "up" meaning happier, not needing as much sleep, more energetic and was a bit more impulsive.  There was no increased spending, libido or risky behaviors.  No hallucinations.    Then after approximately a week or so she "crashed" becoming really depressed.  Was really lethargic, did not want to do anything, felt "blah."  Had no suicidal or homicidal thoughts.  That lasted about a week.  Now she is more anxious.  She has this internal sense of unease like something is about to happen and that he would not want to have happen.  No panic attacks.  She does not take the Xanax often but it is effective when she needs it.  She is sleeping better now, partly because she does use the Xanax to help her mind slow down in order to go to sleep.  Denies dizziness, syncope, seizures, numbness, tingling, tremor, tics, unsteady gait, slurred speech, confusion. Denies muscle or joint pain, stiffness, or dystonia.  Individual Medical History/ Review of Systems: Changes? :No    Past medications for mental health diagnoses include: BuSpar, Xanax, Latuda, Depakote did not work at all,  Vistaril, Lamictal, Valium, Seroquel, Abilify, Risperdal, lithium, Equetro, Geodon, prazosin,doxazosin, Luvox, Zoloft, Vraylar, Wellbutrin, Rexulti, Zyprexa caused confusion and stuttering, Gabapentin unsure if it helped or not,   Allergies: Apple, Coconut oil, Gluten meal, Latex, Nsaids, Other, Peanut-containing drug products, Sulfa antibiotics, Hydrogen peroxide, Penicillins, and Wheat bran  Current Medications:  Current Outpatient Medications:  .  albuterol (VENTOLIN  HFA) 108 (90 Base) MCG/ACT inhaler, Inhale 2 puffs into the lungs every 6 (six) hours as needed., Disp: 18 g, Rfl: 1 .  ALPRAZolam (XANAX) 0.5 MG tablet, Take 1 tablet (0.5 mg total) by mouth 2 (two) times daily as needed for anxiety., Disp: 60 tablet, Rfl: 5 .  buPROPion (WELLBUTRIN XL) 300 MG 24 hr tablet, Take 1 tablet (300 mg total) by mouth daily., Disp: 90 tablet, Rfl: 1 .  busPIRone (BUSPAR) 15 MG tablet, Take 1 tablet (15 mg total) by mouth 2 (two) times daily., Disp: 180 tablet, Rfl: 3 .  EPINEPHrine 0.3 mg/0.3 mL IJ SOAJ injection, Inject 0.3 mLs (0.3 mg total) into the muscle as needed., Disp: 2 each, Rfl: 3 .  levocetirizine (XYZAL) 5 MG tablet, Take 5 mg by mouth every evening., Disp: , Rfl:  .  valACYclovir (VALTREX) 500 MG tablet, Take 1 tablet (500 mg total) by mouth 2 (two) times daily as needed (outbreaks)., Disp: 30 tablet, Rfl: 0 .  Cariprazine HCl (VRAYLAR) 4.5 MG CAPS, Take 1 capsule (4.5 mg total) by mouth daily., Disp: 30 capsule, Rfl: 1 .  Cariprazine HCl (VRAYLAR) 4.5 MG CAPS, Take 1 capsule (4.5 mg total) by mouth daily., Disp: 30 capsule, Rfl: 1 .  cholecalciferol (VITAMIN D3) 25 MCG (1000 UT) tablet, Take 1,000 Units by mouth daily. , Disp: , Rfl:  .  trihexyphenidyl (ARTANE) 2 MG tablet, 1 po qhs for at least 4 days, then may increase to bid after that., Disp: 60 tablet, Rfl: 1 Medication Side Effects: none  Family Medical/ Social History: Changes? No  MENTAL  HEALTH EXAM:  There were no vitals taken for this visit.There is no height or weight on file to calculate BMI.  General Appearance: Casual, Neat and Well Groomed  Eye Contact:  Good  Speech:  Clear and Coherent and Normal Rate  Volume:  Normal  Mood:  Euthymic  Affect:  Appropriate  Thought Process:  Goal Directed and Descriptions of Associations: Intact  Orientation:  Full (Time, Place, and Person)  Thought Content: Logical   Suicidal Thoughts:  No  Homicidal Thoughts:  No  Memory:  WNL  Judgement:   Good  Insight:  Good  Psychomotor Activity:  Normal  Concentration:  Concentration: Good  Recall:  Good  Fund of Knowledge: Good  Language: Good  Assets:  Desire for Improvement  ADL's:  Intact  Cognition: WNL  Prognosis:  Good    DIAGNOSES:    ICD-10-CM   1. Bipolar 1 disorder, mixed, moderate ([facility])  F31.62   2. Generalized anxiety disorder  F41.1   3. Insomnia, unspecified type  G47.00     Receiving Psychotherapy: Yes  Lilia Argue    RECOMMENDATIONS:  PDMP was reviewed. I spent 30 minutes with her. We discussed the different treatment options and she has done very well overall on the Vraylar.  We increase the dose last fall and she had akathisia with the 4.5 mg but that resolved after decreasing the dose back to 3 mg.  After reviewing her records, I see that we had added Artane at 1 point but she was under a different stressful situation, she and her husband were moving, and she only took Artane for a few days.  It was stopped because it made her feel funny. "off" see 11/11/2018 progress note. I would recommend that we increase the Vraylar back to 4.5 mg and go ahead and restart the Artane.  Her circumstances are very different now so hopefully she will not have any side effects either to the Vraylar at the higher dose or with the Artane.  [place] would like to try this.  Increase Vraylar to 4.5 mg daily. Continue Xanax 0.5 mg 1 p.o. twice daily. Continue BuSpar 15 mg, 1 p.o. twice daily. Restart Artane 2 mg 1 nightly for 4 days and then may increase to twice daily. Return in 4 to 6 weeks.   Donnal Moat, PA-C

## 2019-05-26 ENCOUNTER — Ambulatory Visit (INDEPENDENT_AMBULATORY_CARE_PROVIDER_SITE_OTHER): Payer: Medicare Other | Admitting: *Deleted

## 2019-05-26 DIAGNOSIS — J309 Allergic rhinitis, unspecified: Secondary | ICD-10-CM | POA: Diagnosis not present

## 2019-06-02 ENCOUNTER — Ambulatory Visit (INDEPENDENT_AMBULATORY_CARE_PROVIDER_SITE_OTHER): Payer: Medicare Other | Admitting: *Deleted

## 2019-06-02 DIAGNOSIS — J309 Allergic rhinitis, unspecified: Secondary | ICD-10-CM

## 2019-06-09 ENCOUNTER — Ambulatory Visit (INDEPENDENT_AMBULATORY_CARE_PROVIDER_SITE_OTHER): Payer: Medicare Other | Admitting: *Deleted

## 2019-06-09 DIAGNOSIS — F4310 Post-traumatic stress disorder, unspecified: Secondary | ICD-10-CM | POA: Diagnosis not present

## 2019-06-09 DIAGNOSIS — J309 Allergic rhinitis, unspecified: Secondary | ICD-10-CM | POA: Diagnosis not present

## 2019-06-16 ENCOUNTER — Ambulatory Visit (INDEPENDENT_AMBULATORY_CARE_PROVIDER_SITE_OTHER): Payer: Medicare Other | Admitting: *Deleted

## 2019-06-16 DIAGNOSIS — J309 Allergic rhinitis, unspecified: Secondary | ICD-10-CM | POA: Diagnosis not present

## 2019-06-17 DIAGNOSIS — Z0000 Encounter for general adult medical examination without abnormal findings: Secondary | ICD-10-CM | POA: Diagnosis not present

## 2019-06-17 DIAGNOSIS — R799 Abnormal finding of blood chemistry, unspecified: Secondary | ICD-10-CM | POA: Diagnosis not present

## 2019-06-17 DIAGNOSIS — Z13220 Encounter for screening for lipoid disorders: Secondary | ICD-10-CM | POA: Diagnosis not present

## 2019-06-17 DIAGNOSIS — F3161 Bipolar disorder, current episode mixed, mild: Secondary | ICD-10-CM | POA: Diagnosis not present

## 2019-06-17 DIAGNOSIS — F603 Borderline personality disorder: Secondary | ICD-10-CM | POA: Diagnosis not present

## 2019-06-17 DIAGNOSIS — Z79899 Other long term (current) drug therapy: Secondary | ICD-10-CM | POA: Diagnosis not present

## 2019-06-17 DIAGNOSIS — J4530 Mild persistent asthma, uncomplicated: Secondary | ICD-10-CM | POA: Diagnosis not present

## 2019-06-19 ENCOUNTER — Encounter: Payer: Self-pay | Admitting: Physician Assistant

## 2019-06-19 ENCOUNTER — Ambulatory Visit (INDEPENDENT_AMBULATORY_CARE_PROVIDER_SITE_OTHER): Payer: Medicare Other | Admitting: Physician Assistant

## 2019-06-19 ENCOUNTER — Other Ambulatory Visit: Payer: Self-pay

## 2019-06-19 DIAGNOSIS — G259 Extrapyramidal and movement disorder, unspecified: Secondary | ICD-10-CM

## 2019-06-19 DIAGNOSIS — F411 Generalized anxiety disorder: Secondary | ICD-10-CM

## 2019-06-19 DIAGNOSIS — F3162 Bipolar disorder, current episode mixed, moderate: Secondary | ICD-10-CM

## 2019-06-19 DIAGNOSIS — G4700 Insomnia, unspecified: Secondary | ICD-10-CM | POA: Diagnosis not present

## 2019-06-19 MED ORDER — OXCARBAZEPINE 300 MG PO TABS: ORAL_TABLET | ORAL | 1 refills | Status: DC

## 2019-06-19 MED ORDER — CARIPRAZINE HCL 3 MG PO CAPS: 3.0000 mg | ORAL_CAPSULE | Freq: Every day | ORAL | 1 refills | Status: DC

## 2019-06-19 NOTE — Progress Notes (Signed)
[facility]  Patient ID: Candace Browning,  MRN: [id number]  PCP: Bartholome Bill, MD  Date of Evaluation: 06/19/2019 Time spent:30 minutes  Chief Complaint:  Chief Complaint    Medication Problem      HISTORY/CURRENT STATUS: HPI For routine med check.   Four weeks ago, we increased the Vraylar d/t manic sx. Has responded well in that regard, however, the abnormal lip and mouth movements worsened again, and she also noticed jaw clenching and neck movements to the point that her head will move abruptly and uncontrollably. Jaw muscles are tight and painful most of the time. Artane did not help at all.   Mood is stable though.  Kind of at her baseline and is staying there unless there's a reason for it to go up or down.  Patient denies loss of interest in usual activities and is able to enjoy things.  Denies decreased energy or motivation.  Appetite has not changed.  No extreme sadness, tearfulness, or feelings of hopelessness.  Denies any changes in concentration, making decisions or remembering things.  Denies suicidal or homicidal thoughts.  Patient denies increased energy with decreased need for sleep, no increased talkativeness, no racing thoughts, no impulsivity or risky behaviors, no increased spending, no increased libido, no grandiosity, no increased irritability or anger, and no hallucinations.  Anxiety is controlled most of the time. Xanax still helps when needed.  Sleeps pretty good.  Work at [employer] is good except she is very forgetful at times.   Individual Medical History/ Review of Systems: Changes? :No    Past medications for mental health diagnoses include: BuSpar, Xanax, Latuda, Depakote did not work at all,  Vistaril, Lamictal caused SI, Valium, Seroquel, Abilify, Risperdal, lithium, Equetro, Geodon, prazosin,doxazosin, Luvox, Zoloft, Vraylar has TD at higher doses. Wellbutrin, Rexulti, Zyprexa caused confusion and stuttering, Ingrezza wasn't  helpful. Gabapentin unsure if it helped or not. Artane no help.   Allergies: Apple, Coconut oil, Gluten meal, Latex, Nsaids, Other, Peanut-containing drug products, Sulfa antibiotics, Hydrogen peroxide, Penicillins, and Wheat bran  Current Medications:  Current Outpatient Medications:  .  albuterol (VENTOLIN HFA) 108 (90 Base) MCG/ACT inhaler, Inhale 2 puffs into the lungs every 6 (six) hours as needed., Disp: 18 g, Rfl: 1 .  ALPRAZolam (XANAX) 0.5 MG tablet, Take 1 tablet (0.5 mg total) by mouth 2 (two) times daily as needed for anxiety., Disp: 60 tablet, Rfl: 5 .  buPROPion (WELLBUTRIN XL) 300 MG 24 hr tablet, Take 1 tablet (300 mg total) by mouth daily., Disp: 90 tablet, Rfl: 1 .  busPIRone (BUSPAR) 15 MG tablet, Take 1 tablet (15 mg total) by mouth 2 (two) times daily., Disp: 180 tablet, Rfl: 3 .  EPINEPHrine 0.3 mg/0.3 mL IJ SOAJ injection, Inject 0.3 mLs (0.3 mg total) into the muscle as needed., Disp: 2 each, Rfl: 3 .  valACYclovir (VALTREX) 500 MG tablet, Take 1 tablet (500 mg total) by mouth 2 (two) times daily as needed (outbreaks)., Disp: 30 tablet, Rfl: 0 .  cariprazine (VRAYLAR) capsule, Take 1 capsule (3 mg total) by mouth daily., Disp: 30 capsule, Rfl: 1 .  cholecalciferol (VITAMIN D3) 25 MCG (1000 UT) tablet, Take 1,000 Units by mouth daily. , Disp: , Rfl:  .  levocetirizine (XYZAL) 5 MG tablet, Take 5 mg by mouth every evening., Disp: , Rfl:  .  Oxcarbazepine (TRILEPTAL) 300 MG tablet, 1 po qhs for 4 days, then 1 po bid, Disp: 60 tablet, Rfl: 1 Medication Side Effects: clenching jaws,  neck spasms, abnormal mouth movements  Family Medical/ Social History: Changes? No  MENTAL HEALTH EXAM:  There were no vitals taken for this visit.There is no height or weight on file to calculate BMI.  General Appearance: Casual, Neat and Well Groomed  Eye Contact:  Good  Speech:  Clear and Coherent  Volume:  Normal  Mood:  Euthymic  Affect:  Appropriate  Thought Process:  Goal Directed  and Descriptions of Associations: Intact  Orientation:  Full (Time, Place, and Person)  Thought Content: Logical   Suicidal Thoughts:  No  Homicidal Thoughts:  No  Memory:  Immediate;   Fair Recent;   Fair otherwise nl  Judgement:  Good  Insight:  Good  Psychomotor Activity:  TD  Concentration:  Concentration: Good  Recall:  Good  Fund of Knowledge: Good  Language: Good  Assets:  Desire for Improvement  ADL's:  Intact  Cognition: WNL  Prognosis:  Good    DIAGNOSES:    ICD-10-CM   1. Extrapyramidal and movement disorder, unspecified  G25.9   2. Bipolar 1 disorder, mixed, moderate ([place])  F31.62   3. Generalized anxiety disorder  F41.1   4. Insomnia, unspecified type  G47.00     Receiving Psychotherapy: Yes Lilia Argue   RECOMMENDATIONS:  PDMP reviewed. Spent 30 mins w/ her. Discussed the EPS/TD.  She has responded well to the Vraylar, more so than other AP and we agree to cont that, but decrease dose.  When at lower doses in the past, TD improves but sometimes the Bipolar moods are more pronounced depending on what's going on. She is well aware of the warning signs and will let me know if they begin to occur, either mania or depression. We can either increase Wellbutrin, Buspar, or Trileptal depending on the need. She understands and agrees.  She has been on Ingrezza in the past which didn't help so she prefers not to add [place] for now at least.    D/C Artane. Decrease Vraylar to 3 mg qd. Cont Xanax 0.5mg  bid prn. Continue Wellbutrin XL 300 mg, 1 qd. Continue Buspar 15 mg bid. Continue Trileptal 300 mg, 1 po bid.  Cont therapy. Return in 4 wks   Donnal Moat, [place]

## 2019-06-20 ENCOUNTER — Encounter: Payer: Self-pay | Admitting: Physician Assistant

## 2019-06-23 ENCOUNTER — Ambulatory Visit (INDEPENDENT_AMBULATORY_CARE_PROVIDER_SITE_OTHER): Payer: Medicare Other | Admitting: *Deleted

## 2019-06-23 DIAGNOSIS — J309 Allergic rhinitis, unspecified: Secondary | ICD-10-CM | POA: Diagnosis not present

## 2019-07-04 DIAGNOSIS — Z03818 Encounter for observation for suspected exposure to other biological agents ruled out: Secondary | ICD-10-CM | POA: Diagnosis not present

## 2019-07-04 DIAGNOSIS — Z20828 Contact with and (suspected) exposure to other viral communicable diseases: Secondary | ICD-10-CM | POA: Diagnosis not present

## 2019-07-15 DIAGNOSIS — Z20828 Contact with and (suspected) exposure to other viral communicable diseases: Secondary | ICD-10-CM | POA: Diagnosis not present

## 2019-07-15 DIAGNOSIS — Z03818 Encounter for observation for suspected exposure to other biological agents ruled out: Secondary | ICD-10-CM | POA: Diagnosis not present

## 2019-07-16 ENCOUNTER — Encounter: Payer: Self-pay | Admitting: Physician Assistant

## 2019-07-16 ENCOUNTER — Other Ambulatory Visit: Payer: Self-pay

## 2019-07-16 ENCOUNTER — Ambulatory Visit (INDEPENDENT_AMBULATORY_CARE_PROVIDER_SITE_OTHER): Payer: Medicare Other | Admitting: Physician Assistant

## 2019-07-16 DIAGNOSIS — F411 Generalized anxiety disorder: Secondary | ICD-10-CM | POA: Diagnosis not present

## 2019-07-16 DIAGNOSIS — F319 Bipolar disorder, unspecified: Secondary | ICD-10-CM | POA: Diagnosis not present

## 2019-07-16 IMAGING — CT CT NECK W/ CM
3 of 4 series · 13 of 33 positions shown, 16 images · IV contrast (APPLIED)
Comparison: 08/03/2013 CT of the cervical spine.
COMPARISON: 08/03/2013 CT of the cervical spine.

Addendum:
CLINICAL DATA: 37 y/o F; lesion of the gums in area of tooth number
30.

EXAM:
CT NECK WITH CONTRAST
TECHNIQUE: Multidetector CT imaging of the neck was performed using the
standard protocol following the bolus administration of intravenous
contrast.
CONTRAST:  75mL OMNIPAQUE IOHEXOL 300 MG/ML  SOLN

[Series 3: neck 2.0 i31s 3 · axial · 0.40mm/px · z∈[-253,-85]mm · 5 of 127 slices shown, 7 images]
[im 22/127  soft-tissue]
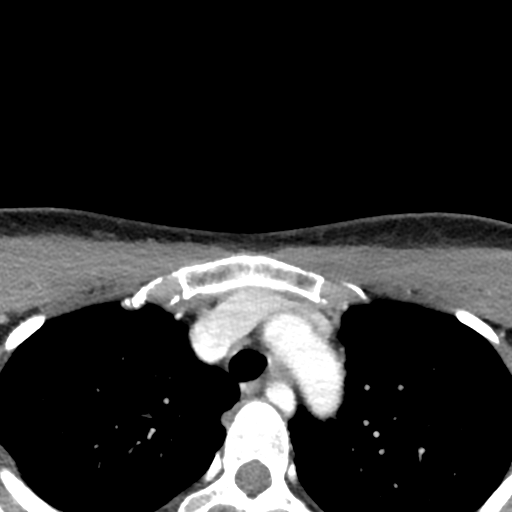
[im 22/127  bone]
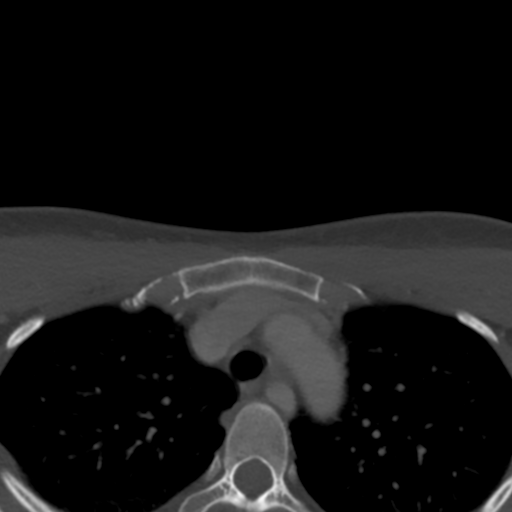
[im 43/127  bone]
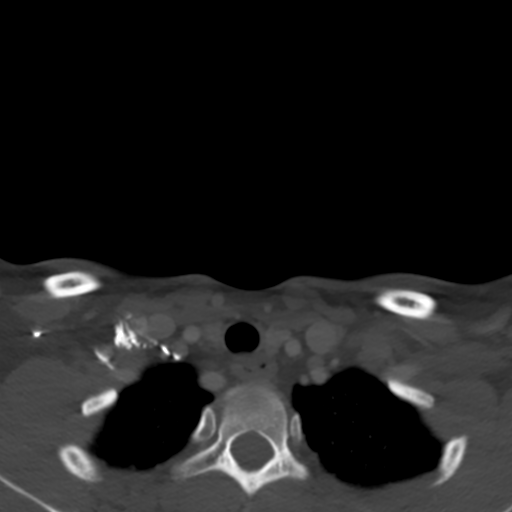
[im 64/127  bone]
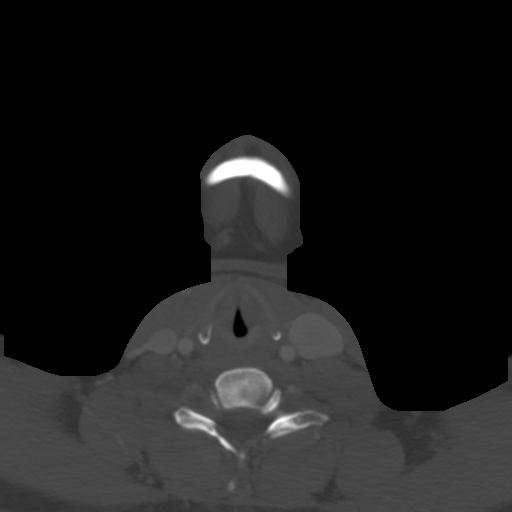
[im 85/127  bone]
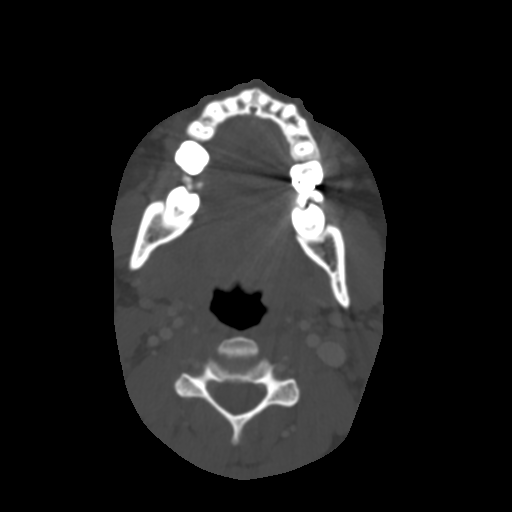
[im 106/127  soft-tissue]
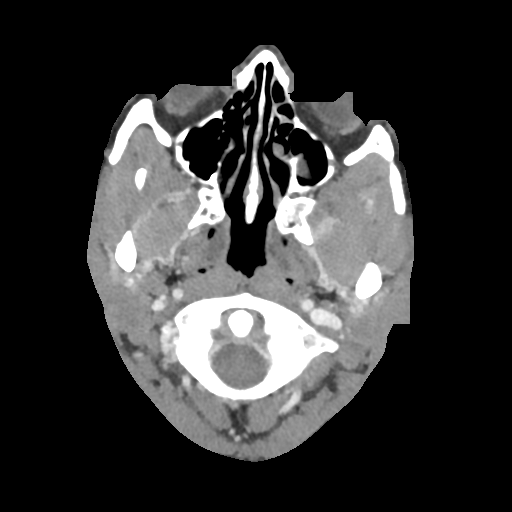
[im 106/127  bone]
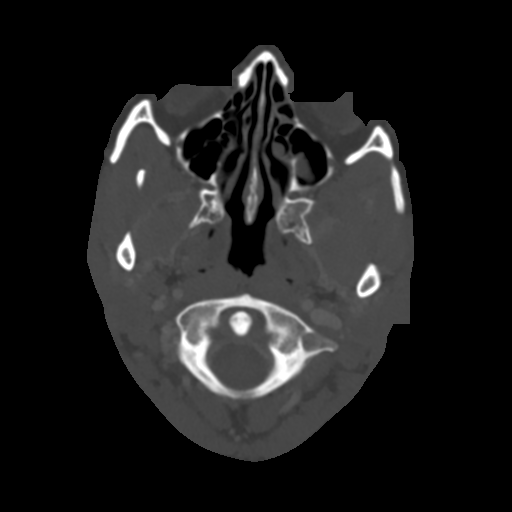

[Series 6: coronal st · coronal · 0.51mm/px · 3 of 101 slices shown]
[im 21/101  bone]
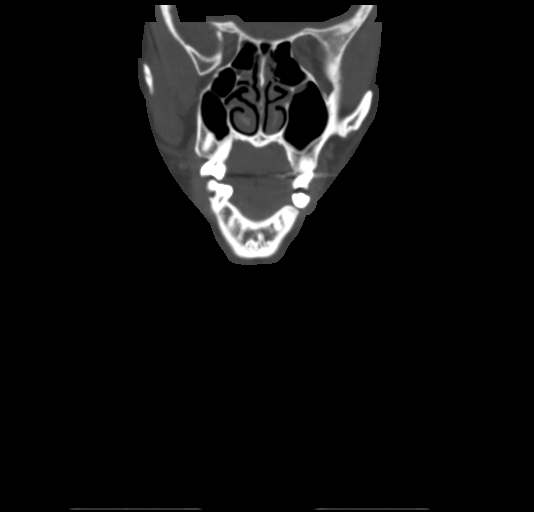
[im 41/101  bone]
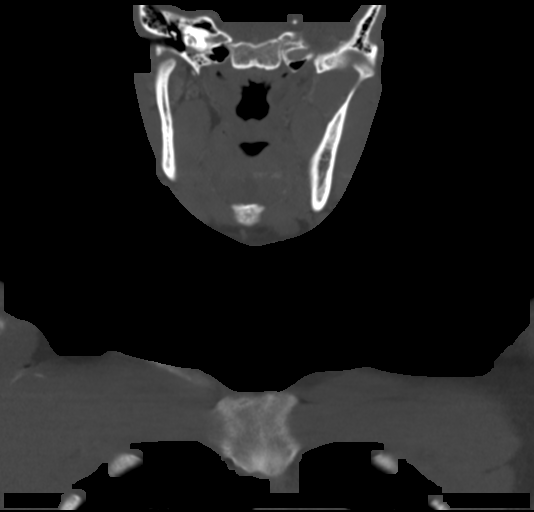
[im 61/101  bone]
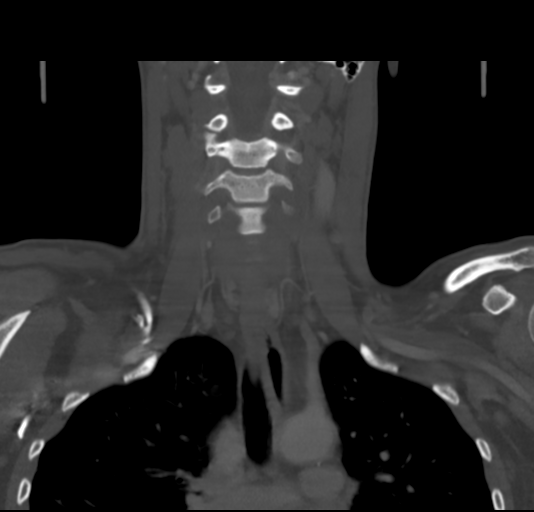

[Series 7: sagittal st · sagittal · 0.49mm/px · 5 of 101 slices shown, 6 images]
[im 34/101  bone]
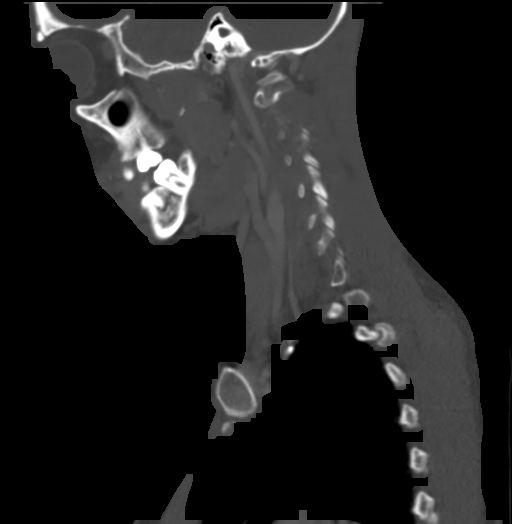
[im 42/101  bone]
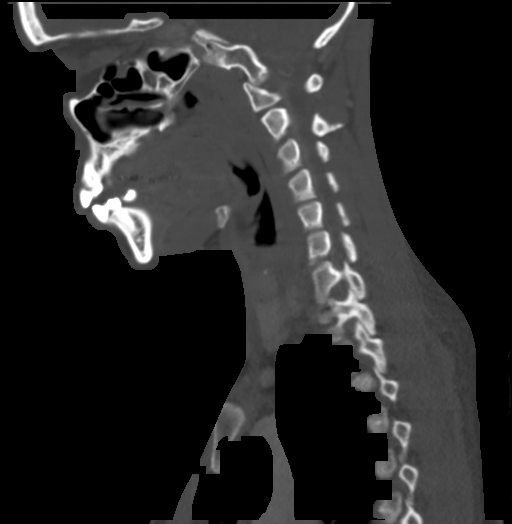
[im 51/101  soft-tissue]
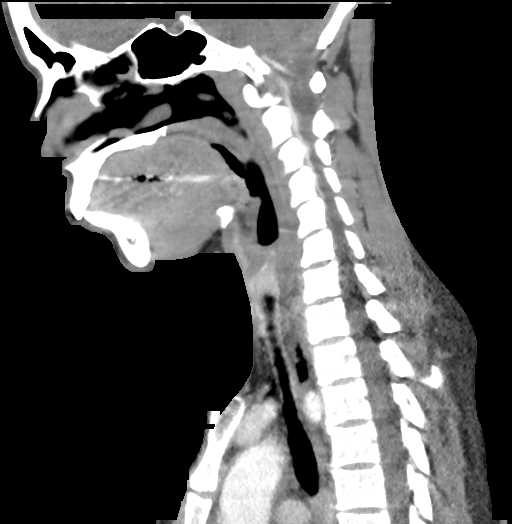
[im 51/101  bone]
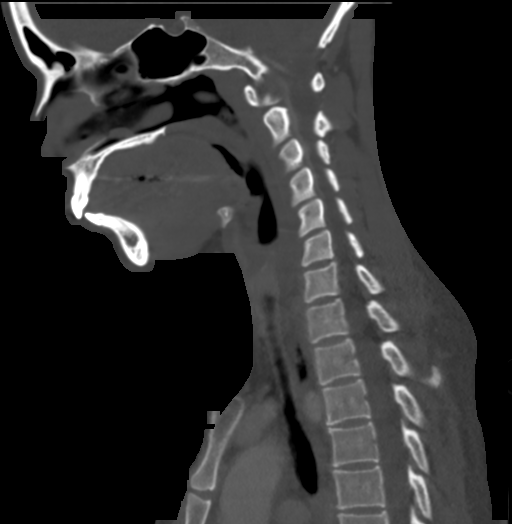
[im 59/101  bone]
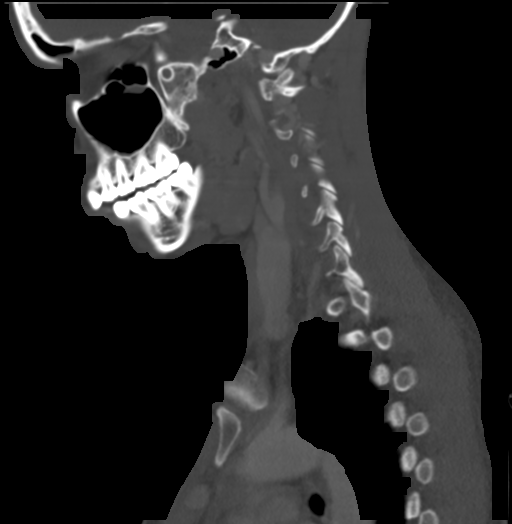
[im 67/101  bone]
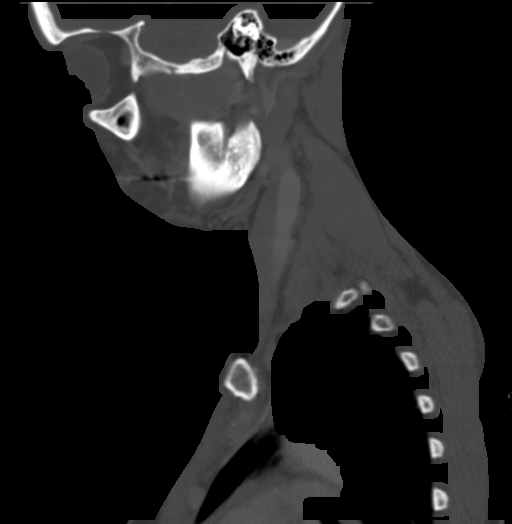

[13 of 33 positions shown; findings below may reference images not displayed]

FINDINGS: Pharynx and larynx: There is a linear 10 mm focus of mucosal
enhancement of right posterior mandibular buccal mucosa (series 3,
image 42) which probably corresponds to the lesion of interest.
There is no extra-mucosal extension into the overlying soft tissues
or adjacent osseous structures identify.

Salivary glands: No inflammation, mass, or stone.

Thyroid: Normal.

Lymph nodes: None enlarged or abnormal density.

Vascular: Negative.

Limited intracranial: Negative.

Visualized orbits: Negative.

Mastoids and visualized paranasal sinuses: Mild ethmoid and
maxillary sinus mucosal thickening. No sinus fluid levels. Normal
aeration of visible mastoid air cells.

Skeleton: No acute or aggressive process.

Upper chest: Negative.

Other: None.
IMPRESSION: 10 mm linear focus of mucosal enhancement of right posterior
mandibular buccal mucosa which probably corresponds to the lesion of
interest. No extra-mucosal extension into overlying soft tissues or
adjacent osseous structures identified. No lymphadenopathy by
imaging criteria.

ADDENDUM:
Right posterior mandibular buccal mucosa enhancement is nonspecific
and can be seen with aphthous stomatitis, oral cavity infection, or
inflammatory mucositis as well as neoplasm such as squamous cell
carcinoma and minor salivary gland tumor.

  By: Suhair Cirino M.D.

*** End of Addendum ***
Addendum:
FINDINGS: Pharynx and larynx: There is a linear 10 mm focus of mucosal
enhancement of right posterior mandibular buccal mucosa (series 3,
image 42) which probably corresponds to the lesion of interest.
There is no extra-mucosal extension into the overlying soft tissues
or adjacent osseous structures identify.

Salivary glands: No inflammation, mass, or stone.

Thyroid: Normal.

Lymph nodes: None enlarged or abnormal density.

Vascular: Negative.

Limited intracranial: Negative.

Visualized orbits: Negative.

Mastoids and visualized paranasal sinuses: Mild ethmoid and
maxillary sinus mucosal thickening. No sinus fluid levels. Normal
aeration of visible mastoid air cells.

Skeleton: No acute or aggressive process.

Upper chest: Negative.

Other: None.
IMPRESSION: 10 mm linear focus of mucosal enhancement of right posterior
mandibular buccal mucosa which probably corresponds to the lesion of
interest. No extra-mucosal extension into overlying soft tissues or
adjacent osseous structures identified. No lymphadenopathy by
imaging criteria.

ADDENDUM:
Right posterior mandibular buccal mucosa enhancement is nonspecific
and can be seen with aphthous stomatitis, oral cavity infection, or
inflammatory mucositis as well as neoplasm such as squamous cell
carcinoma and minor salivary gland tumor.

  By: Suhair Cirino M.D.

*** End of Addendum ***

## 2019-07-16 NOTE — Progress Notes (Signed)
[facility]  Patient ID: Candace Browning,  MRN: [id number]  PCP: Bartholome Bill, MD  Date of Evaluation: 06/19/2019 Time spent:20 minutes  Chief Complaint:  Chief Complaint    Follow-up      HISTORY/CURRENT STATUS: HPI For routine med check.   Still clenching her jaw sometimes but the mouth movements are better since we decreased the Vraylar.  She has noticed a little "stutter" is all she can say to describe it.  That has been an on and off sign with some of the antipsychotics we have used.  Her moods have been more stable.  She has had days where she did not feel manic or depressed, and it had not been quite that way in a very long time.  She has been able to enjoy things, her energy and motivation are pretty good, not crying easily, not isolating, denies suicidal or homicidal thoughts.  She tested positive for COVID since her last visit.  She had quarantine for the 2-week period.  Never had any symptoms.  She goes back to work at [employer] today for the first time in 2 weeks due to the COVID virus.  Patient denies increased energy with decreased need for sleep, no increased talkativeness, no racing thoughts, no impulsivity or risky behaviors, no increased spending, no increased libido, no grandiosity, no increased irritability or anger, and no hallucinations.  Anxiety is controlled most of the time. Xanax still helps when needed.  She does not take the Xanax very often.  Has not tried it specifically for the clenching of her jaws.  Sleeps pretty good.   Individual Medical History/ Review of Systems: Changes? :No    Past medications for mental health diagnoses include: BuSpar, Xanax, Latuda, Depakote did not work at all,  Vistaril, Lamictal caused SI, Valium, Seroquel, Abilify, Risperdal, lithium, Equetro, Geodon, prazosin,doxazosin, Luvox, Zoloft, Vraylar has TD at higher doses. Wellbutrin, Rexulti, Zyprexa caused confusion and stuttering, Ingrezza wasn't helpful.  Gabapentin unsure if it helped or not. Artane no help.   Allergies: Apple, Coconut oil, Gluten meal, Latex, Nsaids, Other, Peanut-containing drug products, Sulfa antibiotics, Hydrogen peroxide, Penicillins, and Wheat bran  Current Medications:  Current Outpatient Medications:  .  albuterol (VENTOLIN HFA) 108 (90 Base) MCG/ACT inhaler, Inhale 2 puffs into the lungs every 6 (six) hours as needed., Disp: 18 g, Rfl: 1 .  ALPRAZolam (XANAX) 0.5 MG tablet, Take 1 tablet (0.5 mg total) by mouth 2 (two) times daily as needed for anxiety., Disp: 60 tablet, Rfl: 5 .  buPROPion (WELLBUTRIN XL) 300 MG 24 hr tablet, Take 1 tablet (300 mg total) by mouth daily., Disp: 90 tablet, Rfl: 1 .  busPIRone (BUSPAR) 15 MG tablet, Take 1 tablet (15 mg total) by mouth 2 (two) times daily., Disp: 180 tablet, Rfl: 3 .  cariprazine (VRAYLAR) capsule, Take 1 capsule (3 mg total) by mouth daily., Disp: 30 capsule, Rfl: 1 .  EPINEPHrine 0.3 mg/0.3 mL IJ SOAJ injection, Inject 0.3 mLs (0.3 mg total) into the muscle as needed., Disp: 2 each, Rfl: 3 .  levocetirizine (XYZAL) 5 MG tablet, Take 5 mg by mouth every evening., Disp: , Rfl:  .  Oxcarbazepine (TRILEPTAL) 300 MG tablet, 1 po qhs for 4 days, then 1 po bid (Patient taking differently: 300 mg daily. 1 po qhs for 4 days, then 1 po bid), Disp: 60 tablet, Rfl: 1 .  valACYclovir (VALTREX) 500 MG tablet, Take 1 tablet (500 mg total) by mouth 2 (two) times daily as  needed (outbreaks)., Disp: 30 tablet, Rfl: 0 Medication Side Effects: clenching jaws, neck spasms, abnormal mouth movements  Family Medical/ Social History: Changes? No  MENTAL HEALTH EXAM:  There were no vitals taken for this visit.There is no height or weight on file to calculate BMI.  General Appearance: Casual, Neat and Well Groomed  Eye Contact:  Good  Speech:  Clear and Coherent  Volume:  Normal  Mood:  Euthymic  Affect:  Appropriate  Thought Process:  Goal Directed and Descriptions of Associations:  Intact  Orientation:  Full (Time, Place, and Person)  Thought Content: Logical   Suicidal Thoughts:  No  Homicidal Thoughts:  No  Memory:  Immediate;   Fair Recent;   Fair otherwise nl  Judgement:  Good  Insight:  Good  Psychomotor Activity:  The pursing of her lips has disappeared.  She does clench her jaws a couple of times during the exam but no other abnormal movements are noted.  Concentration:  Concentration: Good  Recall:  Good  Fund of Knowledge: Good  Language: Good  Assets:  Desire for Improvement  ADL's:  Intact  Cognition: WNL  Prognosis:  Good    DIAGNOSES:    ICD-10-CM   1. Bipolar I disorder ([place])  F31.9   2. Generalized anxiety disorder  F41.1     Receiving Psychotherapy: Yes Lilia Argue   RECOMMENDATIONS:  PDMP reviewed. Spent 20 mins w/ her. We discussed the tardive dyskinesia.  I do think it is better at present since we decreased the [place].  We have discussed Austedo and she is interested in trying it.  We agreed to have her use the Xanax first of all to see if that will help with the clenching of the jaws and/or the "stutter" and if it does not then we will start the Austedo.  Continue Vraylar 3 mg qd. Cont Xanax 0.5mg  bid prn. Continue Wellbutrin XL 300 mg, 1 qd. Continue Buspar 15 mg bid. Continue Trileptal 300 mg, 1 po bid.  Cont therapy. Return in 4 weeks.  Donnal Moat, PA-C

## 2019-07-28 ENCOUNTER — Ambulatory Visit (INDEPENDENT_AMBULATORY_CARE_PROVIDER_SITE_OTHER): Payer: Medicare Other | Admitting: *Deleted

## 2019-07-28 DIAGNOSIS — J309 Allergic rhinitis, unspecified: Secondary | ICD-10-CM

## 2019-08-06 ENCOUNTER — Ambulatory Visit (INDEPENDENT_AMBULATORY_CARE_PROVIDER_SITE_OTHER): Payer: Medicare Other | Admitting: *Deleted

## 2019-08-06 DIAGNOSIS — J309 Allergic rhinitis, unspecified: Secondary | ICD-10-CM | POA: Diagnosis not present

## 2019-08-12 ENCOUNTER — Other Ambulatory Visit: Payer: Self-pay

## 2019-08-12 ENCOUNTER — Ambulatory Visit (INDEPENDENT_AMBULATORY_CARE_PROVIDER_SITE_OTHER): Payer: Medicare Other | Admitting: Physician Assistant

## 2019-08-12 ENCOUNTER — Encounter: Payer: Self-pay | Admitting: Physician Assistant

## 2019-08-12 DIAGNOSIS — G4700 Insomnia, unspecified: Secondary | ICD-10-CM

## 2019-08-12 DIAGNOSIS — G259 Extrapyramidal and movement disorder, unspecified: Secondary | ICD-10-CM | POA: Diagnosis not present

## 2019-08-12 DIAGNOSIS — F319 Bipolar disorder, unspecified: Secondary | ICD-10-CM

## 2019-08-12 DIAGNOSIS — G2401 Drug induced subacute dyskinesia: Secondary | ICD-10-CM | POA: Diagnosis not present

## 2019-08-12 DIAGNOSIS — F411 Generalized anxiety disorder: Secondary | ICD-10-CM

## 2019-08-12 MED ORDER — BUSPIRONE HCL 15 MG PO TABS: 15.0000 mg | ORAL_TABLET | Freq: Two times a day (BID) | ORAL | 3 refills | Status: DC

## 2019-08-12 MED ORDER — OXCARBAZEPINE 300 MG PO TABS: 300.0000 mg | ORAL_TABLET | Freq: Every day | ORAL | 1 refills | Status: DC

## 2019-08-12 MED ORDER — CARIPRAZINE HCL 3 MG PO CAPS: 3.0000 mg | ORAL_CAPSULE | Freq: Every day | ORAL | 5 refills | Status: DC

## 2019-08-12 NOTE — Progress Notes (Signed)
[facility]  Patient ID: Candace Browning,  MRN: [id number]  PCP: Bartholome Bill, MD  Date of Evaluation: 08/12/2019 Time spent:20 minutes  Chief Complaint:  Chief Complaint    Follow-up      HISTORY/CURRENT STATUS:  HPI For routine follow-up of tardive dyskinesia.  At the last visit, we discussed the tardive dyskinesia and the possibility of starting Austedo.  We agreed for her to use the Xanax more frequently for the abnormal movements in her mouth before trying a different medication.  She has not needed to use the Xanax very often but it does help.  She has found herself a few times clenching her jaws on the left side and sometimes she will bite her tongue because of it.  But is not common so overall she is a lot better.  She has been able to enjoy things, her energy and motivation are pretty good, not crying easily, not isolating, denies suicidal or homicidal thoughts.  Patient denies increased energy with decreased need for sleep, no increased talkativeness, no racing thoughts, no impulsivity or risky behaviors, no increased spending, no increased libido, no grandiosity, no increased irritability or anger, and no hallucinations.  Anxiety is controlled most of the time. Xanax still helps when needed.    Denies dizziness, syncope, seizures, numbness, tingling, tremor, tics, unsteady gait, slurred speech, confusion. Denies muscle or joint pain, stiffness, or dystonia.  Individual Medical History/ Review of Systems: Changes? :No    Past medications for mental health diagnoses include: BuSpar, Xanax, Latuda, Depakote did not work at all,  Vistaril, Lamictal caused SI, Valium, Seroquel, Abilify, Risperdal, lithium, Equetro, Geodon, prazosin,doxazosin, Luvox, Zoloft, Vraylar has TD at higher doses. Wellbutrin, Rexulti, Zyprexa caused confusion and stuttering, Ingrezza wasn't helpful. Gabapentin unsure if it helped or not. Artane no help.   Allergies: Apple, Coconut  oil, Gluten meal, Latex, Nsaids, Other, Peanut-containing drug products, Sulfa antibiotics, Hydrogen peroxide, Penicillins, and Wheat bran  Current Medications:  Current Outpatient Medications:  .  albuterol (VENTOLIN HFA) 108 (90 Base) MCG/ACT inhaler, Inhale 2 puffs into the lungs every 6 (six) hours as needed., Disp: 18 g, Rfl: 1 .  ALPRAZolam (XANAX) 0.5 MG tablet, Take 1 tablet (0.5 mg total) by mouth 2 (two) times daily as needed for anxiety., Disp: 60 tablet, Rfl: 5 .  buPROPion (WELLBUTRIN XL) 300 MG 24 hr tablet, Take 1 tablet (300 mg total) by mouth daily., Disp: 90 tablet, Rfl: 1 .  busPIRone (BUSPAR) 15 MG tablet, Take 1 tablet (15 mg total) by mouth 2 (two) times daily., Disp: 180 tablet, Rfl: 3 .  cariprazine (VRAYLAR) capsule, Take 1 capsule (3 mg total) by mouth daily., Disp: 30 capsule, Rfl: 5 .  EPINEPHrine 0.3 mg/0.3 mL IJ SOAJ injection, Inject 0.3 mLs (0.3 mg total) into the muscle as needed., Disp: 2 each, Rfl: 3 .  levocetirizine (XYZAL) 5 MG tablet, Take 5 mg by mouth every evening., Disp: , Rfl:  .  Oxcarbazepine (TRILEPTAL) 300 MG tablet, Take 1 tablet (300 mg total) by mouth daily., Disp: 90 tablet, Rfl: 1 .  valACYclovir (VALTREX) 500 MG tablet, Take 1 tablet (500 mg total) by mouth 2 (two) times daily as needed (outbreaks)., Disp: 30 tablet, Rfl: 0 Medication Side Effects: Occasional bruxism  Family Medical/ Social History: Changes? No  MENTAL HEALTH EXAM:  There were no vitals taken for this visit.There is no height or weight on file to calculate BMI.  General Appearance: Casual, Neat and Well Groomed  Eye Contact:  Good  Speech:  Clear and Coherent  Volume:  Normal  Mood:  Euthymic  Affect:  Appropriate  Thought Process:  Goal Directed and Descriptions of Associations: Intact  Orientation:  Full (Time, Place, and Person)  Thought Content: Logical   Suicidal Thoughts:  No  Homicidal Thoughts:  No  Memory:  Immediate;   Fair Recent;   Fair otherwise nl   Judgement:  Good  Insight:  Good  Psychomotor Activity:  Normal and No pursing of lips or clenching her jaws at all  Concentration:  Concentration: Good  Recall:  Good  Fund of Knowledge: Good  Language: Good  Assets:  Desire for Improvement  ADL's:  Intact  Cognition: WNL  Prognosis:  Good    DIAGNOSES:    ICD-10-CM   1. Extrapyramidal and movement disorder, unspecified  G25.9   2. Bipolar I disorder ([place])  F31.9   3. Insomnia, unspecified type  G47.00   4. Generalized anxiety disorder  F41.1   5. Tardive dyskinesia  G24.01     Receiving Psychotherapy: Yes Lilia Argue   RECOMMENDATIONS:  PDMP reviewed. Spent 20 mins w/ her. I am glad to see her doing better! Continue Vraylar 3 mg qd. Cont Xanax 0.5mg  bid prn. Continue Wellbutrin XL 300 mg, 1 qd. Continue Buspar 15 mg bid. Continue Trileptal 300 mg, 1 p.o. daily. Cont therapy. Return in 6 weeks.  Donnal Moat, PA-C

## 2019-09-01 ENCOUNTER — Ambulatory Visit (INDEPENDENT_AMBULATORY_CARE_PROVIDER_SITE_OTHER): Payer: Medicare Other

## 2019-09-01 DIAGNOSIS — J309 Allergic rhinitis, unspecified: Secondary | ICD-10-CM

## 2019-09-08 ENCOUNTER — Ambulatory Visit (INDEPENDENT_AMBULATORY_CARE_PROVIDER_SITE_OTHER): Payer: Medicare Other | Admitting: *Deleted

## 2019-09-08 DIAGNOSIS — J309 Allergic rhinitis, unspecified: Secondary | ICD-10-CM

## 2019-09-09 NOTE — Progress Notes (Signed)
EXP 09/09/20

## 2019-09-11 DIAGNOSIS — J301 Allergic rhinitis due to pollen: Secondary | ICD-10-CM

## 2019-09-15 DIAGNOSIS — J3089 Other allergic rhinitis: Secondary | ICD-10-CM

## 2019-09-16 DIAGNOSIS — J3081 Allergic rhinitis due to animal (cat) (dog) hair and dander: Secondary | ICD-10-CM | POA: Diagnosis not present

## 2019-09-17 ENCOUNTER — Other Ambulatory Visit: Payer: Self-pay

## 2019-09-17 ENCOUNTER — Telehealth: Payer: Self-pay | Admitting: Physician Assistant

## 2019-09-17 MED ORDER — BUPROPION HCL ER (XL) 300 MG PO TB24: 300.0000 mg | ORAL_TABLET | Freq: Every day | ORAL | 1 refills | Status: DC

## 2019-09-17 NOTE — Telephone Encounter (Signed)
Candace Browning called to request that her Bupropion prescription be transferred from [facility] to [facility] on [address] in [place].  The pharmacy wouldn't do it for her.

## 2019-09-17 NOTE — Telephone Encounter (Signed)
Rx sent to [facility]

## 2019-09-29 ENCOUNTER — Ambulatory Visit (INDEPENDENT_AMBULATORY_CARE_PROVIDER_SITE_OTHER): Payer: Medicare Other | Admitting: *Deleted

## 2019-09-29 DIAGNOSIS — J309 Allergic rhinitis, unspecified: Secondary | ICD-10-CM

## 2019-09-30 ENCOUNTER — Other Ambulatory Visit: Payer: Self-pay

## 2019-09-30 ENCOUNTER — Ambulatory Visit (INDEPENDENT_AMBULATORY_CARE_PROVIDER_SITE_OTHER): Payer: Medicare Other | Admitting: Physician Assistant

## 2019-09-30 ENCOUNTER — Encounter: Payer: Self-pay | Admitting: Physician Assistant

## 2019-09-30 DIAGNOSIS — F411 Generalized anxiety disorder: Secondary | ICD-10-CM | POA: Diagnosis not present

## 2019-09-30 DIAGNOSIS — F3112 Bipolar disorder, current episode manic without psychotic features, moderate: Secondary | ICD-10-CM

## 2019-09-30 DIAGNOSIS — G2401 Drug induced subacute dyskinesia: Secondary | ICD-10-CM

## 2019-09-30 DIAGNOSIS — G4700 Insomnia, unspecified: Secondary | ICD-10-CM | POA: Diagnosis not present

## 2019-09-30 MED ORDER — ALPRAZOLAM 0.5 MG PO TABS: 0.5000 mg | ORAL_TABLET | Freq: Two times a day (BID) | ORAL | 5 refills | Status: DC | PRN

## 2019-09-30 MED ORDER — CARIPRAZINE HCL 3 MG PO CAPS: 3.0000 mg | ORAL_CAPSULE | Freq: Every day | ORAL | 5 refills | Status: DC

## 2019-09-30 MED ORDER — OXCARBAZEPINE 300 MG PO TABS: 300.0000 mg | ORAL_TABLET | Freq: Two times a day (BID) | ORAL | 1 refills | Status: DC

## 2019-09-30 NOTE — Progress Notes (Signed)
[facility]  Patient ID: Candace Browning,  MRN: [id number]  PCP: Bartholome Bill, MD  Date of Evaluation: 08/12/2019 Time spent:30 minutes  Chief Complaint:  Chief Complaint    Manic Behavior      HISTORY/CURRENT STATUS:  HPI Not doing well.  For the past week, has been more manic.  "Money won't stay in my pocket."  Has 4 books open right now, b/c can't concentrate on any of them. Not sleeping well, will wake up often, then gets up, needs a nap.  Feels like she gets enough sleep, it's just all broken up. Hypersexual, but no risky behaviors.  Not impulsive except for spending. Has more energy. "Who needs sleep." Is short tempered.  Easily angered for no reason. No AH/VH or paranoia. Has a lot of energy to clean house.   She has been able to enjoy things, her energy and motivation are pretty good, not crying easily, not isolating, work is going well, denies suicidal or homicidal thoughts.  Anxiety is controlled most of the time. Xanax still helps when needed.  She is having leg shaking at times.  Not always in its not any worse than it has been.  Xanax does help that a little bit.  Same for the mouth movements.  They do not seem as bad right now either.  She is having some problems with right jaw pain.  She is seeing her PCP tomorrow for that.  A month or so ago she yawned and felt something pop in her right jaw and it has been painful ever since.  Denies dizziness, syncope, seizures, numbness, tingling, tremor, tics, unsteady gait, slurred speech, confusion. Denies muscle or joint pain, stiffness, or dystonia.  Individual Medical History/ Review of Systems: Changes? :No    Past medications for mental health diagnoses include: BuSpar, Xanax, Latuda, Depakote did not work at all,  Vistaril, Lamictal caused SI, Valium, Seroquel, Abilify, Risperdal, lithium, Equetro, Geodon, prazosin,doxazosin, Luvox, Zoloft, Vraylar has TD at higher doses. Wellbutrin, Rexulti, Zyprexa  caused confusion and stuttering, Ingrezza wasn't helpful. Gabapentin unsure if it helped or not. Artane no help.   Allergies: Apple, Coconut oil, Gluten meal, Latex, Nsaids, Other, Peanut-containing drug products, Sulfa antibiotics, Hydrogen peroxide, Penicillins, and Wheat bran  Current Medications:  Current Outpatient Medications:  .  albuterol (VENTOLIN HFA) 108 (90 Base) MCG/ACT inhaler, Inhale 2 puffs into the lungs every 6 (six) hours as needed., Disp: 18 g, Rfl: 1 .  ALPRAZolam (XANAX) 0.5 MG tablet, Take 1 tablet (0.5 mg total) by mouth 2 (two) times daily as needed for anxiety., Disp: 60 tablet, Rfl: 5 .  buPROPion (WELLBUTRIN XL) 300 MG 24 hr tablet, Take 1 tablet (300 mg total) by mouth daily., Disp: 90 tablet, Rfl: 1 .  busPIRone (BUSPAR) 15 MG tablet, Take 1 tablet (15 mg total) by mouth 2 (two) times daily., Disp: 180 tablet, Rfl: 3 .  cariprazine (VRAYLAR) capsule, Take 1 capsule (3 mg total) by mouth daily., Disp: 30 capsule, Rfl: 5 .  EPINEPHrine 0.3 mg/0.3 mL IJ SOAJ injection, Inject 0.3 mLs (0.3 mg total) into the muscle as needed., Disp: 2 each, Rfl: 3 .  levocetirizine (XYZAL) 5 MG tablet, Take 5 mg by mouth every evening., Disp: , Rfl:  .  Oxcarbazepine (TRILEPTAL) 300 MG tablet, Take 1 tablet (300 mg total) by mouth 2 (two) times daily., Disp: 180 tablet, Rfl: 1 .  valACYclovir (VALTREX) 500 MG tablet, Take 1 tablet (500 mg total) by mouth 2 (two) times  daily as needed (outbreaks)., Disp: 30 tablet, Rfl: 0 Medication Side Effects: Occasional bruxism  Family Medical/ Social History: Changes? No  MENTAL HEALTH EXAM:  There were no vitals taken for this visit.There is no height or weight on file to calculate BMI.  General Appearance: Casual, Neat and Well Groomed  Eye Contact:  Good  Speech:  Clear and Coherent and Normal Rate  Volume:  Normal  Mood:  Euthymic  Affect:  Appropriate  Thought Process:  Goal Directed and Descriptions of Associations: Intact   Orientation:  Full (Time, Place, and Person)  Thought Content: Logical   Suicidal Thoughts:  No  Homicidal Thoughts:  No  Memory:  Immediate;   Fair Recent;   Fair otherwise nl  Judgement:  Good  Insight:  Good  Psychomotor Activity:  Normal  Concentration:  Concentration: Good and Attention Span: Good  Recall:  Good  Fund of Knowledge: Good  Language: Good  Assets:  Desire for Improvement  ADL's:  Intact  Cognition: WNL  Prognosis:  Good    DIAGNOSES:    ICD-10-CM   1. Bipolar 1 disorder with moderate mania ([place])  F31.12   2. Generalized anxiety disorder  F41.1   3. Tardive dyskinesia  G24.01   4. Insomnia, unspecified type  G47.00     Receiving Psychotherapy: Yes Lilia Argue   RECOMMENDATIONS:  PDMP reviewed. I provided 30 minutes of face-to-face time during this encounter. The tardive dyskinesia has improved, at least during this visit.  I do not detect any abnormal mouth movements at all.  She does shake her legs some, but that can be common for her.  When we increased the Vraylar in the past, the tardive dyskinesia worsened.  So we agreed to leave that the same. I do recommend increasing the Trileptal because that can be very effective for being short tempered, increased agitation, or anxiety. Continue Vraylar 3 mg qd. Cont Xanax 0.5mg  bid prn. Continue Wellbutrin XL 300 mg, 1 qd. Continue Buspar 15 mg bid. Increase Trileptal 300 mg to 1 p.o. twice daily.  If she is not able to tolerate that due to drowsiness, take both pills at bedtime. Cont therapy. Return in 4 weeks.  Donnal Moat, PA-C

## 2019-10-01 DIAGNOSIS — K119 Disease of salivary gland, unspecified: Secondary | ICD-10-CM | POA: Diagnosis not present

## 2019-10-13 ENCOUNTER — Ambulatory Visit (INDEPENDENT_AMBULATORY_CARE_PROVIDER_SITE_OTHER): Payer: Medicare Other | Admitting: *Deleted

## 2019-10-13 DIAGNOSIS — J309 Allergic rhinitis, unspecified: Secondary | ICD-10-CM | POA: Diagnosis not present

## 2019-10-28 ENCOUNTER — Encounter: Payer: Self-pay | Admitting: Physician Assistant

## 2019-10-28 ENCOUNTER — Other Ambulatory Visit: Payer: Self-pay

## 2019-10-28 ENCOUNTER — Ambulatory Visit (INDEPENDENT_AMBULATORY_CARE_PROVIDER_SITE_OTHER): Payer: Medicare Other | Admitting: Physician Assistant

## 2019-10-28 DIAGNOSIS — F3281 Premenstrual dysphoric disorder: Secondary | ICD-10-CM

## 2019-10-28 DIAGNOSIS — G4700 Insomnia, unspecified: Secondary | ICD-10-CM | POA: Diagnosis not present

## 2019-10-28 DIAGNOSIS — F319 Bipolar disorder, unspecified: Secondary | ICD-10-CM

## 2019-10-28 DIAGNOSIS — F411 Generalized anxiety disorder: Secondary | ICD-10-CM

## 2019-10-28 MED ORDER — SERTRALINE HCL 25 MG PO TABS
ORAL_TABLET | ORAL | 0 refills | Status: DC
Start: 2019-10-28 — End: 2020-05-25

## 2019-10-28 NOTE — Progress Notes (Signed)
[facility]  Patient ID: AYSE MCCARTIN,  MRN: [id number]  PCP: Bartholome Bill, MD  Date of Evaluation:10/28/2019 Time spent:30 minutes  Chief Complaint:  Chief Complaint    Anxiety; Depression; Insomnia      HISTORY/CURRENT STATUS:  HPI For routine med check.  At [place], we increased the Trileptal b/c she was more manic. Is a lot better with that. Patient denies increased energy with decreased need for sleep, no increased talkativeness, no racing thoughts, no impulsivity or risky behaviors, no increased spending, no increased libido, no grandiosity, no increased irritability or anger, no paranoia, and no hallucinations.  Noticed this past month that she got really depressed about 5 days before her period started.  It was to the point about 5 days ago that she even went to bed for about 3 days, except when she was working.  She went from being manic down to being really depressed within an hour.  She was wondering if maybe increasing the Trileptal had done that but she had been on the higher dose for about 3 weeks and the mania had started to resolve gradually so she is not sure that would be the problem.  Looking back, she feels like she has had some depression and may be PMS symptoms but not exactly sure of the timing or how bad they were.  She just wanted to mention this because it has been a big deal this month.  She has been able to enjoy things, her energy and motivation are pretty good, not crying easily, not isolating, work is going well, denies suicidal or homicidal thoughts.  Anxiety is controlled most of the time. Xanax still helps when needed.  She is having leg shaking at times.  But not too, and right now.  No abnormal mouth movements.  No shaking of her hands.  Denies dizziness, syncope, seizures, numbness, tingling, tremor, tics, unsteady gait, slurred speech, confusion. Denies muscle or joint pain, stiffness, or dystonia.  Individual Medical History/  Review of Systems: Changes? :No    Past medications for mental health diagnoses include: BuSpar, Xanax, Latuda, Depakote did not work at all,  Vistaril, Lamictal caused SI, Valium, Seroquel, Abilify, Risperdal, lithium, Equetro, Geodon, prazosin,doxazosin, Luvox, Zoloft, Vraylar has TD at higher doses. Wellbutrin, Rexulti, Zyprexa caused confusion and stuttering, Ingrezza wasn't helpful. Gabapentin unsure if it helped or not. Artane no help.   Allergies: Apple, Coconut oil, Gluten meal, Latex, Nsaids, Other, Peanut-containing drug products, Sulfa antibiotics, Hydrogen peroxide, Penicillins, and Wheat bran  Current Medications:  Current Outpatient Medications:  .  albuterol (VENTOLIN HFA) 108 (90 Base) MCG/ACT inhaler, Inhale 2 puffs into the lungs every 6 (six) hours as needed., Disp: 18 g, Rfl: 1 .  ALPRAZolam (XANAX) 0.5 MG tablet, Take 1 tablet (0.5 mg total) by mouth 2 (two) times daily as needed for anxiety., Disp: 60 tablet, Rfl: 5 .  buPROPion (WELLBUTRIN XL) 300 MG 24 hr tablet, Take 1 tablet (300 mg total) by mouth daily., Disp: 90 tablet, Rfl: 1 .  busPIRone (BUSPAR) 15 MG tablet, Take 1 tablet (15 mg total) by mouth 2 (two) times daily., Disp: 180 tablet, Rfl: 3 .  cariprazine (VRAYLAR) capsule, Take 1 capsule (3 mg total) by mouth daily., Disp: 30 capsule, Rfl: 5 .  EPINEPHrine 0.3 mg/0.3 mL IJ SOAJ injection, Inject 0.3 mLs (0.3 mg total) into the muscle as needed., Disp: 2 each, Rfl: 3 .  fluticasone (FLONASE) 50 MCG/ACT nasal spray, Place into both nostrils daily., Disp: ,  Rfl:  .  levocetirizine (XYZAL) 5 MG tablet, Take 5 mg by mouth every evening., Disp: , Rfl:  .  Oxcarbazepine (TRILEPTAL) 300 MG tablet, Take 1 tablet (300 mg total) by mouth 2 (two) times daily., Disp: 180 tablet, Rfl: 1 .  valACYclovir (VALTREX) 500 MG tablet, Take 1 tablet (500 mg total) by mouth 2 (two) times daily as needed (outbreaks)., Disp: 30 tablet, Rfl: 0 .  sertraline (ZOLOFT) 25 MG tablet, Take 1 by  mouth daily for 5 to 7 days before menses begins.  After menses starts, stop the Zoloft., Disp: 30 tablet, Rfl: 0 Medication Side Effects: Occasional bruxism  Family Medical/ Social History: Changes? No  MENTAL HEALTH EXAM:  There were no vitals taken for this visit.There is no height or weight on file to calculate BMI.  General Appearance: Casual, Neat and Well Groomed  Eye Contact:  Good  Speech:  Clear and Coherent and Normal Rate  Volume:  Normal  Mood:  Euthymic  Affect:  Appropriate  Thought Process:  Goal Directed and Descriptions of Associations: Intact  Orientation:  Full (Time, Place, and Person)  Thought Content: Logical   Suicidal Thoughts:  No  Homicidal Thoughts:  No  Memory:  Immediate;   Fair Recent;   Fair otherwise nl  Judgement:  Good  Insight:  Good  Psychomotor Activity:  Normal  Concentration:  Concentration: Good and Attention Span: Good  Recall:  Good  Fund of Knowledge: Good  Language: Good  Assets:  Desire for Improvement  ADL's:  Intact  Cognition: WNL  Prognosis:  Good    DIAGNOSES:    ICD-10-CM   1. PMDD (premenstrual dysphoric disorder)  F32.81   2. Bipolar I disorder ([place])  F31.9   3. Generalized anxiety disorder  F41.1   4. Insomnia, unspecified type  G47.00     Receiving Psychotherapy: Yes Lilia Argue   RECOMMENDATIONS:  PDMP reviewed. I provided 30 minutes of face-to-face time during this encounter. We discussed PMDD at length.  This very well could be at least a part of the reason she has severe depression at times, but neither of [place] have put the symptoms and the cycle together.  I have asked her to do a brief mood diary and associated with her menses.  Her menses are irregular so it may be difficult to follow but I do recommend that we add a low-dose of an SSRI in just for 5 to 7 days before her period starts.  I explained serotonin and estrogen relationship and how that can cause severe depression premenstrually, thus having a  low-dose SSRI will be helpful.  She verbalizes understanding. Start Zoloft 25 mg, 1 p.o. daily beginning 5 to 7 days before her expected menses is to start.  Or if she is unsure, the day that she starts feeling really depressed, start the Zoloft again.  She will take the Zoloft until her menstrual cycle starts and then she will stop the Zoloft.  She should take the Zoloft no longer than 7 days/month.  She verbalizes understanding.   Continue Vraylar 3 mg qd. Cont Xanax 0.5mg  bid prn. Continue Wellbutrin XL 300 mg, 1 qd. Continue Buspar 15 mg bid. Continue Trileptal 300 mg, 1 p.o. twice daily. Cont therapy. Return in 6 to 8 weeks.  Donnal Moat, PA-C

## 2019-11-03 ENCOUNTER — Ambulatory Visit (INDEPENDENT_AMBULATORY_CARE_PROVIDER_SITE_OTHER): Payer: Medicare Other | Admitting: *Deleted

## 2019-11-03 DIAGNOSIS — J309 Allergic rhinitis, unspecified: Secondary | ICD-10-CM

## 2019-11-27 DIAGNOSIS — J4530 Mild persistent asthma, uncomplicated: Secondary | ICD-10-CM | POA: Diagnosis not present

## 2019-11-27 DIAGNOSIS — Z8616 Personal history of COVID-19: Secondary | ICD-10-CM | POA: Diagnosis not present

## 2019-12-08 ENCOUNTER — Ambulatory Visit (INDEPENDENT_AMBULATORY_CARE_PROVIDER_SITE_OTHER): Payer: Medicare Other | Admitting: *Deleted

## 2019-12-08 DIAGNOSIS — J309 Allergic rhinitis, unspecified: Secondary | ICD-10-CM

## 2019-12-09 DIAGNOSIS — B373 Candidiasis of vulva and vagina: Secondary | ICD-10-CM | POA: Diagnosis not present

## 2019-12-12 DIAGNOSIS — R05 Cough: Secondary | ICD-10-CM | POA: Diagnosis not present

## 2019-12-16 ENCOUNTER — Other Ambulatory Visit: Payer: Self-pay

## 2019-12-16 ENCOUNTER — Encounter: Payer: Self-pay | Admitting: Physician Assistant

## 2019-12-16 ENCOUNTER — Ambulatory Visit: Payer: Medicare Other | Admitting: Physician Assistant

## 2019-12-22 ENCOUNTER — Ambulatory Visit (INDEPENDENT_AMBULATORY_CARE_PROVIDER_SITE_OTHER): Payer: Medicare Other | Admitting: *Deleted

## 2019-12-22 DIAGNOSIS — J309 Allergic rhinitis, unspecified: Secondary | ICD-10-CM | POA: Diagnosis not present

## 2019-12-25 ENCOUNTER — Ambulatory Visit (INDEPENDENT_AMBULATORY_CARE_PROVIDER_SITE_OTHER): Payer: Medicare Other | Admitting: Physician Assistant

## 2019-12-25 ENCOUNTER — Encounter: Payer: Self-pay | Admitting: Physician Assistant

## 2019-12-25 ENCOUNTER — Other Ambulatory Visit: Payer: Self-pay

## 2019-12-25 DIAGNOSIS — F411 Generalized anxiety disorder: Secondary | ICD-10-CM | POA: Diagnosis not present

## 2019-12-25 DIAGNOSIS — F3281 Premenstrual dysphoric disorder: Secondary | ICD-10-CM

## 2019-12-25 DIAGNOSIS — F3113 Bipolar disorder, current episode manic without psychotic features, severe: Secondary | ICD-10-CM

## 2019-12-25 MED ORDER — OXCARBAZEPINE 300 MG PO TABS: ORAL_TABLET | ORAL | 1 refills | Status: DC

## 2019-12-25 NOTE — Progress Notes (Signed)
[facility]  Patient ID: Candace Browning,  MRN: [id number]  PCP: Bartholome Bill, MD  Date of Evaluation:10/7//2021 Time spent:30 minutes  Chief Complaint:  Chief Complaint    Manic Behavior      HISTORY/CURRENT STATUS:  For routine med check.  For about a week or so, has been more manic.  Sleeping ok when she takes the Xanax. Needing it more for the past week. Has been more talkative, spending more money and is impulsive with purchases, has had more energy, not needing as much sleep, no change in libido, no paranoia, and no AH/VH.  She has been able to enjoy things, not crying easily, not isolating, work is going well, denies suicidal or homicidal thoughts.  As far as PMDD goes, she thinks the zoloft pre-menstrually may have helped some, but enough cycles haven't gone by to know for sure.   Anxiety is controlled most of the time. Xanax still helps when needed.  She is having leg shaking at times.  But not too bad. No abnormal mouth movements.  No shaking of her hands.  Denies dizziness, syncope, seizures, numbness, tingling, tremor, tics, unsteady gait, slurred speech, confusion. Denies muscle or joint pain, stiffness, or dystonia.  Individual Medical History/ Review of Systems: Changes? :No    Past medications for mental health diagnoses include: BuSpar, Xanax, Latuda, Depakote did not work at all,  Vistaril, Lamictal caused SI, Valium, Seroquel, Abilify, Risperdal, lithium, Equetro, Geodon, prazosin,doxazosin, Luvox, Zoloft, Vraylar has TD at higher doses. Wellbutrin, Rexulti, Zyprexa caused confusion and stuttering, Ingrezza wasn't helpful. Gabapentin unsure if it helped or not. Artane no help.   Allergies: Apple, Coconut oil, Gluten meal, Latex, Nsaids, Other, Peanut-containing drug products, Sulfa antibiotics, Hydrogen peroxide, Penicillins, and Wheat bran  Current Medications:  Current Outpatient Medications:  .  ALPRAZolam (XANAX) 0.5 MG tablet, Take  1 tablet (0.5 mg total) by mouth 2 (two) times daily as needed for anxiety., Disp: 60 tablet, Rfl: 5 .  buPROPion (WELLBUTRIN XL) 300 MG 24 hr tablet, Take 1 tablet (300 mg total) by mouth daily., Disp: 90 tablet, Rfl: 1 .  busPIRone (BUSPAR) 15 MG tablet, Take 1 tablet (15 mg total) by mouth 2 (two) times daily., Disp: 180 tablet, Rfl: 3 .  cariprazine (VRAYLAR) capsule, Take 1 capsule (3 mg total) by mouth daily., Disp: 30 capsule, Rfl: 5 .  EPINEPHrine 0.3 mg/0.3 mL IJ SOAJ injection, Inject 0.3 mLs (0.3 mg total) into the muscle as needed., Disp: 2 each, Rfl: 3 .  fluticasone (FLONASE) 50 MCG/ACT nasal spray, Place into both nostrils daily., Disp: , Rfl:  .  Oxcarbazepine (TRILEPTAL) 300 MG tablet, 1 po tid for 2 days, then 2 po bid., Disp: 180 tablet, Rfl: 1 .  sertraline (ZOLOFT) 25 MG tablet, Take 1 by mouth daily for 5 to 7 days before menses begins.  After menses starts, stop the Zoloft., Disp: 30 tablet, Rfl: 0 .  valACYclovir (VALTREX) 500 MG tablet, Take 1 tablet (500 mg total) by mouth 2 (two) times daily as needed (outbreaks)., Disp: 30 tablet, Rfl: 0 .  albuterol (VENTOLIN HFA) 108 (90 Base) MCG/ACT inhaler, Inhale 2 puffs into the lungs every 6 (six) hours as needed. (Patient not taking: Reported on 12/16/2019), Disp: 18 g, Rfl: 1 .  levocetirizine (XYZAL) 5 MG tablet, Take 5 mg by mouth every evening. (Patient not taking: Reported on 12/25/2019), Disp: , Rfl:  Medication Side Effects: Occasional bruxism  Family Medical/ Social History: Changes? No  MENTAL HEALTH  EXAM:  There were no vitals taken for this visit.There is no height or weight on file to calculate BMI.  General Appearance: Casual, Neat and Well Groomed  Eye Contact:  Good  Speech:  Clear and Coherent and Talkative  Volume:  Normal  Mood:  Euthymic  Affect:  Appropriate  Thought Process:  Goal Directed and Descriptions of Associations: Intact  Orientation:  Full (Time, Place, and Person)  Thought Content: Logical    Suicidal Thoughts:  No  Homicidal Thoughts:  No  Memory:  Immediate;   Fair Recent;   Fair otherwise nl  Judgement:  Good  Insight:  Good  Psychomotor Activity:  Normal  Concentration:  Concentration: Good and Attention Span: Good  Recall:  Good  Fund of Knowledge: Good  Language: Good  Assets:  Desire for Improvement  ADL's:  Intact  Cognition: WNL  Prognosis:  Good    DIAGNOSES:    ICD-10-CM   1. Bipolar disorder, current episode manic without psychotic features, severe ([place])  F31.13   2. PMDD (premenstrual dysphoric disorder)  F32.81   3. Generalized anxiety disorder  F41.1     Receiving Psychotherapy: Yes Lilia Argue   RECOMMENDATIONS:  PDMP reviewed. I provided 30 minutes of face-to-face time during this encounter. Disc current mania. She had TD at higher doses of Vraylar so increasing that isn't an option. I recommend increasing the Trileptal. She agrees.  Continue Zoloft 25 mg, 1 p.o. daily beginning 5 to 7 days before her expected menses is to start.   Continue Vraylar 3 mg qd. Cont Xanax 0.5mg  bid prn. Continue Wellbutrin XL 300 mg, 1 qd. Continue Buspar 15 mg bid. Increase Trileptal 300 mg, to 1 po tid for 2 days, then to 2 po bid. Cont therapy. Return in 4 weeks.  Donnal Moat, PA-C

## 2020-01-05 ENCOUNTER — Ambulatory Visit (INDEPENDENT_AMBULATORY_CARE_PROVIDER_SITE_OTHER): Payer: Medicare Other | Admitting: *Deleted

## 2020-01-05 DIAGNOSIS — J309 Allergic rhinitis, unspecified: Secondary | ICD-10-CM

## 2020-01-27 ENCOUNTER — Telehealth (INDEPENDENT_AMBULATORY_CARE_PROVIDER_SITE_OTHER): Payer: Medicare Other | Admitting: Physician Assistant

## 2020-01-27 ENCOUNTER — Telehealth: Payer: Self-pay | Admitting: Physician Assistant

## 2020-01-27 ENCOUNTER — Encounter: Payer: Self-pay | Admitting: Physician Assistant

## 2020-01-27 DIAGNOSIS — F3162 Bipolar disorder, current episode mixed, moderate: Secondary | ICD-10-CM

## 2020-01-27 DIAGNOSIS — G2401 Drug induced subacute dyskinesia: Secondary | ICD-10-CM

## 2020-01-27 DIAGNOSIS — F411 Generalized anxiety disorder: Secondary | ICD-10-CM | POA: Diagnosis not present

## 2020-01-27 DIAGNOSIS — F3281 Premenstrual dysphoric disorder: Secondary | ICD-10-CM

## 2020-01-27 DIAGNOSIS — Z20822 Contact with and (suspected) exposure to covid-19: Secondary | ICD-10-CM | POA: Diagnosis not present

## 2020-01-27 DIAGNOSIS — G4700 Insomnia, unspecified: Secondary | ICD-10-CM | POA: Diagnosis not present

## 2020-01-27 MED ORDER — OXCARBAZEPINE 300 MG PO TABS: 300.0000 mg | ORAL_TABLET | Freq: Two times a day (BID) | ORAL | Status: DC

## 2020-01-27 NOTE — Telephone Encounter (Signed)
Ms. Candace Browning, Candace Browning are scheduled for a virtual visit with your provider today.    Just as we do with appointments in the office, we must obtain your consent to participate.  Your consent will be active for this visit and any virtual visit you may have with one of our providers in the next 365 days.    If you have a MyChart account, I can also send a copy of this consent to you electronically.  All virtual visits are billed to your insurance company just like a traditional visit in the office.  As this is a virtual visit, video technology does not allow for your provider to perform a traditional examination.  This may limit your provider's ability to fully assess your condition.  If your provider identifies any concerns that need to be evaluated in person or the need to arrange testing such as labs, EKG, etc, we will make arrangements to do so.    Although advances in technology are sophisticated, we cannot ensure that it will always work on either your end or our end.  If the connection with a video visit is poor, we may have to switch to a telephone visit.  With either a video or telephone visit, we are not always able to ensure that we have a secure connection.   I need to obtain your verbal consent now.   Are you willing to proceed with your visit today?   Candace Browning has provided verbal consent on 01/27/2020 for a virtual visit (video or telephone).   Donnal Moat, PA-C 01/27/2020  5:29 PM

## 2020-01-27 NOTE — Progress Notes (Signed)
[facility]  Patient ID: Candace Browning,  MRN: [id number]  PCP: Bartholome Bill, MD  Date of Evaluation:01/27/2020 Time spent:30 minutes  Virtual Visit via Telehealth  I connected with patient by a video enabled telemedicine application with their informed consent, and verified patient privacy and that I am speaking with the correct person using two identifiers.  I am private, in my office and the patient is at home.  I discussed the limitations, risks, security and privacy concerns of performing an evaluation and management service by video and the availability of in person appointments. I also discussed with the patient that there may be a patient responsible charge related to this service. The patient expressed understanding and agreed to proceed.   I discussed the assessment and treatment plan with the patient. The patient was provided an opportunity to ask questions and all were answered. The patient agreed with the plan and demonstrated an understanding of the instructions.   The patient was advised to call back or seek an in-person evaluation if the symptoms worsen or if the condition fails to improve as anticipated.  I provided 30 minutes of non-face-to-face time during this encounter.   Chief Complaint:  Chief Complaint    Anxiety; Depression; Insomnia      HISTORY/CURRENT STATUS:  For routine med check.  At [place], we increased the Trileptal for mania. It made her extremely dizzy associated with nausea and vomiting.  She took it for 3 days before she realized that was the only change that had been made.  When she went back down to 300 mg twice daily, the dizziness, nausea and vomiting resolved.  She has not had a menstrual cycle this month.  They are usually very regular.  She took the Zoloft for 7 days last week as directed for PMDD but nothing has happened.  Husband has had a vasectomy so no concerns of pregnancy at this point.  States she has been a  little "down" for 2 or 3 days now.  She knows what severe depression feels like and this is not severe.  She is able to function at home and at work, except for the fact that she is sick with a respiratory infection right now and awaiting COVID test results.  If she was not sick with this though she would be able to work.  She has been able to enjoy things.  Energy and motivation are good.  She is not isolating.  Not crying easily.  Denies suicidal or homicidal thoughts.  The manic symptoms she was having a month ago went away within a week after our visit.  Of course the first 3 days after the visit she was vomiting and felt horrible so does not know if she was manic or not.  She slept for hours at a time and had to miss work which is unlike her.  At this point, she denies increased energy with decreased need for sleep, no increased talkativeness, no racing thoughts, no impulsivity or risky behaviors, no increased spending, no increased libido, no grandiosity, no increased irritability or anger, no paranoia, and no hallucinations.  Anxiety is controlled most of the time. Xanax still helps when needed.  Occasionally grits her teeth and has leg shaking but no abnormal mouth or tongue movements or other tremors.  Denies dizziness, syncope, seizures, numbness, tingling, tremor, tics, unsteady gait, slurred speech, confusion. Denies muscle or joint pain, stiffness, or dystonia.  Individual Medical History/ Review of Systems: Changes? :No  Past medications for mental health diagnoses include: BuSpar, Xanax, Latuda, Depakote did not work at all,  Vistaril, Lamictal caused SI, Valium, Seroquel, Abilify, Risperdal, lithium, Equetro, Geodon, prazosin,doxazosin, Luvox, Zoloft, Vraylar has TD at higher doses. Wellbutrin, Rexulti, Zyprexa caused confusion and stuttering, Ingrezza wasn't helpful. Gabapentin unsure if it helped or not. Artane no help, Trileptal at doses higher than 300 mg bid caused severe vomiting  and vertigo.   Allergies: Apple, Coconut oil, Gluten meal, Latex, Nsaids, Other, Peanut-containing drug products, Sulfa antibiotics, Hydrogen peroxide, Penicillins, and Wheat bran  Current Medications:  Current Outpatient Medications:    albuterol (VENTOLIN HFA) 108 (90 Base) MCG/ACT inhaler, Inhale 2 puffs into the lungs every 6 (six) hours as needed., Disp: 18 g, Rfl: 1   ALPRAZolam (XANAX) 0.5 MG tablet, Take 1 tablet (0.5 mg total) by mouth 2 (two) times daily as needed for anxiety., Disp: 60 tablet, Rfl: 5   buPROPion (WELLBUTRIN XL) 300 MG 24 hr tablet, Take 1 tablet (300 mg total) by mouth daily., Disp: 90 tablet, Rfl: 1   busPIRone (BUSPAR) 15 MG tablet, Take 1 tablet (15 mg total) by mouth 2 (two) times daily., Disp: 180 tablet, Rfl: 3   cariprazine (VRAYLAR) capsule, Take 1 capsule (3 mg total) by mouth daily., Disp: 30 capsule, Rfl: 5   EPINEPHrine 0.3 mg/0.3 mL IJ SOAJ injection, Inject 0.3 mLs (0.3 mg total) into the muscle as needed., Disp: 2 each, Rfl: 3   fluticasone (FLONASE) 50 MCG/ACT nasal spray, Place into both nostrils daily., Disp: , Rfl:    Oxcarbazepine (TRILEPTAL) 300 MG tablet, 1 po tid for 2 days, then 2 po bid. (Patient taking differently: 300 mg 2 (two) times daily. 1 po bid), Disp: 180 tablet, Rfl: 1   sertraline (ZOLOFT) 25 MG tablet, Take 1 by mouth daily for 5 to 7 days before menses begins.  After menses starts, stop the Zoloft., Disp: 30 tablet, Rfl: 0   UNABLE TO FIND, ashwaganda, Disp: , Rfl:    valACYclovir (VALTREX) 500 MG tablet, Take 1 tablet (500 mg total) by mouth 2 (two) times daily as needed (outbreaks)., Disp: 30 tablet, Rfl: 0   levocetirizine (XYZAL) 5 MG tablet, Take 5 mg by mouth every evening. (Patient not taking: Reported on 01/27/2020), Disp: , Rfl:  Medication Side Effects: Occasional bruxism  Family Medical/ Social History: Changes? No  MENTAL HEALTH EXAM:  There were no vitals taken for this visit.There is no height or weight  on file to calculate BMI.  General Appearance: Unable to assess  Eye Contact:  Unable to assess  Speech:  Clear and Coherent and Talkative  Volume:  Normal  Mood:  Euthymic  Affect:  Appropriate  Thought Process:  Goal Directed and Descriptions of Associations: Intact  Orientation:  Full (Time, Place, and Person)  Thought Content: Logical   Suicidal Thoughts:  No  Homicidal Thoughts:  No  Memory:  Immediate;   Fair and Otherwise pretty normal.   Judgement:  Good  Insight:  Good  Psychomotor Activity:  Unable to assess  Concentration:  Concentration: Good and Attention Span: Good  Recall:  Good  Fund of Knowledge: Good  Language: Good  Assets:  Desire for Improvement  ADL's:  Intact  Cognition: WNL  Prognosis:  Good   Most recent pertinent labs: 06/17/2019 CMP   glucose 84, sodium 138, BUN and creatinine are normal, LFTs normal  DIAGNOSES:    ICD-10-CM   1. Bipolar 1 disorder, mixed, moderate ([facility])  F31.62  2. Tardive dyskinesia  G24.01   3. PMDD (premenstrual dysphoric disorder)  F32.81   4. Insomnia, unspecified type  G47.00   5. Generalized anxiety disorder  F41.1     Receiving Psychotherapy: Yes Lilia Argue   RECOMMENDATIONS:  PDMP reviewed. I provided 30 minutes of nonface-to-face time during this encounter. [place]hia is back to baseline as far as the mania goes.  Although she is a little depressed she and I both agree not to make any changes right now.  If this episode worsens or lasts longer than a week or 2, call and I will increase the Wellbutrin. For the PMDD, continue Zoloft as planned.  If she starts feeling like she has PMS whether it is a week from now or whenever, go ahead and start the Zoloft daily for no more than 7 days. Continue Zoloft 25 mg, 1 p.o. daily beginning 5 to 7 days before her expected menses is to start.   Continue Vraylar 3 mg qd. Cont Xanax 0.5mg  bid prn. Continue Wellbutrin XL 300 mg, 1 qd. Continue Buspar 15 mg bid. Continue  Trileptal 300 mg, 1 p.o. twice daily. Cont therapy. Order labs at next visit. Return in 4-6 weeks.  Donnal Moat, PA-C

## 2020-02-02 DIAGNOSIS — R053 Chronic cough: Secondary | ICD-10-CM | POA: Diagnosis not present

## 2020-02-05 ENCOUNTER — Ambulatory Visit (INDEPENDENT_AMBULATORY_CARE_PROVIDER_SITE_OTHER): Payer: Medicare Other

## 2020-02-05 DIAGNOSIS — J309 Allergic rhinitis, unspecified: Secondary | ICD-10-CM

## 2020-02-17 DIAGNOSIS — R21 Rash and other nonspecific skin eruption: Secondary | ICD-10-CM | POA: Diagnosis not present

## 2020-02-17 DIAGNOSIS — R053 Chronic cough: Secondary | ICD-10-CM | POA: Diagnosis not present

## 2020-02-23 ENCOUNTER — Ambulatory Visit (INDEPENDENT_AMBULATORY_CARE_PROVIDER_SITE_OTHER): Payer: Medicare Other | Admitting: *Deleted

## 2020-02-23 DIAGNOSIS — J309 Allergic rhinitis, unspecified: Secondary | ICD-10-CM | POA: Diagnosis not present

## 2020-02-27 DIAGNOSIS — Z6821 Body mass index (BMI) 21.0-21.9, adult: Secondary | ICD-10-CM | POA: Diagnosis not present

## 2020-02-27 DIAGNOSIS — Z124 Encounter for screening for malignant neoplasm of cervix: Secondary | ICD-10-CM | POA: Diagnosis not present

## 2020-03-08 ENCOUNTER — Other Ambulatory Visit: Payer: Self-pay

## 2020-03-08 ENCOUNTER — Encounter: Payer: Self-pay | Admitting: Allergy and Immunology

## 2020-03-08 ENCOUNTER — Ambulatory Visit (INDEPENDENT_AMBULATORY_CARE_PROVIDER_SITE_OTHER): Payer: Medicare Other | Admitting: Allergy and Immunology

## 2020-03-08 VITALS — BP 88/54 | HR 76 | Resp 8

## 2020-03-08 DIAGNOSIS — J309 Allergic rhinitis, unspecified: Secondary | ICD-10-CM | POA: Diagnosis not present

## 2020-03-08 DIAGNOSIS — K219 Gastro-esophageal reflux disease without esophagitis: Secondary | ICD-10-CM | POA: Diagnosis not present

## 2020-03-08 DIAGNOSIS — J4520 Mild intermittent asthma, uncomplicated: Secondary | ICD-10-CM

## 2020-03-08 DIAGNOSIS — Z91018 Allergy to other foods: Secondary | ICD-10-CM

## 2020-03-08 DIAGNOSIS — J3089 Other allergic rhinitis: Secondary | ICD-10-CM

## 2020-03-08 MED ORDER — EPINEPHRINE 0.3 MG/0.3ML IJ SOAJ: INTRAMUSCULAR | 3 refills | Status: DC

## 2020-03-08 MED ORDER — OMEPRAZOLE 40 MG PO CPDR: DELAYED_RELEASE_CAPSULE | ORAL | 5 refills | Status: DC

## 2020-03-08 NOTE — Progress Notes (Signed)
[place]   Follow-up Note  Referring Provider: Bartholome Bill, MD Primary Provider: Bartholome Bill, MD Date of Office Visit: 03/08/2020  Subjective:   Candace Browning (DOB: 18-May-1980) is a 39 y.o. female who returns to the [facility] on 03/08/2020 in re-evaluation of the following:  HPI: Candace Browning returns to this clinic in reevaluation of asthma and allergic rhinitis and allergic conjunctivitis and history of food allergy directed against peanuts and tree nuts.  Her last visit to this clinic was 14 April 2019.  She continues to use immunotherapy currently at every 2 weeks which has resulted in dramatic improvement regarding her multiorgan atopic disease.  She can go through each season of the year at this point in time with no problem at all.  She rarely uses a short acting bronchodilator and can exercise without any difficulty.  She has not required a systemic steroid or an antibiotic for any type of airway issue.    However, she has noticed that she has been developing a cough since August.  This is a cough that is dry and is associated with gagging and retching and she almost vomits with this cough.  She has been having heartburn in her upper throat and upper chest region.  She does not have any associated shortness of breath or chest tightness or chest pain or fever or sputum production.  Apparently she received prednisone for a rash recently and her cough resolved.  She does drink 1 caffeinated drink per day and has coffee on a daily basis.  She remains away from consuming peanuts and tree nuts.  She has received 2 Moderna Covid vaccines but has not received the flu vaccine to date.  Allergies as of 03/08/2020      Reactions   Apple Anaphylaxis   Coconut Oil    Gluten Meal    All breads   Latex    Nsaids Other (See Comments)   Thin basement membrane disease, advised to avoid NSAIDS   Other    PT HAD  FOOD ALLERGIES:APPLES, WHEAT, AND all nuts   Peanut-containing Drug Products    Sulfa Antibiotics    Hydrogen Peroxide Itching, Rash   Penicillins Rash   Has patient had a PCN reaction causing immediate rash, facial/tongue/throat swelling, SOB or lightheadedness with hypotension: yes  Has patient had a PCN reaction causing severe rash involving mucus membranes or skin necrosis: no Has patient had a PCN reaction that required hospitalization: no  Has patient had a PCN reaction occurring within the last 10 years: no If all of the above answers are "NO", then may proceed with Cephalosporin use.   Wheat Bran Rash      Medication List    albuterol 108 (90 Base) MCG/ACT inhaler Commonly known as: VENTOLIN HFA Inhale 2 puffs into the lungs every 6 (six) hours as needed.   ALPRAZolam 0.5 MG tablet Commonly known as: Xanax Take 1 tablet (0.5 mg total) by mouth 2 (two) times daily as needed for anxiety.   buPROPion 300 MG 24 hr tablet Commonly known as: WELLBUTRIN XL Take 1 tablet (300 mg total) by mouth daily.   busPIRone 15 MG tablet Commonly known as: BUSPAR Take 1 tablet (15 mg total) by mouth 2 (two) times daily.   cariprazine capsule Commonly known as: Vraylar Take 1 capsule (3 mg total) by mouth daily.   EPINEPHrine 0.3 mg/0.3 mL Soaj injection Commonly known as: EPI-PEN Inject  0.3 mLs (0.3 mg total) into the muscle as needed.   levocetirizine 5 MG tablet Commonly known as: XYZAL Take 5 mg by mouth every evening.   Oxcarbazepine 300 MG tablet Commonly known as: Trileptal Take 1 tablet (300 mg total) by mouth 2 (two) times daily. 1 po bid   sertraline 25 MG tablet Commonly known as: ZOLOFT Take 1 by mouth daily for 5 to 7 days before menses begins.  After menses starts, stop the Zoloft.   valACYclovir 500 MG tablet Commonly known as: VALTREX Take 1 tablet (500 mg total) by mouth 2 (two) times daily as needed (outbreaks).       Past Medical History:  Diagnosis  Date  . Allergic rhinitis   . Anemia   . Anxiety   . Asthma   . Atopic dermatitis   . Bipolar 1 disorder ([place])   . Bipolar 1 disorder, mixed, mild ([place]) 03/29/2017  . Eczema   . Food allergy   . Herpes simplex without mention of complication    HSV2  . Renal disorder     Past Surgical History:  Procedure Laterality Date  . WISDOM TOOTH EXTRACTION      Review of systems negative except as noted in HPI / PMHx or noted below:  Review of Systems  Constitutional: Negative.   HENT: Negative.   Eyes: Negative.   Respiratory: Negative.   Cardiovascular: Negative.   Gastrointestinal: Negative.   Genitourinary: Negative.   Musculoskeletal: Negative.   Skin: Negative.   Neurological: Negative.   Endo/Heme/Allergies: Negative.   Psychiatric/Behavioral: Negative.      Objective:   Vitals:   03/08/20 0832  BP: (!) 88/54  Pulse: 76  Resp: (!) 8  SpO2: 99%          Physical Exam Constitutional:      Appearance: She is not diaphoretic.  HENT:     Head: Normocephalic.     Right Ear: Tympanic membrane, ear canal and external ear normal.     Left Ear: Tympanic membrane, ear canal and external ear normal.     Nose: Nose normal. No mucosal edema or rhinorrhea.     Mouth/Throat:     Mouth: Oropharynx is clear and moist and mucous membranes are normal.     Pharynx: Uvula midline. No oropharyngeal exudate.  Eyes:     Conjunctiva/sclera: Conjunctivae normal.  Neck:     Thyroid: No thyromegaly.     Trachea: Trachea normal. No tracheal tenderness or tracheal deviation.  Cardiovascular:     Rate and Rhythm: Normal rate and regular rhythm.     Heart sounds: Normal heart sounds, S1 normal and S2 normal. No murmur heard.   Pulmonary:     Effort: No respiratory distress.     Breath sounds: Normal breath sounds. No stridor. No wheezing or rales.  Musculoskeletal:        General: No edema.  Lymphadenopathy:     Head:     Right side of head: No tonsillar adenopathy.     Left  side of head: No tonsillar adenopathy.     Cervical: No cervical adenopathy.  Skin:    Findings: No erythema or rash.     Nails: There is no clubbing.  Neurological:     Mental Status: She is alert.     Diagnostics:    Spirometry was performed and demonstrated an FEV1 of 2.58 at 88 % of predicted.  Assessment and Plan:   1. Asthma, mild intermittent, well-controlled   2.  Other allergic rhinitis   3. Food allergy   4. LPRD (laryngopharyngeal reflux disease)     1.  Continue allergen avoidance measures - peanuts / tree nuts  2.  Continue immunotherapy  3.  Continue the following if needed:   A.  Xyzal 5 mg - 1 tablet 1 time per day  B.  EpiPen  4.  In the treatment of cough utilize the following:   A. Omeprazole 40 mg - 1 tablet 2 times per day   B. Minimize caffeine and chocolate consumption  5.  Contact clinic in 2 weeks with update concerning cough. Further evaluation?   Karesha's developed a cough over the course of the past few months and there does not appear to be a history or objective evidence consistent with inflammation of her lower airway and I suspect that this may be an irritant type cough precipitated by her reflux especially given the fact that she has definitely developed heartburn recently.  We will treat her with the therapy noted above and she will contact me in 2 weeks regarding her response to this approach.  We will make a decision about further evaluation and treatment based upon this response.  She will continue on immunotherapy to address her atopic disease which appears to be working quite well.  Allena Katz, MD Allergy / Immunology [place]

## 2020-03-08 NOTE — Patient Instructions (Addendum)
   1.  Continue allergen avoidance measures - peanuts / tree nuts  2.  Continue immunotherapy  3.  Continue the following if needed:   A.  Xyzal 5 mg - 1 tablet 1 time per day  B.  EpiPen  4.  In the treatment of cough utilize the following:   A. Omeprazole 40 mg - 1 tablet 2 times per day   B. Minimize caffeine and chocolate consumption  5.  Contact clinic in 2 weeks with update concerning cough. Further evaluation?

## 2020-03-09 ENCOUNTER — Encounter: Payer: Self-pay | Admitting: Allergy and Immunology

## 2020-03-18 ENCOUNTER — Other Ambulatory Visit: Payer: Self-pay | Admitting: Physician Assistant

## 2020-03-22 ENCOUNTER — Ambulatory Visit (INDEPENDENT_AMBULATORY_CARE_PROVIDER_SITE_OTHER): Payer: Medicare Other

## 2020-03-22 ENCOUNTER — Telehealth: Payer: Self-pay

## 2020-03-22 DIAGNOSIS — J309 Allergic rhinitis, unspecified: Secondary | ICD-10-CM | POA: Diagnosis not present

## 2020-03-22 MED ORDER — PREDNISONE 10 MG PO TABS: ORAL_TABLET | ORAL | 0 refills | Status: DC

## 2020-03-22 MED ORDER — FAMOTIDINE 40 MG PO TABS: 40.0000 mg | ORAL_TABLET | Freq: Every day | ORAL | 5 refills | Status: DC

## 2020-03-22 MED ORDER — BUDESONIDE-FORMOTEROL FUMARATE 160-4.5 MCG/ACT IN AERO: INHALATION_SPRAY | RESPIRATORY_TRACT | 5 refills | Status: DC

## 2020-03-22 NOTE — Telephone Encounter (Signed)
Patient had recent chest x-ray done at a [facility] facility.  Results are in Care Everywhere. Please advise

## 2020-03-22 NOTE — Telephone Encounter (Signed)
Patient informed. Will send in new prescriptions.  I did ask patient to keep [place] updated.

## 2020-03-22 NOTE — Addendum Note (Signed)
Addended by: Alphonzo Cruise on: 03/22/2020 05:50 PM   Modules accepted: Orders

## 2020-03-22 NOTE — Telephone Encounter (Signed)
Please have Carmine obtain a chest x-ray.

## 2020-03-22 NOTE — Telephone Encounter (Signed)
Lets do several things regarding Candace Browning's cough.  First, have her add famotidine 40 mg in evening to her proton pump inhibitor.  Lets start her on an inhaled steroid using Symbicort 160 - 2 inhalations twice a day using correct inhalation technique.  Lets have her start a low-dose of prednisone at 10 mg 1 time per day for 10 days only.

## 2020-03-22 NOTE — Telephone Encounter (Signed)
Patient was seen two weeks ago and was advised to call/report back on her cough. She reports no improvement and no worsening in her cough.

## 2020-04-07 DIAGNOSIS — Z20822 Contact with and (suspected) exposure to covid-19: Secondary | ICD-10-CM | POA: Diagnosis not present

## 2020-04-21 ENCOUNTER — Other Ambulatory Visit: Payer: Self-pay | Admitting: Physician Assistant

## 2020-04-22 ENCOUNTER — Ambulatory Visit (INDEPENDENT_AMBULATORY_CARE_PROVIDER_SITE_OTHER): Payer: Medicare Other | Admitting: *Deleted

## 2020-04-22 DIAGNOSIS — J309 Allergic rhinitis, unspecified: Secondary | ICD-10-CM | POA: Diagnosis not present

## 2020-05-01 ENCOUNTER — Other Ambulatory Visit: Payer: Self-pay | Admitting: Physician Assistant

## 2020-05-03 ENCOUNTER — Ambulatory Visit (INDEPENDENT_AMBULATORY_CARE_PROVIDER_SITE_OTHER): Payer: Medicare Other | Admitting: Allergy and Immunology

## 2020-05-03 ENCOUNTER — Encounter: Payer: Self-pay | Admitting: Allergy and Immunology

## 2020-05-03 ENCOUNTER — Other Ambulatory Visit: Payer: Self-pay

## 2020-05-03 VITALS — BP 108/72 | HR 76 | Resp 12

## 2020-05-03 DIAGNOSIS — J3089 Other allergic rhinitis: Secondary | ICD-10-CM

## 2020-05-03 DIAGNOSIS — J4540 Moderate persistent asthma, uncomplicated: Secondary | ICD-10-CM | POA: Diagnosis not present

## 2020-05-03 DIAGNOSIS — Z91018 Allergy to other foods: Secondary | ICD-10-CM | POA: Diagnosis not present

## 2020-05-03 DIAGNOSIS — K219 Gastro-esophageal reflux disease without esophagitis: Secondary | ICD-10-CM | POA: Diagnosis not present

## 2020-05-03 DIAGNOSIS — J309 Allergic rhinitis, unspecified: Secondary | ICD-10-CM | POA: Diagnosis not present

## 2020-05-03 NOTE — Progress Notes (Unsigned)
[place]   Follow-up Note  Referring Provider: Bartholome Bill, MD Primary Provider: Bartholome Bill, MD Date of Office Visit: 05/03/2020  Subjective:   Candace Browning (DOB: Feb 06, 1981) is a 40 y.o. female who returns to the [facility] on 05/03/2020 in re-evaluation of the following:  HPI: Candace Browning returns to this clinic in evaluation of asthma and allergic rhinoconjunctivitis and history of food allergy directed against peanut and tree nut. Her last visit to this clinic was 08 March 2020.  During her last visit we addressed an issue that she had developed with cough since August 2021. We treated her with a collection of anti-inflammatory agents and therapy directed against reflux given the fact that she was having gagging and retching and almost vomited with this cough and was having heartburn. Unfortunately, because of some insurance issues she never received Symbicort but she did complete a low-dose of systemic steroids at 10 mg of prednisone a day for 10 days and was able to use omeprazole and famotidine.  She believes that she is about 50% better regarding her cough but she still coughing and is still disturbs her sleep somewhat. She still has a sensation of mucus in her throat. Her heartburn has decreased significantly.  She continues on immunotherapy every 2 weeks without any adverse effect.  She remains away from consumption of peanuts and tree nuts.  Allergies as of 05/03/2020      Reactions   Apple Anaphylaxis   Coconut Oil    Gluten Meal    All breads   Latex    Nsaids Other (See Comments)   Thin basement membrane disease, advised to avoid NSAIDS   Other    PT HAD FOOD ALLERGIES:APPLES, WHEAT, AND all nuts   Peanut-containing Drug Products    Sulfa Antibiotics    Hydrogen Peroxide Itching, Rash   Penicillins Rash   Has patient had a PCN reaction causing immediate rash, facial/tongue/throat  swelling, SOB or lightheadedness with hypotension: yes  Has patient had a PCN reaction causing severe rash involving mucus membranes or skin necrosis: no Has patient had a PCN reaction that required hospitalization: no  Has patient had a PCN reaction occurring within the last 10 years: no If all of the above answers are "NO", then may proceed with Cephalosporin use.   Wheat Bran Rash      Medication List    albuterol 108 (90 Base) MCG/ACT inhaler Commonly known as: VENTOLIN HFA Inhale 2 puffs into the lungs every 6 (six) hours as needed.   ALPRAZolam 0.5 MG tablet Commonly known as: Xanax Take 1 tablet (0.5 mg total) by mouth 2 (two) times daily as needed for anxiety.   budesonide-formoterol 160-4.5 MCG/ACT inhaler Commonly known as: Symbicort Inhale two puffs twice daily to prevent cough or wheeze.  Rinse, gargle, and spit after use.   buPROPion 300 MG 24 hr tablet Commonly known as: WELLBUTRIN XL TAKE 1 TABLET(300 MG) BY MOUTH DAILY   busPIRone 15 MG tablet Commonly known as: BUSPAR Take 1 tablet (15 mg total) by mouth 2 (two) times daily.   EPINEPHrine 0.3 mg/0.3 mL Soaj injection Commonly known as: EPI-PEN Use as directed for life-threatening allergic reaction.   famotidine 40 MG tablet Commonly known as: PEPCID Take 1 tablet (40 mg total) by mouth at bedtime.   levocetirizine 5 MG tablet Commonly known as: XYZAL Take 5 mg by mouth every evening.   omeprazole 40 MG  capsule Commonly known as: PRILOSEC Take one capsule twice daily as directed.   Oxcarbazepine 300 MG tablet Commonly known as: TRILEPTAL TAKE 1 TABLET(300 MG) BY MOUTH TWICE DAILY   sertraline 25 MG tablet Commonly known as: ZOLOFT Take 1 by mouth daily for 5 to 7 days before menses begins.  After menses starts, stop the Zoloft.   valACYclovir 500 MG tablet Commonly known as: VALTREX Take 1 tablet (500 mg total) by mouth 2 (two) times daily as needed (outbreaks).   Vraylar capsule Generic  drug: cariprazine TAKE 1 CAPSULE(3 MG) BY MOUTH DAILY       Past Medical History:  Diagnosis Date  . Allergic rhinitis   . Anemia   . Anxiety   . Asthma   . Atopic dermatitis   . Bipolar 1 disorder ([place])   . Bipolar 1 disorder, mixed, mild ([place]) 03/29/2017  . Eczema   . Food allergy   . Herpes simplex without mention of complication    HSV2  . Renal disorder     Past Surgical History:  Procedure Laterality Date  . WISDOM TOOTH EXTRACTION      Review of systems negative except as noted in HPI / PMHx or noted below:  Review of Systems  Constitutional: Negative.   HENT: Negative.   Eyes: Negative.   Respiratory: Negative.   Cardiovascular: Negative.   Gastrointestinal: Negative.   Genitourinary: Negative.   Musculoskeletal: Negative.   Skin: Negative.   Neurological: Negative.   Endo/Heme/Allergies: Negative.   Psychiatric/Behavioral: Negative.      Objective:   Vitals:   05/03/20 1346  BP: 108/72  Pulse: 76  Resp: 12  SpO2: 99%          Physical Exam Constitutional:      Appearance: She is not diaphoretic.  HENT:     Head: Normocephalic.     Right Ear: Tympanic membrane, ear canal and external ear normal.     Left Ear: Tympanic membrane, ear canal and external ear normal.     Nose: Nose normal. No mucosal edema or rhinorrhea.     Mouth/Throat:     Mouth: Oropharynx is clear and moist and mucous membranes are normal.     Pharynx: Uvula midline. No oropharyngeal exudate.  Eyes:     Conjunctiva/sclera: Conjunctivae normal.  Neck:     Thyroid: No thyromegaly.     Trachea: Trachea normal. No tracheal tenderness or tracheal deviation.  Cardiovascular:     Rate and Rhythm: Normal rate and regular rhythm.     Heart sounds: Normal heart sounds, S1 normal and S2 normal. No murmur heard.   Pulmonary:     Effort: No respiratory distress.     Breath sounds: Normal breath sounds. No stridor. No wheezing or rales.  Musculoskeletal:        General: No  edema.  Lymphadenopathy:     Head:     Right side of head: No tonsillar adenopathy.     Left side of head: No tonsillar adenopathy.     Cervical: No cervical adenopathy.  Skin:    Findings: No erythema or rash.     Nails: There is no clubbing.  Neurological:     Mental Status: She is alert.     Diagnostics:    Spirometry was performed and demonstrated an FEV1 of 2.37 at 81 % of predicted.  Results of a chest x-ray obtained 14 December 2019 identified the following:  Redemonstration of a thin walled left apical bleb. Slight  hyperinflation, could reflect inspiratory effort or asthma. No  consolidation, features of edema, pneumothorax, or effusion. The  cardiomediastinal contours are unremarkable. No acute osseous or  soft tissue abnormality.    Assessment and Plan:   1. Not well controlled moderate persistent asthma   2. Other allergic rhinitis   3. Food allergy   4. LPRD (laryngopharyngeal reflux disease)   5. Allergic rhinitis, unspecified seasonality, unspecified trigger     1.  Continue allergen avoidance measures - peanuts / tree nuts  2. Treat and prevent inflammation with the following:   A. Immunotherapy  B. Symbicort 160 -2 inhalations twice a day with spacer  3. Treat and prevent reflux / LPR:   A. Omeprazole 40 mg -1 tablet twice a day  B. Famotidine 40 mg -1 tablet in evening  4.  Continue the following if needed:   A.  Xyzal 5 mg - 1 tablet 1 time per day  B.  EpiPen  C.  Albuterol HFA -2 inhalations every 4-6 hours  5. Return to clinic in 12 weeks or earlier if problem  I am going to have Carmine utilize anti-inflammatory agents for her airway as noted above and aggressive therapy directed against reflux to address both inflammation and reflux induced respiratory disease contributing to her cough. Fortunately, she is about 50% better from her last visit so we seem to be on the right track regarding improvement of her issues. Her chest x-ray  obtained after a month of this persistent cough is not very worrisome. We will see her back in this clinic in 12 weeks and if she is doing well we will attempt to taper down some of her medications.  Allena Katz, MD Allergy / Immunology [place]

## 2020-05-03 NOTE — Patient Instructions (Addendum)
   1.  Continue allergen avoidance measures - peanuts / tree nuts  2. Treat and prevent inflammation with the following:   A. Immunotherapy  B. Symbicort 160 -2 inhalations twice a day with spacer  3. Treat and prevent reflux / LPR:   A. Omeprazole 40 mg -1 tablet twice a day  B. Famotidine 40 mg -1 tablet in evening  4.  Continue the following if needed:   A.  Xyzal 5 mg - 1 tablet 1 time per day  B.  EpiPen  C.  Albuterol HFA -2 inhalations every 4-6 hours  5. Return to clinic in 12 weeks or earlier if problem

## 2020-05-04 ENCOUNTER — Encounter: Payer: Self-pay | Admitting: Allergy and Immunology

## 2020-05-05 DIAGNOSIS — R059 Cough, unspecified: Secondary | ICD-10-CM | POA: Diagnosis not present

## 2020-05-05 DIAGNOSIS — R053 Chronic cough: Secondary | ICD-10-CM | POA: Diagnosis not present

## 2020-05-11 DIAGNOSIS — M542 Cervicalgia: Secondary | ICD-10-CM | POA: Diagnosis not present

## 2020-05-24 ENCOUNTER — Ambulatory Visit (INDEPENDENT_AMBULATORY_CARE_PROVIDER_SITE_OTHER): Payer: Medicare Other | Admitting: *Deleted

## 2020-05-24 DIAGNOSIS — J309 Allergic rhinitis, unspecified: Secondary | ICD-10-CM

## 2020-05-25 ENCOUNTER — Encounter: Payer: Self-pay | Admitting: Physician Assistant

## 2020-05-25 ENCOUNTER — Other Ambulatory Visit: Payer: Self-pay

## 2020-05-25 ENCOUNTER — Ambulatory Visit (INDEPENDENT_AMBULATORY_CARE_PROVIDER_SITE_OTHER): Payer: Medicare Other | Admitting: Physician Assistant

## 2020-05-25 DIAGNOSIS — E7489 Other specified disorders of carbohydrate metabolism: Secondary | ICD-10-CM

## 2020-05-25 DIAGNOSIS — G4700 Insomnia, unspecified: Secondary | ICD-10-CM

## 2020-05-25 DIAGNOSIS — F3281 Premenstrual dysphoric disorder: Secondary | ICD-10-CM

## 2020-05-25 DIAGNOSIS — F411 Generalized anxiety disorder: Secondary | ICD-10-CM | POA: Diagnosis not present

## 2020-05-25 DIAGNOSIS — E8889 Other specified metabolic disorders: Secondary | ICD-10-CM | POA: Diagnosis not present

## 2020-05-25 DIAGNOSIS — F3113 Bipolar disorder, current episode manic without psychotic features, severe: Secondary | ICD-10-CM

## 2020-05-25 DIAGNOSIS — G259 Extrapyramidal and movement disorder, unspecified: Secondary | ICD-10-CM | POA: Diagnosis not present

## 2020-05-25 MED ORDER — ALPRAZOLAM 0.5 MG PO TABS: 0.5000 mg | ORAL_TABLET | Freq: Two times a day (BID) | ORAL | 5 refills | Status: DC | PRN

## 2020-05-25 MED ORDER — SERTRALINE HCL 25 MG PO TABS: ORAL_TABLET | ORAL | 0 refills | Status: DC

## 2020-05-25 NOTE — Progress Notes (Signed)
[facility]  Patient ID: Candace Browning,  MRN: [id number]  PCP: Bartholome Bill, MD  Date of Evaluation:05/25/2020 Time spent:45 minutes   Chief Complaint:  Chief Complaint    Manic Behavior      HISTORY/CURRENT STATUS:  For routine med check.  Lost to f/u, forgot to call back and make appt after her last telehealth visit in Nov.   Manic for past couple of months. Spending more, trouble going to sleep, very talkative, not missing work but has been late a few times just because she's shopping and loses track of time, gets grandiose, increased libido. No paranoia or hallucinations.  Patient denies loss of interest in usual activities and is able to enjoy things.  Denies decreased energy or motivation.  Appetite has not changed.  No extreme sadness, tearfulness, or feelings of hopelessness.  Denies any changes in concentration, making decisions or remembering things.  Does get anxious mostly because she feels like her body is buzzing all the time.  The Xanax does help.  Denies suicidal or homicidal thoughts.  She has used the Zoloft a couple of times for PMDD and some of the time help but others not.  She would like to continue this treatment however.  Denies dizziness, syncope, seizures, numbness, tingling, tremor, tics, unsteady gait, slurred speech, confusion. Denies muscle or joint pain, stiffness, or dystonia. Denies unexplained weight loss, frequent infections, or sores that heal slowly.  No polyphagia, polydipsia, or polyuria. Denies visual changes or paresthesias.   Individual Medical History/ Review of Systems: Changes? :No    Past medications for mental health diagnoses include: BuSpar, Xanax, Latuda, Depakote did not work at all,  Vistaril, Lamictal caused SI, Valium, Seroquel, Abilify, Risperdal, lithium, Equetro, Geodon, prazosin,doxazosin, Luvox, Zoloft, Vraylar has TD at higher doses. Wellbutrin, Rexulti, Zyprexa caused confusion and stuttering,  Ingrezza wasn't helpful. Gabapentin unsure if it helped or not. Artane no help, Trileptal at doses higher than 300 mg bid caused severe vomiting and vertigo.   Allergies: Apple, Coconut oil, Gluten meal, Latex, Nsaids, Other, Peanut-containing drug products, Sulfa antibiotics, Hydrogen peroxide, Penicillins, and Wheat bran  Current Medications:  Current Outpatient Medications:  .  albuterol (VENTOLIN HFA) 108 (90 Base) MCG/ACT inhaler, Inhale 2 puffs into the lungs every 6 (six) hours as needed., Disp: 18 g, Rfl: 1 .  budesonide-formoterol (SYMBICORT) 160-4.5 MCG/ACT inhaler, Inhale two puffs twice daily to prevent cough or wheeze.  Rinse, gargle, and spit after use. (Patient taking differently: Inhale two puffs twice daily to prevent cough or wheeze.  Rinse, gargle, and spit after use.), Disp: 10.2 g, Rfl: 5 .  buPROPion (WELLBUTRIN XL) 300 MG 24 hr tablet, TAKE 1 TABLET(300 MG) BY MOUTH DAILY, Disp: 90 tablet, Rfl: 1 .  busPIRone (BUSPAR) 15 MG tablet, Take 1 tablet (15 mg total) by mouth 2 (two) times daily., Disp: 180 tablet, Rfl: 3 .  EPINEPHrine 0.3 mg/0.3 mL IJ SOAJ injection, Use as directed for life-threatening allergic reaction., Disp: 2 each, Rfl: 3 .  famotidine (PEPCID) 40 MG tablet, Take 1 tablet (40 mg total) by mouth at bedtime., Disp: 30 tablet, Rfl: 5 .  levocetirizine (XYZAL) 5 MG tablet, Take 5 mg by mouth every evening., Disp: , Rfl:  .  omeprazole (PRILOSEC) 40 MG capsule, Take one capsule twice daily as directed., Disp: 60 capsule, Rfl: 5 .  Oxcarbazepine (TRILEPTAL) 300 MG tablet, TAKE 1 TABLET(300 MG) BY MOUTH TWICE DAILY, Disp: 180 tablet, Rfl: 0 .  valACYclovir (VALTREX) 500 MG tablet,  Take 1 tablet (500 mg total) by mouth 2 (two) times daily as needed (outbreaks)., Disp: 30 tablet, Rfl: 0 .  VRAYLAR capsule, TAKE 1 CAPSULE(3 MG) BY MOUTH DAILY (Patient taking differently: Take 3 mg by mouth daily.), Disp: 30 capsule, Rfl: 5 .  ALPRAZolam (XANAX) 0.5 MG tablet, Take 1  tablet (0.5 mg total) by mouth 2 (two) times daily as needed for anxiety., Disp: 60 tablet, Rfl: 5 .  sertraline (ZOLOFT) 25 MG tablet, Take 1 by mouth daily for 5 to 7 days before menses begins.  After menses starts, stop the Zoloft., Disp: 60 tablet, Rfl: 0 Medication Side Effects: Occasional bruxism  Family Medical/ Social History: Changes? No  MENTAL HEALTH EXAM:  There were no vitals taken for this visit.There is no height or weight on file to calculate BMI.  General Appearance: Casual, Neat and Well Groomed  Eye Contact:  Good  Speech:  Clear and Coherent, Pressured and Talkative  Volume:  Normal  Mood:  Euphoric  Affect:  Appropriate and Inappropriate  Thought Process:  Goal Directed and Descriptions of Associations: Intact  Orientation:  Full (Time, Place, and Person)  Thought Content: Logical   Suicidal Thoughts:  No  Homicidal Thoughts:  No  Memory:  Immediate;   Fair and Otherwise pretty normal.   Judgement:  Good  Insight:  Good  Psychomotor Activity:  Normal  Concentration:  Concentration: Good and Attention Span: Good  Recall:  Good  Fund of Knowledge: Good  Language: Good  Assets:  Desire for Improvement  ADL's:  Intact  Cognition: WNL  Prognosis:  Good   Most recent pertinent labs: 06/17/2019 CMP   glucose 84, sodium 138, BUN and creatinine are normal, LFTs normal  DIAGNOSES:    ICD-10-CM   1. Bipolar disorder, current episode manic without psychotic features, severe ([place])  F31.13   2. PMDD (premenstrual dysphoric disorder)  F32.81   3. Generalized anxiety disorder  F41.1   4. Insomnia, unspecified type  G47.00   5. Extrapyramidal and movement disorder, unspecified  G25.9 CBC with Differential/Platelet    Comprehensive metabolic panel    Hemoglobin A1c    Lipid panel  6. Other specified disorders of carbohydrate metabolism ([place])   E74.89 Hemoglobin A1c  7. Other specified metabolic disorders ([place])   E88.89 Lipid panel    Receiving Psychotherapy: Yes  Lilia Argue   RECOMMENDATIONS:  PDMP reviewed. I provided 45 minutes of face-to-face time during this encounter, including time spent reviewing past notes and charting.  Time was spent in counseling concerning medication changes that need to be made.  She is acutely manic and needs an increased dose of antipsychotic in order to bring her mood back down.  She has been on a higher dose of Vraylar in the past but that caused tardive dyskinesia.  On 3 mg or lower, the TD went away.  I recommend increasing the Vraylar at this time but we will watch for tardive dyskinesia closely and once her mood is more stabilized then we should be able to decrease the dose back down if she is having TD.  She agrees and would like to proceed with this plan. Increase Vraylar to 4.5 mg daily.  Samples for 1.5 mg were given for her to add to her 3 mg pills. Continue Xanax 0.5 mg, 1 p.o. twice daily as needed. Continue Wellbutrin XL 300 mg, 1 p.o. every morning. Continue BuSpar 15 mg, 1 p.o. twice daily. Continue Zoloft 25 mg, 1 p.o. daily for the  5 to 7 days prior to menses. Labs ordered as noted above. (For the record, her insurance would not allow me to order a hemoglobin A1c or lipid panel without a more specific reason.  From the drop-down box I chose carbohydrate metabolism and metabolic disorders.  She does not have either problem that I am aware of.  However these test are necessary because she is on an atypical antipsychotic to rule out things problems.) Continue therapy with Lilia Argue. Return in 4 to 6 weeks.  Donnal Moat, PA-C

## 2020-06-03 ENCOUNTER — Other Ambulatory Visit: Payer: Self-pay | Admitting: Physician Assistant

## 2020-06-07 ENCOUNTER — Ambulatory Visit (INDEPENDENT_AMBULATORY_CARE_PROVIDER_SITE_OTHER): Payer: Medicare Other | Admitting: *Deleted

## 2020-06-07 DIAGNOSIS — J309 Allergic rhinitis, unspecified: Secondary | ICD-10-CM | POA: Diagnosis not present

## 2020-06-07 NOTE — Progress Notes (Signed)
VIALS EXP 06-07-21 

## 2020-06-08 DIAGNOSIS — J301 Allergic rhinitis due to pollen: Secondary | ICD-10-CM | POA: Diagnosis not present

## 2020-06-09 DIAGNOSIS — J3089 Other allergic rhinitis: Secondary | ICD-10-CM | POA: Diagnosis not present

## 2020-06-10 DIAGNOSIS — J3081 Allergic rhinitis due to animal (cat) (dog) hair and dander: Secondary | ICD-10-CM | POA: Diagnosis not present

## 2020-06-21 ENCOUNTER — Ambulatory Visit (INDEPENDENT_AMBULATORY_CARE_PROVIDER_SITE_OTHER): Payer: Medicare Other | Admitting: *Deleted

## 2020-06-21 DIAGNOSIS — J309 Allergic rhinitis, unspecified: Secondary | ICD-10-CM | POA: Diagnosis not present

## 2020-06-24 ENCOUNTER — Telehealth (INDEPENDENT_AMBULATORY_CARE_PROVIDER_SITE_OTHER): Payer: Medicare Other | Admitting: Physician Assistant

## 2020-06-24 ENCOUNTER — Encounter: Payer: Self-pay | Admitting: Physician Assistant

## 2020-06-24 DIAGNOSIS — G2401 Drug induced subacute dyskinesia: Secondary | ICD-10-CM

## 2020-06-24 DIAGNOSIS — F3281 Premenstrual dysphoric disorder: Secondary | ICD-10-CM

## 2020-06-24 DIAGNOSIS — F319 Bipolar disorder, unspecified: Secondary | ICD-10-CM | POA: Diagnosis not present

## 2020-06-24 DIAGNOSIS — G4700 Insomnia, unspecified: Secondary | ICD-10-CM | POA: Diagnosis not present

## 2020-06-24 DIAGNOSIS — F411 Generalized anxiety disorder: Secondary | ICD-10-CM

## 2020-06-24 MED ORDER — SERTRALINE HCL 25 MG PO TABS: ORAL_TABLET | ORAL | 0 refills | Status: DC

## 2020-06-24 MED ORDER — OXCARBAZEPINE 300 MG PO TABS: ORAL_TABLET | ORAL | 1 refills | Status: DC

## 2020-06-24 NOTE — Progress Notes (Signed)
[facility]  Patient ID: Candace Browning,  MRN: [id number]  PCP: Bartholome Bill, MD  Date of Evaluation: 06/24/2020 Time spent:40 minutes  Chief Complaint:    Virtual Visit via Telehealth  I connected with patient by a video enabled telemedicine application with their informed consent, and verified patient privacy and that I am speaking with the correct person using two identifiers.  I am private, in my office and the patient is at home.  I discussed the limitations, risks, security and privacy concerns of performing an evaluation and management service by video and the availability of in person appointments. I also discussed with the patient that there may be a patient responsible charge related to this service. The patient expressed understanding and agreed to proceed.   I discussed the assessment and treatment plan with the patient. The patient was provided an opportunity to ask questions and all were answered. The patient agreed with the plan and demonstrated an understanding of the instructions.   The patient was advised to call back or seek an in-person evaluation if the symptoms worsen or if the condition fails to improve as anticipated.  I provided 40  minutes of non-face-to-face time during this encounter.   HISTORY/CURRENT STATUS: HPI for 1 month med check.  At the last visit we increased Vraylar up to 4.5 mg.  She had been manic.  She took this dose for approximately 2 to 3 weeks and started having tardive dyskinesia again so she decreased back to 3 mg.  TD resolved.  Her mood is stable now.  She has had trouble sleeping but last night she took a Xanax before bed, went to sleep easily and stayed asleep.  Feels much more rested today.  Zoloft premenstrually is helping a lot.  When she remembers to take it.  She is going to write herself a note and put it on her mirror so she will not forget to take the Zoloft the next month.  She is not experiencing any  manic symptoms now.  Energy and talkativeness are more back to normal.  No impulsivity or risky behavior.  No grandiosity.  No increased libido or spending.  No hallucinations.  Patient denies loss of interest in usual activities and is able to enjoy things.  Denies decreased energy or motivation.  Appetite has not changed.  No extreme sadness, tearfulness, or feelings of hopelessness.  Denies any changes in concentration, making decisions or remembering things.  Works at [employer] and is not missing days.  Denies suicidal or homicidal thoughts.  Denies dizziness, syncope, seizures, numbness, tingling, tremor, tics, unsteady gait, slurred speech, confusion. Denies muscle or joint pain, stiffness, or dystonia.  Individual Medical History/ Review of Systems: Changes? :No   Past medications for mental health diagnoses include: BuSpar, Xanax, Latuda, Depakotedid not work at all,Vistaril, Lamictal caused SI, Valium, Seroquel, Abilify, Risperdal, lithium, Equetro, Geodon, prazosin,doxazosin, Luvox, Zoloft, Vraylar has TD at higher doses. Wellbutrin, Rexulti, Zyprexa caused confusion and stuttering, Ingrezza wasn't helpful. Gabapentin unsure if it helped or not. Artane no help, Trileptal at doses higher than 300 mg bid caused severe vomiting and vertigo.   Allergies: Apple, Coconut oil, Gluten meal, Latex, Nsaids, Other, Peanut-containing drug products, Sulfa antibiotics, Hydrogen peroxide, Penicillins, and Wheat bran  Current Medications:  Current Outpatient Medications:  .  albuterol (VENTOLIN HFA) 108 (90 Base) MCG/ACT inhaler, Inhale 2 puffs into the lungs every 6 (six) hours as needed., Disp: 18 g, Rfl: 1 .  ALPRAZolam (XANAX) 0.5 MG  tablet, Take 1 tablet (0.5 mg total) by mouth 2 (two) times daily as needed for anxiety., Disp: 60 tablet, Rfl: 5 .  budesonide-formoterol (SYMBICORT) 160-4.5 MCG/ACT inhaler, Inhale two puffs twice daily to prevent cough or wheeze.  Rinse, gargle, and spit after use.  (Patient taking differently: Inhale two puffs twice daily to prevent cough or wheeze.  Rinse, gargle, and spit after use.), Disp: 10.2 g, Rfl: 5 .  buPROPion (WELLBUTRIN XL) 300 MG 24 hr tablet, TAKE 1 TABLET(300 MG) BY MOUTH DAILY, Disp: 90 tablet, Rfl: 1 .  busPIRone (BUSPAR) 15 MG tablet, TAKE 1 TABLET BY MOUTH TWICE DAILY, Disp: 180 tablet, Rfl: 0 .  EPINEPHrine 0.3 mg/0.3 mL IJ SOAJ injection, Use as directed for life-threatening allergic reaction., Disp: 2 each, Rfl: 3 .  famotidine (PEPCID) 40 MG tablet, Take 1 tablet (40 mg total) by mouth at bedtime., Disp: 30 tablet, Rfl: 5 .  levocetirizine (XYZAL) 5 MG tablet, Take 5 mg by mouth every evening., Disp: , Rfl:  .  omeprazole (PRILOSEC) 40 MG capsule, Take one capsule twice daily as directed., Disp: 60 capsule, Rfl: 5 .  Oxcarbazepine (TRILEPTAL) 300 MG tablet, TAKE 1 TABLET(300 MG) BY MOUTH TWICE DAILY, Disp: 180 tablet, Rfl: 1 .  sertraline (ZOLOFT) 25 MG tablet, Take 1 by mouth daily for 5 to 7 days before menses begins.  After menses starts, stop the Zoloft., Disp: 60 tablet, Rfl: 0 .  valACYclovir (VALTREX) 500 MG tablet, Take 1 tablet (500 mg total) by mouth 2 (two) times daily as needed (outbreaks)., Disp: 30 tablet, Rfl: 0 .  VRAYLAR capsule, TAKE 1 CAPSULE(3 MG) BY MOUTH DAILY (Patient taking differently: Take 3 mg by mouth daily.), Disp: 30 capsule, Rfl: 5 Medication Side Effects: none  Family Medical/ Social History: Changes? None  MENTAL HEALTH EXAM:  There were no vitals taken for this visit.There is no height or weight on file to calculate BMI.  General Appearance: Casual, Neat and Well Groomed  Eye Contact:  Good  Speech:  Clear and Coherent and Normal Rate  Volume:  Normal  Mood:  Euthymic  Affect:  Appropriate  Thought Process:  Goal Directed and Descriptions of Associations: Intact  Orientation:  Full (Time, Place, and Person)  Thought Content: Logical   Suicidal Thoughts:  No  Homicidal Thoughts:  No  Memory:   WNL  Judgement:  Good  Insight:  Good  Psychomotor Activity:  Normal  Concentration:  Concentration: Good  Recall:  Good  Fund of Knowledge: Good  Language: Good  Assets:  Desire for Improvement  ADL's:  Intact  Cognition: WNL  Prognosis:  Good    DIAGNOSES:    ICD-10-CM   1. Bipolar I disorder ([place])  F31.9   2. PMDD (premenstrual dysphoric disorder)  F32.81   3. Generalized anxiety disorder  F41.1   4. Insomnia, unspecified type  G47.00   5. Tardive dyskinesia  G24.01     Receiving Psychotherapy: No    RECOMMENDATIONS:  PDMP was reviewed. I provided 40 minutes of nonface-to-face time during this encounter, including time spent in records review and charting. She is doing well so no changes will be made.  Note in the chart under previous med trials that Vraylar at higher doses than 3 mg caused tardive dyskinesia. Continue Xanax 0.5 mg, 1 p.o. twice daily as needed.  She does not take it in the daytime very often.  I recommend she take 1 every night for at least 4 nights here to reset  her "sleep clock."  And then it is okay to take the Xanax nightly if she has to.  Sleep is extremely important for her mental health. Continue Wellbutrin XL 300 mg, 1 p.o. every morning. Continue BuSpar 15 mg, 1 p.o. twice daily. Continue Trileptal 300 mg, 1 p.o. twice daily. Continue Vraylar 3 mg, 1 p.o. daily. Continue Zoloft 25 mg, 1 p.o. daily beginning 5 to 7 days premenstrually, then when menses begins, DC Zoloft. Reminded her to get labs. Continue therapy with Lilia Argue. Return in 2 months.  Donnal Moat, PA-C

## 2020-07-12 ENCOUNTER — Ambulatory Visit (INDEPENDENT_AMBULATORY_CARE_PROVIDER_SITE_OTHER): Payer: Medicare Other | Admitting: *Deleted

## 2020-07-12 DIAGNOSIS — J309 Allergic rhinitis, unspecified: Secondary | ICD-10-CM

## 2020-07-26 ENCOUNTER — Encounter: Payer: Self-pay | Admitting: Allergy and Immunology

## 2020-07-26 ENCOUNTER — Other Ambulatory Visit: Payer: Self-pay

## 2020-07-26 ENCOUNTER — Ambulatory Visit (INDEPENDENT_AMBULATORY_CARE_PROVIDER_SITE_OTHER): Payer: Medicare Other | Admitting: Allergy and Immunology

## 2020-07-26 VITALS — BP 112/78 | HR 84 | Resp 12 | Ht 66.0 in | Wt 143.8 lb

## 2020-07-26 DIAGNOSIS — Z91018 Allergy to other foods: Secondary | ICD-10-CM

## 2020-07-26 DIAGNOSIS — J3089 Other allergic rhinitis: Secondary | ICD-10-CM

## 2020-07-26 DIAGNOSIS — J4540 Moderate persistent asthma, uncomplicated: Secondary | ICD-10-CM

## 2020-07-26 DIAGNOSIS — J309 Allergic rhinitis, unspecified: Secondary | ICD-10-CM

## 2020-07-26 DIAGNOSIS — K219 Gastro-esophageal reflux disease without esophagitis: Secondary | ICD-10-CM

## 2020-07-26 MED ORDER — BREZTRI AEROSPHERE 160-9-4.8 MCG/ACT IN AERO: INHALATION_SPRAY | RESPIRATORY_TRACT | 5 refills | Status: DC

## 2020-07-26 NOTE — Patient Instructions (Addendum)
   1.  Continue allergen avoidance measures - peanuts / tree nuts  2. Treat and prevent inflammation with the following:   A. Immunotherapy  B. Breztri -2 inhalations twice a day with spacer (Symbicort)  3. Treat and prevent reflux / LPR:   A. Omeprazole 40 mg -1 tablet twice a day  B. Famotidine 40 mg -1 tablet in evening  4.  Continue the following if needed:   A.  Xyzal 5 mg - 1 tablet 1 time per day  B.  EpiPen  C.  Albuterol HFA -2 inhalations every 4-6 hours  5. Have ENT look at throat for LPR  6. Return to clinic in 4 weeks or earlier if problem

## 2020-07-26 NOTE — Progress Notes (Signed)
[place]   Follow-up Note  Referring Provider: Bartholome Bill, MD Primary Provider: Bartholome Bill, MD Date of Office Visit: 07/26/2020  Subjective:   Candace Browning (DOB: 1980-09-02) is a 40 y.o. female who returns to the [facility] [place] on 07/26/2020 in re-evaluation of the following:  HPI: Candace Browning returns to this clinic in evaluation of asthma and allergic rhinitis and food allergy and LPR.  Her last visit to this clinic was 03 May 2020.  When I last saw her in this clinic we had treated her for inflammation of her airway with Symbicort and some issues with reflux with a combination of a proton pump inhibitor and H2 receptor blocker and she had resolved all of her cough.  For the past 3 weeks she has had slow progression of return of her cough with some irritated throat.  She has also had a little bit of scratchy voice.  When she uses a short acting bronchodilator she apparently does respond to this cough somewhat.  It is not really disturbing her sleep but it does cause her to gag on occasion.  She has no associated nasal issues and she believes that her reflux is under very good control at this point.  She continues on immunotherapy every 2 weeks without any adverse effect.  She remains away from consumption of peanuts and tree nuts.  Allergies as of 07/26/2020      Reactions   Apple Anaphylaxis   Coconut Oil    Gluten Meal    All breads   Latex    Nsaids Other (See Comments)   Thin basement membrane disease, advised to avoid NSAIDS   Other    PT HAD FOOD ALLERGIES:APPLES, WHEAT, AND all nuts   Peanut-containing Drug Products    Sulfa Antibiotics    Hydrogen Peroxide Itching, Rash   Penicillins Rash   Has patient had a PCN reaction causing immediate rash, facial/tongue/throat swelling, SOB or lightheadedness with hypotension: yes  Has patient had a PCN reaction causing severe rash involving mucus  membranes or skin necrosis: no Has patient had a PCN reaction that required hospitalization: no  Has patient had a PCN reaction occurring within the last 10 years: no If all of the above answers are "NO", then may proceed with Cephalosporin use.   Wheat Bran Rash      Medication List      albuterol 108 (90 Base) MCG/ACT inhaler Commonly known as: VENTOLIN HFA Inhale 2 puffs into the lungs every 6 (six) hours as needed.   ALPRAZolam 0.5 MG tablet Commonly known as: Xanax Take 1 tablet (0.5 mg total) by mouth 2 (two) times daily as needed for anxiety.   budesonide-formoterol 160-4.5 MCG/ACT inhaler Commonly known as: Symbicort Inhale two puffs twice daily to prevent cough or wheeze.  Rinse, gargle, and spit after use.   buPROPion 300 MG 24 hr tablet Commonly known as: WELLBUTRIN XL TAKE 1 TABLET(300 MG) BY MOUTH DAILY   busPIRone 15 MG tablet Commonly known as: BUSPAR TAKE 1 TABLET BY MOUTH TWICE DAILY   EPINEPHrine 0.3 mg/0.3 mL Soaj injection Commonly known as: EPI-PEN Use as directed for life-threatening allergic reaction.   famotidine 40 MG tablet Commonly known as: PEPCID Take 1 tablet (40 mg total) by mouth at bedtime.   levocetirizine 5 MG tablet Commonly known as: XYZAL Take 5 mg by mouth every evening.   omeprazole 40 MG capsule Commonly known as: PRILOSEC  Take one capsule twice daily as directed.   Oxcarbazepine 300 MG tablet Commonly known as: TRILEPTAL TAKE 1 TABLET(300 MG) BY MOUTH TWICE DAILY   sertraline 25 MG tablet Commonly known as: ZOLOFT Take 1 by mouth daily for 5 to 7 days before menses begins.  After menses starts, stop the Zoloft.   valACYclovir 500 MG tablet Commonly known as: VALTREX Take 1 tablet (500 mg total) by mouth 2 (two) times daily as needed (outbreaks).   Vraylar capsule Generic drug: cariprazine TAKE 1 CAPSULE(3 MG) BY MOUTH DAILY What changed: See the new instructions.       Past Medical History:  Diagnosis Date   . Allergic rhinitis   . Anemia   . Anxiety   . Asthma   . Atopic dermatitis   . Bipolar 1 disorder ([place])   . Bipolar 1 disorder, mixed, mild ([place]) 03/29/2017  . Eczema   . Food allergy   . Herpes simplex without mention of complication    HSV2  . Renal disorder     Past Surgical History:  Procedure Laterality Date  . WISDOM TOOTH EXTRACTION      Review of systems negative except as noted in HPI / PMHx or noted below:  Review of Systems  Constitutional: Negative.   HENT: Negative.   Eyes: Negative.   Respiratory: Negative.   Cardiovascular: Negative.   Gastrointestinal: Negative.   Genitourinary: Negative.   Musculoskeletal: Negative.   Skin: Negative.   Neurological: Negative.   Endo/Heme/Allergies: Negative.   Psychiatric/Behavioral: Negative.      Objective:   Vitals:   07/26/20 1347  BP: 112/78  Pulse: 84  Resp: 12  SpO2: 99%   Height: 5\' 6"  (167.6 cm)  Weight: 143 lb 12.8 oz (65.2 kg)   Physical Exam Constitutional:      Appearance: She is not diaphoretic.  HENT:     Head: Normocephalic.     Right Ear: Tympanic membrane, ear canal and external ear normal.     Left Ear: Tympanic membrane, ear canal and external ear normal.     Nose: Nose normal. No mucosal edema or rhinorrhea.     Mouth/Throat:     Pharynx: Uvula midline. No oropharyngeal exudate.  Eyes:     Conjunctiva/sclera: Conjunctivae normal.  Neck:     Thyroid: No thyromegaly.     Trachea: Trachea normal. No tracheal tenderness or tracheal deviation.  Cardiovascular:     Rate and Rhythm: Normal rate and regular rhythm.     Heart sounds: Normal heart sounds, S1 normal and S2 normal. No murmur heard.   Pulmonary:     Effort: No respiratory distress.     Breath sounds: Normal breath sounds. No stridor. No wheezing or rales.  Lymphadenopathy:     Head:     Right side of head: No tonsillar adenopathy.     Left side of head: No tonsillar adenopathy.     Cervical: No cervical adenopathy.   Skin:    Findings: No erythema or rash.     Nails: There is no clubbing.  Neurological:     Mental Status: She is alert.     Diagnostics:    Spirometry was performed and demonstrated an FEV1 of 2.61 at 94 % of predicted.  Assessment and Plan:   1. Not well controlled moderate persistent asthma   2. Other allergic rhinitis   3. Food allergy   4. LPRD (laryngopharyngeal reflux disease)     1.  Continue allergen avoidance measures -  peanuts / tree nuts  2. Treat and prevent inflammation with the following:   A. Immunotherapy  B. Breztri -2 inhalations twice a day with spacer (Symbicort)  3. Treat and prevent reflux / LPR:   A. Omeprazole 40 mg -1 tablet twice a day  B. Famotidine 40 mg -1 tablet in evening  4.  Continue the following if needed:   A.  Xyzal 5 mg - 1 tablet 1 time per day  B.  EpiPen  C.  Albuterol HFA -2 inhalations every 4-6 hours  5. Have ENT look at throat for LPR  6. Return to clinic in 4 weeks or earlier if problem  Cornisha appears to have some kind of irritation to her respiratory tract and there is the possibility that this is inflammation of her lower respiratory tract and will give her a triple inhaler to replace her Symbicort and there is the possibility that this is a mid airway issue involving her Larynex and will have ENT take a look at that part of her airway while she continues to utilize other forms of anti-inflammatory agents including immunotherapy for her atopic disease and continues to treat her reflux/LPR with omeprazole and famotidine.  I will see her back in this clinic in 4 weeks or earlier if there is a problem.  Allena Katz, MD Allergy / Immunology [place]

## 2020-07-27 ENCOUNTER — Encounter: Payer: Self-pay | Admitting: Allergy and Immunology

## 2020-07-27 ENCOUNTER — Other Ambulatory Visit: Payer: Self-pay | Admitting: Physician Assistant

## 2020-07-28 ENCOUNTER — Telehealth: Payer: Self-pay

## 2020-07-28 NOTE — Telephone Encounter (Signed)
Patient has been scheduled with Dr. Gaylyn Cheers at [facility] for Tuesday, June 7th, needing to check-in at 8:45am.  I have sent a MyChart message to patient with this information.   Last two office notes and demographics have been faxed to [facility].

## 2020-08-09 ENCOUNTER — Ambulatory Visit (INDEPENDENT_AMBULATORY_CARE_PROVIDER_SITE_OTHER): Payer: Medicare Other

## 2020-08-09 DIAGNOSIS — J309 Allergic rhinitis, unspecified: Secondary | ICD-10-CM | POA: Diagnosis not present

## 2020-08-23 ENCOUNTER — Ambulatory Visit (INDEPENDENT_AMBULATORY_CARE_PROVIDER_SITE_OTHER): Payer: Medicare Other | Admitting: *Deleted

## 2020-08-23 DIAGNOSIS — J309 Allergic rhinitis, unspecified: Secondary | ICD-10-CM

## 2020-08-24 DIAGNOSIS — J342 Deviated nasal septum: Secondary | ICD-10-CM | POA: Diagnosis not present

## 2020-08-24 DIAGNOSIS — R0989 Other specified symptoms and signs involving the circulatory and respiratory systems: Secondary | ICD-10-CM | POA: Diagnosis not present

## 2020-08-24 DIAGNOSIS — J309 Allergic rhinitis, unspecified: Secondary | ICD-10-CM | POA: Diagnosis not present

## 2020-08-24 DIAGNOSIS — R059 Cough, unspecified: Secondary | ICD-10-CM | POA: Diagnosis not present

## 2020-08-24 DIAGNOSIS — K219 Gastro-esophageal reflux disease without esophagitis: Secondary | ICD-10-CM | POA: Diagnosis not present

## 2020-08-24 DIAGNOSIS — J45991 Cough variant asthma: Secondary | ICD-10-CM | POA: Diagnosis not present

## 2020-08-24 DIAGNOSIS — J45909 Unspecified asthma, uncomplicated: Secondary | ICD-10-CM | POA: Diagnosis not present

## 2020-08-25 ENCOUNTER — Other Ambulatory Visit: Payer: Self-pay

## 2020-08-25 ENCOUNTER — Ambulatory Visit (INDEPENDENT_AMBULATORY_CARE_PROVIDER_SITE_OTHER): Payer: Medicare Other | Admitting: Allergy and Immunology

## 2020-08-25 ENCOUNTER — Encounter: Payer: Self-pay | Admitting: Allergy and Immunology

## 2020-08-25 VITALS — BP 98/64 | HR 90 | Temp 98.2°F | Resp 14

## 2020-08-25 DIAGNOSIS — Z91018 Allergy to other foods: Secondary | ICD-10-CM | POA: Diagnosis not present

## 2020-08-25 DIAGNOSIS — J3089 Other allergic rhinitis: Secondary | ICD-10-CM | POA: Diagnosis not present

## 2020-08-25 DIAGNOSIS — K219 Gastro-esophageal reflux disease without esophagitis: Secondary | ICD-10-CM | POA: Diagnosis not present

## 2020-08-25 DIAGNOSIS — J4540 Moderate persistent asthma, uncomplicated: Secondary | ICD-10-CM

## 2020-08-25 DIAGNOSIS — R059 Cough, unspecified: Secondary | ICD-10-CM | POA: Diagnosis not present

## 2020-08-25 MED ORDER — FAMOTIDINE 40 MG PO TABS: 40.0000 mg | ORAL_TABLET | Freq: Two times a day (BID) | ORAL | 5 refills | Status: DC

## 2020-08-25 NOTE — Patient Instructions (Addendum)
   1.  Continue allergen avoidance measures - peanuts / tree nuts  2. Treat and prevent inflammation with the following:   A. Immunotherapy  B. Breztri - 2 inhalations twice a day with spacer   C. START OTC Nasacort - 1 spray each nostril 1 time per day  3. Treat and prevent reflux / LPR:   A. Omeprazole 40 mg -1 tablet twice a day  B. INCREASE Famotidine 40 mg -1 tablet twice a day  4.  Continue the following if needed:   A.  Xyzal 5 mg - 1 tablet 1 time per day  B.  EpiPen  C.  Albuterol HFA -2 inhalations every 4-6 hours  D.  OTC Mucinex DM - 1-2 tablets 1-2 times per day  5. Return to clinic in 8 weeks or earlier if problem  6. Obtain a chest X-ray

## 2020-08-25 NOTE — Progress Notes (Signed)
[facility]   Follow-up Note  Referring Provider: Bartholome Bill, MD Primary Provider: Bartholome Bill, MD Date of Office Visit: 08/25/2020  Subjective:   Candace Browning (DOB: [birth year]) is a 40 y.o. female who returns to the [facility] [place] on 08/25/2020 in re-evaluation of the following:  HPI: [name] returns to this clinic in evaluation of asthma and allergic rhinitis and food allergy and LPR and a persistent cough.  Her last visit to this clinic was 26 Jul 2020.  Since February 2022 she has been dealing with a persistent cough in the face of apparently good control of her asthma, apparently good control of her reflux, and minimal upper airway symptoms.  She has no issues with shortness of breath or chest tightness or wheezing or needsto use a short acting bronchodilator.  She continues on a triple inhaler and immunotherapy.  She has no problems with burning in her chest but she does occasionally have some belching and regurgitation.  She eats a relatively light meal if she is at work at around 9:45 at night and when she is at home she will eat a heavier meal at around 7:45 at night.  There is at least 2 hours between her meal and the time that she lays down to go to sleep.  She does not consume any caffeine.  She continues to use proton pump inhibitor and H2 receptor blocker.  She has no issues with her nose and has no nasal congestion or anosmia or ugly nasal discharge.  Her last chest x-ray was September 2021.  She recently had evaluation with ENT including rhinoscopic exam and was told that her throat is okay.  She remains away from peanut and tree nut consumption.  Allergies as of 08/25/2020       Reactions   Apple Anaphylaxis   Coconut Oil    Gluten Meal    All breads   Latex    Nsaids Other (See Comments)   Thin basement membrane disease, advised to avoid NSAIDS   Other    PT HAD FOOD  ALLERGIES:APPLES, WHEAT, AND all nuts   Peanut-containing Drug Products    Sulfa Antibiotics    Hydrogen Peroxide Itching, Rash   Penicillins Rash   Has patient had a PCN reaction causing immediate rash, facial/tongue/throat swelling, SOB or lightheadedness with hypotension: yes  Has patient had a PCN reaction causing severe rash involving mucus membranes or skin necrosis: no Has patient had a PCN reaction that required hospitalization: no  Has patient had a PCN reaction occurring within the last 10 years: no If all of the above answers are "NO", then may proceed with Cephalosporin use.   Wheat Bran Rash        Medication List        albuterol 108 (90 Base) MCG/ACT inhaler Commonly known as: VENTOLIN HFA Inhale 2 puffs into the lungs every 6 (six) hours as needed.   ALPRAZolam 0.5 MG tablet Commonly known as: Xanax Take 1 tablet (0.5 mg total) by mouth 2 (two) times daily as needed for anxiety.   Breztri Aerosphere 160-9-4.8 MCG/ACT Aero Generic drug: Budeson-Glycopyrrol-Formoterol Inhale two puffs with spacer twice daily to prevent cough or wheeze.  Rinse, gargle, and spit after use.   budesonide-formoterol 160-4.5 MCG/ACT inhaler Commonly known as: Symbicort Inhale two puffs twice daily to prevent cough or wheeze.  Rinse, gargle, and spit after use.   buPROPion 300 MG 24  hr tablet Commonly known as: WELLBUTRIN XL TAKE 1 TABLET(300 MG) BY MOUTH DAILY   busPIRone 15 MG tablet Commonly known as: BUSPAR TAKE 1 TABLET BY MOUTH TWICE DAILY   EPINEPHrine 0.3 mg/0.3 mL Soaj injection Commonly known as: EPI-PEN Use as directed for life-threatening allergic reaction.   famotidine 40 MG tablet Commonly known as: PEPCID Take 1 tablet (40 mg total) by mouth at bedtime.   levocetirizine 5 MG tablet Commonly known as: XYZAL Take 5 mg by mouth every evening.   omeprazole 40 MG capsule Commonly known as: PRILOSEC Take one capsule twice daily as directed.   Oxcarbazepine  300 MG tablet Commonly known as: TRILEPTAL TAKE 1 TABLET(300 MG) BY MOUTH TWICE DAILY   sertraline 25 MG tablet Commonly known as: ZOLOFT Take 1 by mouth daily for 5 to 7 days before menses begins.  After menses starts, stop the Zoloft.   valACYclovir 500 MG tablet Commonly known as: VALTREX Take 1 tablet (500 mg total) by mouth 2 (two) times daily as needed (outbreaks).   Vraylar 3 MG capsule Generic drug: cariprazine TAKE 1 CAPSULE(3 MG) BY MOUTH DAILY What changed: See the new instructions.        Past Medical History:  Diagnosis Date   Allergic rhinitis    Anemia    Anxiety    Asthma    Atopic dermatitis    Bipolar 1 disorder (HCC)    Bipolar 1 disorder, mixed, mild ([place]) 03/29/2017   Eczema    Food allergy    Herpes simplex without mention of complication    HSV2   Renal disorder     Past Surgical History:  Procedure Laterality Date   WISDOM TOOTH EXTRACTION      Review of systems negative except as noted in HPI / PMHx or noted below:  Review of Systems  Constitutional: Negative.   HENT: Negative.    Eyes: Negative.   Respiratory: Negative.    Cardiovascular: Negative.   Gastrointestinal: Negative.   Genitourinary: Negative.   Musculoskeletal: Negative.   Skin: Negative.   Neurological: Negative.   Endo/Heme/Allergies: Negative.   Psychiatric/Behavioral: Negative.      Objective:   Vitals:   08/25/20 1007  BP: 98/64  Pulse: 90  Resp: 14  Temp: 98.2 F (36.8 C)  SpO2: 98%          Physical Exam Constitutional:      Appearance: She is not diaphoretic.  HENT:     Head: Normocephalic.     Right Ear: Tympanic membrane, ear canal and external ear normal.     Left Ear: Tympanic membrane, ear canal and external ear normal.     Nose: Nose normal. No mucosal edema or rhinorrhea.     Mouth/Throat:     Pharynx: Uvula midline. No oropharyngeal exudate.  Eyes:     Conjunctiva/sclera: Conjunctivae normal.  Neck:     Thyroid: No thyromegaly.      Trachea: Trachea normal. No tracheal tenderness or tracheal deviation.  Cardiovascular:     Rate and Rhythm: Normal rate and regular rhythm.     Heart sounds: Normal heart sounds, S1 normal and S2 normal. No murmur heard. Pulmonary:     Effort: No respiratory distress.     Breath sounds: Normal breath sounds. No stridor. No wheezing or rales.  Lymphadenopathy:     Head:     Right side of head: No tonsillar adenopathy.     Left side of head: No tonsillar adenopathy.  Cervical: No cervical adenopathy.  Skin:    Findings: No erythema or rash.     Nails: There is no clubbing.  Neurological:     Mental Status: She is alert.    Diagnostics:    Spirometry was performed and demonstrated an FEV1 of 2.77 at 100 % of predicted.  Assessment and Plan:   1. Asthma, moderate persistent, well-controlled   2. Other allergic rhinitis   3. LPRD (laryngopharyngeal reflux disease)   4. Food allergy     1.  Continue allergen avoidance measures - peanuts / tree nuts  2. Treat and prevent inflammation with the following:   A. Immunotherapy  B. Breztri - 2 inhalations twice a day with spacer   C. START OTC Nasacort - 1 spray each nostril 1 time per day  3. Treat and prevent reflux / LPR:   A. Omeprazole 40 mg -1 tablet twice a day  B. INCREASE Famotidine 40 mg -1 tablet twice a day  4.  Continue the following if needed:   A.  Xyzal 5 mg - 1 tablet 1 time per day  B.  EpiPen  C.  Albuterol HFA -2 inhalations every 4-6 hours  D.  OTC Mucinex DM - 1-2 tablets 1-2 times per day  5. Return to clinic in 8 weeks or earlier if problem  6. Obtain a chest X-ray  Emmagrace still has a cough.  We need to obtain a chest x-ray to screen for the possibility of some mediastinal or lung parenchymal issue driving some of her cough and we will have her use a nasal steroid to help with any upper airway inflammation.  In the past she has been somewhat hesitant to use a nasal steroid.  And she will  increase her therapy for LPR with an additional dose of famotidine and she can use some over-the-counter Mucinex DM should it be required.  Allena Katz, MD Allergy / Immunology [place]

## 2020-08-26 ENCOUNTER — Encounter: Payer: Self-pay | Admitting: Allergy and Immunology

## 2020-08-31 ENCOUNTER — Other Ambulatory Visit: Payer: Self-pay

## 2020-08-31 ENCOUNTER — Ambulatory Visit (INDEPENDENT_AMBULATORY_CARE_PROVIDER_SITE_OTHER): Payer: Medicare Other | Admitting: Physician Assistant

## 2020-08-31 ENCOUNTER — Encounter: Payer: Self-pay | Admitting: Physician Assistant

## 2020-08-31 DIAGNOSIS — R053 Chronic cough: Secondary | ICD-10-CM

## 2020-08-31 DIAGNOSIS — F319 Bipolar disorder, unspecified: Secondary | ICD-10-CM | POA: Diagnosis not present

## 2020-08-31 DIAGNOSIS — F3281 Premenstrual dysphoric disorder: Secondary | ICD-10-CM | POA: Diagnosis not present

## 2020-08-31 DIAGNOSIS — F411 Generalized anxiety disorder: Secondary | ICD-10-CM

## 2020-08-31 DIAGNOSIS — G4700 Insomnia, unspecified: Secondary | ICD-10-CM | POA: Diagnosis not present

## 2020-08-31 MED ORDER — BUSPIRONE HCL 15 MG PO TABS: 15.0000 mg | ORAL_TABLET | Freq: Two times a day (BID) | ORAL | 3 refills | Status: DC

## 2020-08-31 NOTE — Patient Instructions (Signed)
Wean off the Trileptal by taking 300 mg, 1 every morning and 1/2 pill at night for 4 days then 1/2 pill twice a day for 4 days then 1/2 pill daily for 4 days and then stop.

## 2020-08-31 NOTE — Progress Notes (Signed)
[facility] Check  Patient ID: Candace Browning,  MRN: [id number]  PCP: Bartholome Bill, MD  Date of Evaluation: 09/01/2020 Time spent:40 minutes  Chief Complaint:  Chief Complaint   Follow-up       HISTORY/CURRENT STATUS: HPI for 2 month med check.  Mental health is good. Patient denies loss of interest in usual activities and is able to enjoy things.  Denies decreased energy or motivation.  Appetite has not changed.  No extreme sadness, tearfulness, or feelings of hopelessness.  Denies any changes in concentration, making decisions or remembering things.  Anxiety is well-controlled. Sleeps well. Works at [employer] and it is going fine. Denies suicidal or homicidal thoughts.  Patient denies increased energy with decreased need for sleep, no increased talkativeness, no racing thoughts, no impulsivity or risky behaviors, no increased spending, no increased libido, no grandiosity, no increased irritability or anger, no paranoia, and no hallucinations.  Still has PMS pretty bad but has had a difficult couple of months just because she felt like she was having severe PMS but then menses never started even after 2 to 3 weeks.  She went about 6 weeks in between cycles.  So that makes it hard to know when to use the Zoloft.  Not sure if it is going to help her not.  Denies dizziness, syncope, seizures, numbness, tingling, tremor, tics, unsteady gait, slurred speech, confusion. Denies muscle or joint pain, stiffness, or dystonia.  Individual Medical History/ Review of Systems: Changes? :Yes  chronic cough, for about 10 months. Has seen PCP, Allergist, ENT, has been put on a different inhaler for asthma (but she doesn't think the cough is coming from asthma) and was put on Pepcid and Prilosec.  She does not have any pain from the heartburn like she did but she still belches and feels acid up in the back of her throat but it does not hurt.  None of those treatments have helped with the  cough. One of the specialists asked her about any new medications.  She is wondering about the Trileptal, it seems to be the one drug that was added last.  She has been on it for 1-1/2 years.  The dry cough started about 10 months ago.  Past medications for mental health diagnoses include: BuSpar, Xanax, Latuda, Depakote did not work at all,  Vistaril, Lamictal caused SI, Valium, Seroquel, Abilify, Risperdal, lithium, Equetro, Geodon, prazosin, doxazosin, Luvox, Zoloft, Vraylar has TD at higher doses. Wellbutrin, Rexulti, Zyprexa caused confusion and stuttering, Ingrezza wasn't helpful. Gabapentin unsure if it helped or not. Artane no help, Trileptal at doses higher than 300 mg bid caused severe vomiting and vertigo.   Allergies: Apple, Coconut oil, Gluten meal, Latex, Nsaids, Other, Peanut-containing drug products, Sulfa antibiotics, Hydrogen peroxide, Penicillins, and Wheat bran  Current Medications:  Current Outpatient Medications:    albuterol (VENTOLIN HFA) 108 (90 Base) MCG/ACT inhaler, Inhale 2 puffs into the lungs every 6 (six) hours as needed., Disp: 18 g, Rfl: 1   ALPRAZolam (XANAX) 0.5 MG tablet, Take 1 tablet (0.5 mg total) by mouth 2 (two) times daily as needed for anxiety., Disp: 60 tablet, Rfl: 5   Budeson-Glycopyrrol-Formoterol (BREZTRI AEROSPHERE) 160-9-4.8 MCG/ACT AERO, Inhale two puffs with spacer twice daily to prevent cough or wheeze.  Rinse, gargle, and spit after use., Disp: 10.7 g, Rfl: 5   buPROPion (WELLBUTRIN XL) 300 MG 24 hr tablet, TAKE 1 TABLET(300 MG) BY MOUTH DAILY, Disp: 90 tablet, Rfl: 1   EPINEPHrine 0.3 mg/0.3  mL IJ SOAJ injection, Use as directed for life-threatening allergic reaction., Disp: 2 each, Rfl: 3   famotidine (PEPCID) 40 MG tablet, Take 1 tablet (40 mg total) by mouth in the morning and at bedtime., Disp: 60 tablet, Rfl: 5   levocetirizine (XYZAL) 5 MG tablet, Take 5 mg by mouth every evening., Disp: , Rfl:    omeprazole (PRILOSEC) 40 MG capsule, Take  one capsule twice daily as directed., Disp: 60 capsule, Rfl: 5   sertraline (ZOLOFT) 25 MG tablet, Take 1 by mouth daily for 5 to 7 days before menses begins.  After menses starts, stop the Zoloft., Disp: 60 tablet, Rfl: 0   valACYclovir (VALTREX) 500 MG tablet, Take 1 tablet (500 mg total) by mouth 2 (two) times daily as needed (outbreaks)., Disp: 30 tablet, Rfl: 0   VRAYLAR capsule, TAKE 1 CAPSULE(3 MG) BY MOUTH DAILY (Patient taking differently: Take 3 mg by mouth daily.), Disp: 30 capsule, Rfl: 5   budesonide-formoterol (SYMBICORT) 160-4.5 MCG/ACT inhaler, Inhale two puffs twice daily to prevent cough or wheeze.  Rinse, gargle, and spit after use. (Patient not taking: Reported on 08/31/2020), Disp: 10.2 g, Rfl: 5   busPIRone (BUSPAR) 15 MG tablet, Take 1 tablet (15 mg total) by mouth 2 (two) times daily., Disp: 180 tablet, Rfl: 3 Medication Side Effects: none  Family Medical/ Social History: Changes? None  MENTAL HEALTH EXAM:  There were no vitals taken for this visit.There is no height or weight on file to calculate BMI.  General Appearance: Casual, Neat, Well Groomed, and she does cough a few times during the appointment  Eye Contact:  Good  Speech:  Clear and Coherent and Normal Rate  Volume:  Normal  Mood:  Euthymic  Affect:  Appropriate  Thought Process:  Goal Directed and Descriptions of Associations: Intact  Orientation:  Full (Time, Place, and Person)  Thought Content: Logical   Suicidal Thoughts:  No  Homicidal Thoughts:  No  Memory:  WNL  Judgement:  Good  Insight:  Good  Psychomotor Activity:  Normal and purses her lips just a few times.  Nothing like it was when the TD was really bad.  Concentration:  Concentration: Good  Recall:  Good  Fund of Knowledge: Good  Language: Good  Assets:  Desire for Improvement  ADL's:  Intact  Cognition: WNL  Prognosis:  Good    DIAGNOSES:    ICD-10-CM   1. Bipolar I disorder ([place])  F31.9     2. Generalized anxiety disorder   F41.1     3. PMDD (premenstrual dysphoric disorder)  F32.81     4. Insomnia, unspecified type  G47.00     5. Chronic cough  R05.3        Receiving Psychotherapy: Yes    RECOMMENDATIONS:  PDMP was reviewed. I provided 40 minutes of face to face time during this encounter, including time spent before and after the visit in records review, complex medical decision making, and charting.  We discussed the cough.  I do not think the Trileptal is the cause of the cough, but it will not hurt to wean off and see if the cough will go away. TD can cause some respiratory like symptoms, grunting or throat clearing.  I will discuss this issue with my supervising physician Dr. Lynder Parents.if the cough could possibly be due to TD from the [place], I will add Austedo.  When she took Ingrezza in the past it did not help the TD symptoms of mouth movements.  Decreasing the Vraylar was effective. Call if there is increased irritability, anger and I will restart the oxcarbazepine. Continue Xanax 0.5 mg, 1 p.o. twice daily as needed.  She rarely takes this. Wean off Trileptal by taking 300 mg, 1 p.o. every morning and one half p.o. at night for 4 days then 1/2 pill twice daily for 4 days, then 1/2 pill daily for 4 days and then stop. Continue Wellbutrin XL 300 mg, 1 p.o. every morning. Continue BuSpar 15 mg, 1 p.o. twice daily. Continue Vraylar 3 mg, 1 p.o. daily. Continue Zoloft 25 mg, 1 p.o. daily beginning 5 to 7 days premenstrually, then when menses begins, DC Zoloft. Reminded her to get labs. Continue therapy with Lilia Argue. Return in 2 months.  Donnal Moat, PA-C

## 2020-09-06 ENCOUNTER — Ambulatory Visit (INDEPENDENT_AMBULATORY_CARE_PROVIDER_SITE_OTHER): Payer: Medicare Other | Admitting: *Deleted

## 2020-09-06 DIAGNOSIS — J309 Allergic rhinitis, unspecified: Secondary | ICD-10-CM

## 2020-09-07 ENCOUNTER — Telehealth: Payer: Self-pay | Admitting: Physician Assistant

## 2020-09-07 NOTE — Telephone Encounter (Signed)
I left a message on the patient's voicemail asking her to give me a call. See note from 08/31/2020, and chronic cough. Cough is not typically a symptom of tardive dyskinesia, however a grunting or throat clearing type noise can be.  I discussed this with Dr. Clovis Pu and a physician who is an expert in the field of VMAT 2 inhibitors used to treat TD.  Cough is not as common as throat clearing or a grunting noise, however tardive dyskinesia can cause a chronic, dry hacking cough. When Candace Browning returns my call I would like to discuss putting her on Austedo, and samples to see how she responds. (She was on Ingrezza at some point in the past year for more oral buccal movements but decreasing the atypical antipsychotic helped with the TD, so we stopped the Ingrezza.)

## 2020-09-09 NOTE — Telephone Encounter (Signed)
I spoke with Candace Partridge concerning the cough associated with tardive dyskinesia, recommend Austedo.  She definitely wants to try it.  She will come to the office and pick up samples.

## 2020-09-10 NOTE — Telephone Encounter (Signed)
Austedo 1 month titration pack is in the sample closet for her to pick up.

## 2020-09-14 ENCOUNTER — Other Ambulatory Visit: Payer: Self-pay | Admitting: Physician Assistant

## 2020-09-27 ENCOUNTER — Ambulatory Visit (INDEPENDENT_AMBULATORY_CARE_PROVIDER_SITE_OTHER): Payer: Medicare Other | Admitting: *Deleted

## 2020-09-27 DIAGNOSIS — J309 Allergic rhinitis, unspecified: Secondary | ICD-10-CM

## 2020-10-01 ENCOUNTER — Encounter: Payer: Self-pay | Admitting: *Deleted

## 2020-10-05 ENCOUNTER — Telehealth: Payer: Self-pay | Admitting: Physician Assistant

## 2020-10-05 ENCOUNTER — Other Ambulatory Visit: Payer: Self-pay | Admitting: Physician Assistant

## 2020-10-05 MED ORDER — AUSTEDO 12 MG PO TABS: 24.0000 mg | ORAL_TABLET | Freq: Two times a day (BID) | ORAL | 1 refills | Status: DC

## 2020-10-05 NOTE — Telephone Encounter (Signed)
Pt is on Austedo samples. On week 4 now. Does she continue with samples or get an RX sent in? She has 5 days worth left.

## 2020-10-05 NOTE — Telephone Encounter (Signed)
Rx has been sent  

## 2020-10-05 NOTE — Telephone Encounter (Signed)
Check to see how she's doing on it. Is it helping the cough? If so, I'll make sure she has Rx and/or samples, whatever is needed.

## 2020-10-05 NOTE — Telephone Encounter (Signed)
Please review

## 2020-10-05 NOTE — Telephone Encounter (Signed)
Pt stated she is doing well and it has helped the cough.She would like a rx sent to [facility] in [place]

## 2020-10-07 ENCOUNTER — Telehealth: Payer: Self-pay | Admitting: Physician Assistant

## 2020-10-07 NOTE — Telephone Encounter (Signed)
Next visit is 10/26/20. [facility] called regarding Jalea Bronaugh thru Cover My Meds. Prior Authorization is needed for [facility] Austedo. Per [facility], it was faxed yesterday afternoon. When calling [facility], use prescription # [id number] and store [phone].

## 2020-10-07 NOTE — Telephone Encounter (Signed)
Please review

## 2020-10-08 NOTE — Telephone Encounter (Signed)
Prior Approval received for AUSTEDO 12 MG effective 09/08/2020-10/08/2021, PA# [id number] with [employer] ID# [id number]

## 2020-10-11 ENCOUNTER — Ambulatory Visit (INDEPENDENT_AMBULATORY_CARE_PROVIDER_SITE_OTHER): Payer: Medicare Other | Admitting: *Deleted

## 2020-10-11 DIAGNOSIS — J309 Allergic rhinitis, unspecified: Secondary | ICD-10-CM | POA: Diagnosis not present

## 2020-10-12 NOTE — Telephone Encounter (Signed)
Candace Browning, I contacted [facility] and they transferred her Rx to their [employer] and have mailed it out for her. I did not change pharmacies to confuse the process.

## 2020-10-13 NOTE — Telephone Encounter (Signed)
Noted  

## 2020-10-20 ENCOUNTER — Encounter: Payer: Self-pay | Admitting: Allergy and Immunology

## 2020-10-20 ENCOUNTER — Ambulatory Visit (INDEPENDENT_AMBULATORY_CARE_PROVIDER_SITE_OTHER): Payer: Medicare Other | Admitting: Allergy and Immunology

## 2020-10-20 ENCOUNTER — Other Ambulatory Visit: Payer: Self-pay

## 2020-10-20 VITALS — BP 118/72 | HR 98 | Resp 16

## 2020-10-20 DIAGNOSIS — K219 Gastro-esophageal reflux disease without esophagitis: Secondary | ICD-10-CM | POA: Diagnosis not present

## 2020-10-20 DIAGNOSIS — J4540 Moderate persistent asthma, uncomplicated: Secondary | ICD-10-CM | POA: Diagnosis not present

## 2020-10-20 DIAGNOSIS — J3089 Other allergic rhinitis: Secondary | ICD-10-CM

## 2020-10-20 DIAGNOSIS — Z91018 Allergy to other foods: Secondary | ICD-10-CM

## 2020-10-20 NOTE — Patient Instructions (Signed)
   1.  Continue allergen avoidance measures - peanuts / tree nuts  2. Treat and prevent inflammation with the following:   A. Immunotherapy  B. Breztri - 2 inhalations 1-2 times per day with spacer   C. OTC Nasacort - 1 spray each nostril 1-2 times per day  3. Treat and prevent reflux / LPR:   A. Omeprazole 40 mg -1 tablet twice a day  B. Famotidine 40 mg -1 tablet twice a day  4.  Continue the following if needed:   A.  Xyzal 5 mg - 1 tablet 1 time per day  B.  EpiPen  C.  Albuterol HFA -2 inhalations every 4-6 hours  D.  OTC Mucinex DM - 1-2 tablets 1-2 times per day  5. Return to clinic in December 2022 or earlier if problem  6. Obtain fall flu vaccine

## 2020-10-20 NOTE — Progress Notes (Signed)
[facility]   Follow-up Note  Referring Provider: Bartholome Bill, MD Primary Provider: Bartholome Bill, MD Date of Office Visit: 10/20/2020  Subjective:   Candace Browning (DOB: Aug 05, 1980) is a 40 y.o. female who returns to the [facility] [place] on 10/20/2020 in re-evaluation of the following:  HPI: Louanne returns to this clinic in evaluation of asthma and allergic rhinitis and food allergy directed against peanut and tree nut and LPR.  Her last visit to this clinic was 25 August 2020.  She finally has some control of her persistent cough.  She has had decreased frequency of her cough.  She does not have any need to use a short acting bronchodilator and she has no shortness of breath or chest tightness and she has very little issues with her throat and she does not have any belching or regurgitation or burning in her chest.  Allergies as of 10/20/2020       Reactions   Apple Anaphylaxis   Coconut Oil    Gluten Meal    All breads   Latex    Nsaids Other (See Comments)   Thin basement membrane disease, advised to avoid NSAIDS   Other    PT HAD FOOD ALLERGIES:APPLES, WHEAT, AND all nuts   Peanut-containing Drug Products    Sulfa Antibiotics    Hydrogen Peroxide Itching, Rash   Penicillins Rash   Has patient had a PCN reaction causing immediate rash, facial/tongue/throat swelling, SOB or lightheadedness with hypotension: yes  Has patient had a PCN reaction causing severe rash involving mucus membranes or skin necrosis: no Has patient had a PCN reaction that required hospitalization: no  Has patient had a PCN reaction occurring within the last 10 years: no If all of the above answers are "NO", then may proceed with Cephalosporin use.   Wheat Bran Rash        Medication List    albuterol 108 (90 Base) MCG/ACT inhaler Commonly known as: VENTOLIN HFA Inhale 2 puffs into the lungs every 6 (six) hours as needed.    ALPRAZolam 0.5 MG tablet Commonly known as: Xanax Take 1 tablet (0.5 mg total) by mouth 2 (two) times daily as needed for anxiety.   Austedo 12 MG Tabs Generic drug: Deutetrabenazine Take 24 mg by mouth 2 (two) times daily with a meal.   Breztri Aerosphere 160-9-4.8 MCG/ACT Aero Generic drug: Budeson-Glycopyrrol-Formoterol Inhale two puffs with spacer twice daily to prevent cough or wheeze.  Rinse, gargle, and spit after use.   buPROPion 300 MG 24 hr tablet Commonly known as: WELLBUTRIN XL TAKE 1 TABLET(300 MG) BY MOUTH DAILY   busPIRone 15 MG tablet Commonly known as: BUSPAR Take 1 tablet (15 mg total) by mouth 2 (two) times daily.   EPINEPHrine 0.3 mg/0.3 mL Soaj injection Commonly known as: EPI-PEN Use as directed for life-threatening allergic reaction.   famotidine 40 MG tablet Commonly known as: PEPCID Take 1 tablet (40 mg total) by mouth in the morning and at bedtime.   levocetirizine 5 MG tablet Commonly known as: XYZAL Take 5 mg by mouth every evening.   omeprazole 40 MG capsule Commonly known as: PRILOSEC Take one capsule twice daily as directed.   sertraline 25 MG tablet Commonly known as: ZOLOFT Take 1 by mouth daily for 5 to 7 days before menses begins.  After menses starts, stop the Zoloft.   valACYclovir 500 MG tablet Commonly known as: VALTREX Take 1  tablet (500 mg total) by mouth 2 (two) times daily as needed (outbreaks).   Vraylar 3 MG capsule Generic drug: cariprazine TAKE 1 CAPSULE(3 MG) BY MOUTH DAILY     Past Medical History:  Diagnosis Date   Allergic rhinitis    Anemia    Anxiety    Asthma    Atopic dermatitis    Bipolar 1 disorder (HCC)    Bipolar 1 disorder, mixed, mild ([place]) 03/29/2017   Eczema    Food allergy    Herpes simplex without mention of complication    HSV2   Renal disorder     Past Surgical History:  Procedure Laterality Date   WISDOM TOOTH EXTRACTION      Review of systems negative except as noted in HPI /  PMHx or noted below:  Review of Systems  Constitutional: Negative.   HENT: Negative.    Eyes: Negative.   Respiratory: Negative.    Cardiovascular: Negative.   Gastrointestinal: Negative.   Genitourinary: Negative.   Musculoskeletal: Negative.   Skin: Negative.   Neurological: Negative.   Endo/Heme/Allergies: Negative.   Psychiatric/Behavioral: Negative.      Objective:   Vitals:   10/20/20 1013  BP: 118/72  Pulse: 98  Resp: 16  SpO2: 99%          Physical Exam Constitutional:      Appearance: She is not diaphoretic.  HENT:     Head: Normocephalic.     Right Ear: Tympanic membrane, ear canal and external ear normal.     Left Ear: Tympanic membrane, ear canal and external ear normal.     Nose: Nose normal. No mucosal edema or rhinorrhea.     Mouth/Throat:     Pharynx: Uvula midline. No oropharyngeal exudate.  Eyes:     Conjunctiva/sclera: Conjunctivae normal.  Neck:     Thyroid: No thyromegaly.     Trachea: Trachea normal. No tracheal tenderness or tracheal deviation.  Cardiovascular:     Rate and Rhythm: Normal rate and regular rhythm.     Heart sounds: Normal heart sounds, S1 normal and S2 normal. No murmur heard. Pulmonary:     Effort: No respiratory distress.     Breath sounds: Normal breath sounds. No stridor. No wheezing or rales.  Lymphadenopathy:     Head:     Right side of head: No tonsillar adenopathy.     Left side of head: No tonsillar adenopathy.     Cervical: No cervical adenopathy.  Skin:    Findings: No erythema or rash.     Nails: There is no clubbing.  Neurological:     Mental Status: She is alert.    Diagnostics:    Spirometry was performed and demonstrated an FEV1 of 2.71 at 98 % of predicted.   Assessment and Plan:   1. Asthma, moderate persistent, well-controlled   2. Other allergic rhinitis   3. LPRD (laryngopharyngeal reflux disease)   4. Food allergy     1.  Continue allergen avoidance measures - peanuts / tree  nuts  2. Treat and prevent inflammation with the following:   A. Immunotherapy  B. Breztri - 2 inhalations 1-2 times per day with spacer   C. OTC Nasacort - 1 spray each nostril 1-2 times per day  3. Treat and prevent reflux / LPR:   A. Omeprazole 40 mg -1 tablet twice a day  B. Famotidine 40 mg -1 tablet twice a day  4.  Continue the following if needed:   A.  Xyzal 5 mg - 1 tablet 1 time per day  B.  EpiPen  C.  Albuterol HFA -2 inhalations every 4-6 hours  D.  OTC Mucinex DM - 1-2 tablets 1-2 times per day  5. Return to clinic in December 2022 or earlier if problem  6. Obtain fall flu vaccine  Rudolph is doing relatively well at this point in time and I would like to continue to have her use anti-inflammatory agents for her airway and aggressive therapy directed against LPR over the course of the next several months.  Her Breztri and her Nasacort can be used 1 or 2 times per day depending on disease activity but we need to definitely have her consistently use proton pump inhibitor and H2 receptor blocker twice a day as it did appear that the major driver behind her persistent cough that presented in February 2022 was mostly reflux driven.  Allena Katz, MD Allergy / Immunology [place]

## 2020-10-21 ENCOUNTER — Encounter: Payer: Self-pay | Admitting: Allergy and Immunology

## 2020-10-26 ENCOUNTER — Telehealth: Payer: Self-pay | Admitting: Physician Assistant

## 2020-10-26 ENCOUNTER — Other Ambulatory Visit: Payer: Self-pay | Admitting: Physician Assistant

## 2020-10-26 ENCOUNTER — Ambulatory Visit: Payer: Medicare Other | Admitting: Physician Assistant

## 2020-10-26 NOTE — Telephone Encounter (Signed)
Have her decrease from Austedo 12 mg, 2 po twice daily, to 1 po three times a day. Or she can take 1 in the morning and 2 in evening, just go down from 4 pills/d to 3 per day.

## 2020-10-26 NOTE — Telephone Encounter (Signed)
Please review

## 2020-10-26 NOTE — Telephone Encounter (Signed)
Pt was rs due to teresa being sick. Pt is having a problem with the austedo. She is suppose to be taking 24 mg but she can't function on that dose.However she has been taking the 12 mg and it is working but at times she feels like she is high. Please call her at [phone] and let her know what to do

## 2020-10-26 NOTE — Telephone Encounter (Signed)
Pt wants to know if there is a smaller dose to take because she feels the 12 mg is just too much

## 2020-10-26 NOTE — Telephone Encounter (Signed)
Toni, yes, we can decrease to 9 mg. Until we can get this, have her take the 12 mg,  3 pills a day. Traci I need to send new Rx to [place] right? Will probably need another PA?

## 2020-10-27 ENCOUNTER — Other Ambulatory Visit: Payer: Self-pay | Admitting: Physician Assistant

## 2020-10-27 MED ORDER — AUSTEDO 9 MG PO TABS: 18.0000 mg | ORAL_TABLET | Freq: Two times a day (BID) | ORAL | 1 refills | Status: DC

## 2020-10-27 NOTE — Telephone Encounter (Signed)
Rx was sent  

## 2020-10-27 NOTE — Telephone Encounter (Signed)
LVM with info and to call back with any questions

## 2020-10-27 NOTE — Telephone Encounter (Signed)
No, Candace Browning this is the one that was sent to [facility] instead so their [employer] is doing her Rx's of Austedo.   Usually going down on medication PA's aren't needed but with her insurance I can't say until they run it.

## 2020-10-28 DIAGNOSIS — F3161 Bipolar disorder, current episode mixed, mild: Secondary | ICD-10-CM | POA: Diagnosis not present

## 2020-10-28 DIAGNOSIS — Z13220 Encounter for screening for lipoid disorders: Secondary | ICD-10-CM | POA: Diagnosis not present

## 2020-10-28 DIAGNOSIS — J4530 Mild persistent asthma, uncomplicated: Secondary | ICD-10-CM | POA: Diagnosis not present

## 2020-10-28 DIAGNOSIS — Z0001 Encounter for general adult medical examination with abnormal findings: Secondary | ICD-10-CM | POA: Diagnosis not present

## 2020-10-28 DIAGNOSIS — F603 Borderline personality disorder: Secondary | ICD-10-CM | POA: Diagnosis not present

## 2020-10-28 DIAGNOSIS — K219 Gastro-esophageal reflux disease without esophagitis: Secondary | ICD-10-CM | POA: Diagnosis not present

## 2020-11-01 ENCOUNTER — Ambulatory Visit (INDEPENDENT_AMBULATORY_CARE_PROVIDER_SITE_OTHER): Payer: Medicare Other | Admitting: *Deleted

## 2020-11-01 DIAGNOSIS — J309 Allergic rhinitis, unspecified: Secondary | ICD-10-CM

## 2020-11-29 ENCOUNTER — Ambulatory Visit (INDEPENDENT_AMBULATORY_CARE_PROVIDER_SITE_OTHER): Payer: Medicare Other | Admitting: *Deleted

## 2020-11-29 DIAGNOSIS — J309 Allergic rhinitis, unspecified: Secondary | ICD-10-CM

## 2020-12-05 ENCOUNTER — Other Ambulatory Visit: Payer: Self-pay | Admitting: Physician Assistant

## 2020-12-06 NOTE — Telephone Encounter (Signed)
Last filled 6/30

## 2020-12-17 ENCOUNTER — Ambulatory Visit (INDEPENDENT_AMBULATORY_CARE_PROVIDER_SITE_OTHER): Payer: Medicare Other | Admitting: Physician Assistant

## 2020-12-17 ENCOUNTER — Encounter: Payer: Self-pay | Admitting: Physician Assistant

## 2020-12-17 ENCOUNTER — Other Ambulatory Visit: Payer: Self-pay

## 2020-12-17 DIAGNOSIS — F411 Generalized anxiety disorder: Secondary | ICD-10-CM | POA: Diagnosis not present

## 2020-12-17 DIAGNOSIS — G2401 Drug induced subacute dyskinesia: Secondary | ICD-10-CM | POA: Diagnosis not present

## 2020-12-17 DIAGNOSIS — G4700 Insomnia, unspecified: Secondary | ICD-10-CM | POA: Diagnosis not present

## 2020-12-17 DIAGNOSIS — F3110 Bipolar disorder, current episode manic without psychotic features, unspecified: Secondary | ICD-10-CM

## 2020-12-17 DIAGNOSIS — R053 Chronic cough: Secondary | ICD-10-CM | POA: Diagnosis not present

## 2020-12-17 MED ORDER — BUPROPION HCL ER (XL) 300 MG PO TB24: ORAL_TABLET | ORAL | 3 refills | Status: DC

## 2020-12-17 MED ORDER — VALBENAZINE TOSYLATE 40 MG PO CAPS: 40.0000 mg | ORAL_CAPSULE | Freq: Every day | ORAL | 1 refills | Status: DC

## 2020-12-17 MED ORDER — CARIPRAZINE HCL 3 MG PO CAPS: 3.0000 mg | ORAL_CAPSULE | Freq: Every day | ORAL | 5 refills | Status: DC

## 2020-12-17 NOTE — Progress Notes (Signed)
[facility] Med Check  Patient ID: Candace Browning,  MRN: [id number]  PCP: Bartholome Bill, MD  Date of Evaluation: 12/17/2020 Time spent:40 minutes  Chief Complaint:  Chief Complaint   Follow-up      HISTORY/CURRENT STATUS: HPI for 2 month med check.  Since LOV, we added Austedo for cough from TD. It did help, took it for 9 weeks. But, she had bad brain fog to the point it affected her work and at home.  She weaned back down to just 9 mg twice daily to see if that would help but it really did not help with that symptom plus she started noticing her legs shaking at odd times.  So she ended up stopping it.  She is still having that very annoying cough that comes out of nowhere.  Also purses her lips a lot but that does not bother her, is the cough that is the issue.  For the past few nights she has not been sleeping well at all.  Even with the Xanax she will fall asleep okay but wakes up, her mind thinking about every little thing that needs to be done or that she has ever done.  For the past 2 weeks or so she has noticed that she is becoming a little more irritable and moody.  More energy.  Slightly more impulsive, no risky behavior no increased libido or grandiosity.  Cannot stay focused, does something for a little bit and then switches over to something else and not finishing anything.  No hallucinations.  Patient denies loss of interest in usual activities and is able to enjoy things.  Denies decreased energy or motivation.  Appetite has not changed.  No extreme sadness, tearfulness, or feelings of hopelessness.  Denies any changes in concentration, making decisions or remembering things.  Denies suicidal or homicidal thoughts.  Still has PMS.menses are still very irregular so it is difficult to know when to take the Zoloft.    Review of Systems  Constitutional: Negative.   HENT: Negative.    Eyes: Negative.   Respiratory:  Positive for cough.   Cardiovascular: Negative.    Gastrointestinal: Negative.   Genitourinary: Negative.   Musculoskeletal: Negative.   Skin: Negative.   Neurological: Negative.   Endo/Heme/Allergies: Negative.   Psychiatric/Behavioral:         See HPI.   Individual Medical History/ Review of Systems: Changes? :No    Past medications for mental health diagnoses include: BuSpar, Xanax, Latuda, Depakote did not work at all,  Vistaril, Lamictal caused SI, Valium, Seroquel, Abilify, Risperdal, lithium, Equetro, Geodon, prazosin, doxazosin, Luvox, Zoloft, Vraylar has TD at higher doses. Wellbutrin, Rexulti, Zyprexa caused confusion and stuttering, Ingrezza wasn't helpful. Gabapentin unsure if it helped or not. Artane no help, Trileptal at doses higher than 300 mg bid caused severe vomiting and vertigo. Austedo at high doses caused extreme brain fog but helped the cough but even at low doses caused tremor in legs.   Allergies: Apple, Coconut oil, Gluten meal, Latex, Nsaids, Other, Peanut-containing drug products, Sulfa antibiotics, Hydrogen peroxide, Penicillins, and Wheat bran  Current Medications:  Current Outpatient Medications:    albuterol (VENTOLIN HFA) 108 (90 Base) MCG/ACT inhaler, Inhale 2 puffs into the lungs every 6 (six) hours as needed., Disp: 18 g, Rfl: 1   ALPRAZolam (XANAX) 0.5 MG tablet, TAKE 1 TABLET(0.5 MG) BY MOUTH TWICE DAILY AS NEEDED FOR ANXIETY, Disp: 60 tablet, Rfl: 5   Budeson-Glycopyrrol-Formoterol (BREZTRI AEROSPHERE) 160-9-4.8 MCG/ACT AERO, Inhale two puffs with  spacer twice daily to prevent cough or wheeze.  Rinse, gargle, and spit after use., Disp: 10.7 g, Rfl: 5   busPIRone (BUSPAR) 15 MG tablet, Take 1 tablet (15 mg total) by mouth 2 (two) times daily., Disp: 180 tablet, Rfl: 3   cariprazine (VRAYLAR) 3 MG capsule, Take 1 capsule (3 mg total) by mouth daily., Disp: 30 capsule, Rfl: 5   EPINEPHrine 0.3 mg/0.3 mL IJ SOAJ injection, Use as directed for life-threatening allergic reaction., Disp: 2 each, Rfl: 3    famotidine (PEPCID) 40 MG tablet, Take 1 tablet (40 mg total) by mouth in the morning and at bedtime., Disp: 60 tablet, Rfl: 5   levocetirizine (XYZAL) 5 MG tablet, Take 5 mg by mouth every evening., Disp: , Rfl:    omeprazole (PRILOSEC) 40 MG capsule, Take one capsule twice daily as directed., Disp: 60 capsule, Rfl: 5   valACYclovir (VALTREX) 500 MG tablet, Take 1 tablet (500 mg total) by mouth 2 (two) times daily as needed (outbreaks)., Disp: 30 tablet, Rfl: 0   valbenazine (INGREZZA) 40 MG capsule, Take 1 capsule (40 mg total) by mouth daily., Disp: 30 capsule, Rfl: 1   buPROPion (WELLBUTRIN XL) 300 MG 24 hr tablet, TAKE 1 TABLET(300 MG) BY MOUTH DAILY, Disp: 90 tablet, Rfl: 3   sertraline (ZOLOFT) 25 MG tablet, Take 1 by mouth daily for 5 to 7 days before menses begins.  After menses starts, stop the Zoloft. (Patient not taking: Reported on 12/17/2020), Disp: 60 tablet, Rfl: 0 Medication Side Effects: none  Family Medical/ Social History: Changes? None  MENTAL HEALTH EXAM:  There were no vitals taken for this visit.There is no height or weight on file to calculate BMI.  General Appearance: Casual, Neat, Well Groomed, and she does cough a few times during the appointment  Eye Contact:  Good  Speech:  Clear and Coherent, Normal Rate, and Talkative  Volume:  Normal  Mood:  Euthymic  Affect:  Appropriate  Thought Process:  Goal Directed and Descriptions of Associations: Intact  Orientation:  Full (Time, Place, and Person)  Thought Content: Logical   Suicidal Thoughts:  No  Homicidal Thoughts:  No  Memory:  WNL  Judgement:  Good  Insight:  Good  Psychomotor Activity:  Normal, Restlessness, and purses her lips just a few times.  Nothing like it was when the TD was really bad.  Concentration:  Concentration: Good and Attention Span: Fair  Recall:  Good  Fund of Knowledge: Good  Language: Good  Assets:  Desire for Improvement  ADL's:  Intact  Cognition: WNL  Prognosis:  Good     DIAGNOSES:    ICD-10-CM   1. Bipolar I disorder with mania ([place])  F31.10     2. Tardive dyskinesia  G24.01     3. Generalized anxiety disorder  F41.1     4. Chronic cough  R05.3     5. Insomnia, unspecified type  G47.00         Receiving Psychotherapy: Yes    RECOMMENDATIONS:  PDMP was reviewed. Last Xanax filled 12/07/2020. I provided 40 minutes of face to face time during this encounter, including time spent before and after the visit in records review, medical decision making, counseling pertinent to today's visit, and charting.  With her symptoms it seems that she is starting a manic phase.  She agrees, she has experience with her behaviors and recognizes the symptoms.  I recommend we increase the Vraylar to a total of 4.5 mg for at  least 2 weeks.  If she is feeling better without any manic symptoms at all then she can decrease back to 3 mg total.  If she needs to stay on the 4.5 mg until our next visit then it is fine to do that as well. We discussed the cough and tardive dyskinesia symptoms.  She took Ingrezza in the past and it did not help the abnormal mouth movements.  But she was not having the cough at that time.  I recommend retrying that at the lowest dose, in hopes the cough will resolve.  She is willing to retry it. Start Ingrezza 40 mg, 2 weeks worth of samples was given as well as prescription sent in. Continue Xanax 0.5 mg, 1 p.o. twice daily as needed.   Continue Wellbutrin XL 300 mg, 1 p.o. every morning. Continue BuSpar 15 mg, 1 p.o. twice daily. Continue Vraylar 3 mg, 1 p.o. daily.  (4.5 mg for at least 2 weeks.  See explanation above.  I gave her 1 month supply of 1.5 mg and she will add 2 to 3 mg that she has.) Continue Zoloft 25 mg, 1 p.o. daily beginning 5 to 7 days premenstrually, then when menses begins, DC Zoloft. Continue therapy with Lilia Argue. Return in 4 weeks.  Donnal Moat, PA-C

## 2020-12-21 ENCOUNTER — Telehealth: Payer: Self-pay

## 2020-12-21 NOTE — Telephone Encounter (Signed)
Cicero, thanks.  I needed that reminder.

## 2020-12-21 NOTE — Telephone Encounter (Signed)
Prior Authorization submitted and approved for INGREZZA 40 MG effective 11/21/2020-12/21/2021 with [employer] Case ID# [id number]  Her Rx was sent to [facility] and they will forward on to their [place].  FYI.Marland KitchenMarland KitchenMarland KitchenExpress Scripts did not ask for a baseline scale to evaluate her TD on the PA but if she needs a renewal it will probably be required.

## 2020-12-27 ENCOUNTER — Ambulatory Visit (INDEPENDENT_AMBULATORY_CARE_PROVIDER_SITE_OTHER): Payer: Medicare Other | Admitting: Allergy and Immunology

## 2020-12-27 ENCOUNTER — Other Ambulatory Visit: Payer: Self-pay

## 2020-12-27 ENCOUNTER — Encounter: Payer: Self-pay | Admitting: Allergy and Immunology

## 2020-12-27 ENCOUNTER — Telehealth: Payer: Self-pay | Admitting: Physician Assistant

## 2020-12-27 VITALS — BP 92/62 | HR 82 | Resp 18

## 2020-12-27 DIAGNOSIS — J3089 Other allergic rhinitis: Secondary | ICD-10-CM | POA: Diagnosis not present

## 2020-12-27 DIAGNOSIS — J4540 Moderate persistent asthma, uncomplicated: Secondary | ICD-10-CM

## 2020-12-27 DIAGNOSIS — T50905A Adverse effect of unspecified drugs, medicaments and biological substances, initial encounter: Secondary | ICD-10-CM

## 2020-12-27 DIAGNOSIS — Z91018 Allergy to other foods: Secondary | ICD-10-CM

## 2020-12-27 DIAGNOSIS — K219 Gastro-esophageal reflux disease without esophagitis: Secondary | ICD-10-CM | POA: Diagnosis not present

## 2020-12-27 MED ORDER — METHYLPREDNISOLONE ACETATE 80 MG/ML IJ SUSP
80.0000 mg | Freq: Once | INTRAMUSCULAR | Status: AC
  Administered 2020-12-27: 80 mg via INTRAMUSCULAR

## 2020-12-27 MED ORDER — LEVOCETIRIZINE DIHYDROCHLORIDE 5 MG PO TABS: ORAL_TABLET | ORAL | 5 refills | Status: AC

## 2020-12-27 NOTE — Telephone Encounter (Signed)
Oh no! Of course stop the Ingrezza if she hasn't already. I'm glad she's seeing an allergist. Go to ER if has trouble breathing. Also recommend Benadryl if needed for swelling, itching, or anything else.

## 2020-12-27 NOTE — Patient Instructions (Addendum)
   1.  Continue allergen avoidance measures - peanuts / tree nuts / Ingrezza  2. Treat and prevent inflammation with the following:   A. Immunotherapy  B. Breztri - 2 inhalations 1-2 times per day with spacer   C. OTC Nasacort - 1 spray each nostril 1-2 times per day  3. Treat and prevent reflux / LPR:   A. Omeprazole 40 mg -1 tablet twice a day  B. Famotidine 40 mg -1 tablet twice a day  4.  Continue the following if needed:   A.  Xyzal 5 mg - 1 tablet 1 time per day  B.  EpiPen  C.  Albuterol HFA -2 inhalations every 4-6 hours  D.  OTC Mucinex DM - 1-2 tablets 1-2 times per day  5. For this recent reaction:   A. Avoid Ingrezza  B. Depomedrol 80 mg IM delivered in clinic  C. Increase Xyzal to twice a day   5. Return to clinic in January 2023 or earlier if problem

## 2020-12-27 NOTE — Progress Notes (Signed)
[place]   Follow-up Note  Referring Provider: Bartholome Bill, MD Primary Provider: Bartholome Bill, MD Date of Office Visit: 12/27/2020  Subjective:   Candace Browning (DOB: Jan 02, 1981) is a 40 y.o. female who returns to the [facility] on 12/27/2020 in re-evaluation of the following:  HPI: Maley returns to this clinic in reevaluation of asthma, allergic rhinitis, food allergy directed against peanut, tree nut, history of LPR, and a recent allergic reaction.  Her last visit to this clinic was 20 October 2020.  She started Ingrezza recently and on day 4 of therapy she started to develop problems with itchy swollen ears which has progressed to involve her oral facial cavity with no involvement of her tongue and no disturbance in speaking or swallowing along with diffuse red raised itchy lesions across her body that blossomed within the past 24 hours.  She discontinued this medicine this morning which would be day 10 of treatment.  She has had no associated respiratory or GI issues.  She has not had any fever or associated systemic or constitutional symptoms.  Her airway issue has significantly improved.  We are treating her for persistent cough and she is about "85% if not 90% better".  She has been able to taper down her triple inhaler to once a day and she no longer uses a nasal steroid.  She continues to treat her LPR with a combination of therapy.  She continues on immunotherapy without adverse effect every 4 weeks.  Allergies as of 12/27/2020       Reactions   Apple Anaphylaxis   Coconut Oil    Gluten Meal    All breads   Ingrezza [valbenazine Tosylate] Swelling   Latex    Nsaids Other (See Comments)   Thin basement membrane disease, advised to avoid NSAIDS   Other    PT HAD FOOD ALLERGIES:APPLES, WHEAT, AND all nuts   Peanut-containing Drug Products    Sulfa Antibiotics    Hydrogen Peroxide Itching,  Rash   Penicillins Rash   Has patient had a PCN reaction causing immediate rash, facial/tongue/throat swelling, SOB or lightheadedness with hypotension: yes  Has patient had a PCN reaction causing severe rash involving mucus membranes or skin necrosis: no Has patient had a PCN reaction that required hospitalization: no  Has patient had a PCN reaction occurring within the last 10 years: no If all of the above answers are "NO", then may proceed with Cephalosporin use.   Wheat Bran Rash        Medication List    albuterol 108 (90 Base) MCG/ACT inhaler Commonly known as: VENTOLIN HFA Inhale 2 puffs into the lungs every 6 (six) hours as needed.   ALPRAZolam 0.5 MG tablet Commonly known as: XANAX TAKE 1 TABLET(0.5 MG) BY MOUTH TWICE DAILY AS NEEDED FOR ANXIETY   Breztri Aerosphere 160-9-4.8 MCG/ACT Aero Generic drug: Budeson-Glycopyrrol-Formoterol Inhale two puffs with spacer twice daily to prevent cough or wheeze.  Rinse, gargle, and spit after use.   buPROPion 300 MG 24 hr tablet Commonly known as: WELLBUTRIN XL TAKE 1 TABLET(300 MG) BY MOUTH DAILY   busPIRone 15 MG tablet Commonly known as: BUSPAR Take 1 tablet (15 mg total) by mouth 2 (two) times daily.   cariprazine 3 MG capsule Commonly known as: Vraylar Take 1 capsule (3 mg total) by mouth daily.   EPINEPHrine 0.3 mg/0.3 mL Soaj injection Commonly known as: EPI-PEN Use as directed  for life-threatening allergic reaction.   famotidine 40 MG tablet Commonly known as: PEPCID Take 1 tablet (40 mg total) by mouth in the morning and at bedtime.   levocetirizine 5 MG tablet Commonly known as: XYZAL Take 5 mg by mouth every evening.   omeprazole 40 MG capsule Commonly known as: PRILOSEC Take one capsule twice daily as directed.   sertraline 25 MG tablet Commonly known as: ZOLOFT Take 1 by mouth daily for 5 to 7 days before menses begins.  After menses starts, stop the Zoloft.   valACYclovir 500 MG tablet Commonly  known as: VALTREX Take 1 tablet (500 mg total) by mouth 2 (two) times daily as needed (outbreaks).   valbenazine 40 MG capsule Commonly known as: INGREZZA Take 1 capsule (40 mg total) by mouth daily.    Past Medical History:  Diagnosis Date   Allergic rhinitis    Anemia    Anxiety    Asthma    Atopic dermatitis    Bipolar 1 disorder (HCC)    Bipolar 1 disorder, mixed, mild ([place]) 03/29/2017   Eczema    Food allergy    Herpes simplex without mention of complication    HSV2   Renal disorder     Past Surgical History:  Procedure Laterality Date   WISDOM TOOTH EXTRACTION      Review of systems negative except as noted in HPI / PMHx or noted below:  Review of Systems  Constitutional: Negative.   HENT: Negative.    Eyes: Negative.   Respiratory: Negative.    Cardiovascular: Negative.   Gastrointestinal: Negative.   Genitourinary: Negative.   Musculoskeletal: Negative.   Skin: Negative.   Neurological: Negative.   Endo/Heme/Allergies: Negative.   Psychiatric/Behavioral: Negative.      Objective:   Vitals:   12/27/20 1049  BP: 92/62  Pulse: 82  Resp: 18  SpO2: 99%          Physical Exam Constitutional:      Appearance: She is not diaphoretic.  HENT:     Head: Normocephalic.     Right Ear: Tympanic membrane, ear canal and external ear normal.     Left Ear: Tympanic membrane, ear canal and external ear normal.     Nose: Nose normal. No mucosal edema or rhinorrhea.     Mouth/Throat:     Pharynx: Uvula midline. No oropharyngeal exudate.     Comments: Bilateral upper and lower lip swelling.  No tongue swelling.  No palatal or uvular swelling Eyes:     Conjunctiva/sclera: Conjunctivae normal.  Neck:     Thyroid: No thyromegaly.     Trachea: Trachea normal. No tracheal tenderness or tracheal deviation.  Cardiovascular:     Rate and Rhythm: Normal rate and regular rhythm.     Heart sounds: Normal heart sounds, S1 normal and S2 normal. No murmur  heard. Pulmonary:     Effort: No respiratory distress.     Breath sounds: Normal breath sounds. No stridor. No wheezing or rales.  Lymphadenopathy:     Head:     Right side of head: No tonsillar adenopathy.     Left side of head: No tonsillar adenopathy.     Cervical: No cervical adenopathy.  Skin:    Findings: Rash (Diffuse blanching urticarial lesions extremities and trunk) present.     Nails: There is no clubbing.  Neurological:     Mental Status: She is alert.    Diagnostics: none  Assessment and Plan:   1. Adverse drug  effect, initial encounter   2. Asthma, moderate persistent, well-controlled   3. Other allergic rhinitis   4. LPRD (laryngopharyngeal reflux disease)   5. Food allergy     1.  Continue allergen avoidance measures - peanuts / tree nuts / Ingrezza  2. Treat and prevent inflammation with the following:   A. Immunotherapy  B. Breztri - 2 inhalations 1-2 times per day with spacer   C. OTC Nasacort - 1 spray each nostril 1-2 times per day  3. Treat and prevent reflux / LPR:   A. Omeprazole 40 mg -1 tablet twice a day  B. Famotidine 40 mg -1 tablet twice a day  4.  Continue the following if needed:   A.  Xyzal 5 mg - 1 tablet 1 time per day  B.  EpiPen  C.  Albuterol HFA -2 inhalations every 4-6 hours  D.  OTC Mucinex DM - 1-2 tablets 1-2 times per day  5. For this recent reaction:   A. Avoid Ingrezza  B. Depomedrol 80 mg IM delivered in clinic  C. Increase Xyzal to twice a day   6. Return to clinic in January 2023 or earlier if problem  Caly is obviously having an immunologic reaction directed against her recently administered medication and she needs to discontinue this agent and we have given her a systemic steroid to help with her immune activation and she will need to use an antihistamine on a consistent basis during this event.  She does have an EpiPen should she develop a severe allergic reaction.  Hopefully this will not transition into an  inflammatory dermatosis.  She will keep in contact with me noting her response to this approach.  Fortunately, her airway issue is under very good control on her current therapy and she has been able to taper down some of her medications as noted above.  Her reflux is well is also under very good control.  We will see her back in this clinic in January 2023 or earlier if there is a problem.  Allena Katz, MD Allergy / Immunology [place]

## 2020-12-27 NOTE — Telephone Encounter (Signed)
Next visit is 01/14/21. Candace Browning called and said she started taking Ingrezza 9 days ago and yesterday woke up with her face swollen and her lips were swollen 3 times their size. She is going to see an allergy doctor now. She will let us know what the doctor said. Her phone number is [phone].

## 2020-12-27 NOTE — Telephone Encounter (Signed)
Please review

## 2020-12-28 ENCOUNTER — Encounter: Payer: Self-pay | Admitting: Allergy and Immunology

## 2020-12-28 NOTE — Telephone Encounter (Signed)
noted 

## 2020-12-28 NOTE — Telephone Encounter (Signed)
Pt is doing better today.She stated her lips are still a little swollen.No trouble breathing.The allergist gave her a shot and told her to stay away from [place].

## 2021-01-10 ENCOUNTER — Ambulatory Visit (INDEPENDENT_AMBULATORY_CARE_PROVIDER_SITE_OTHER): Payer: Medicare Other | Admitting: *Deleted

## 2021-01-10 DIAGNOSIS — J309 Allergic rhinitis, unspecified: Secondary | ICD-10-CM | POA: Diagnosis not present

## 2021-01-14 ENCOUNTER — Encounter: Payer: Self-pay | Admitting: Physician Assistant

## 2021-01-14 ENCOUNTER — Other Ambulatory Visit: Payer: Self-pay

## 2021-01-14 ENCOUNTER — Ambulatory Visit (INDEPENDENT_AMBULATORY_CARE_PROVIDER_SITE_OTHER): Payer: Medicare Other | Admitting: Physician Assistant

## 2021-01-14 DIAGNOSIS — F319 Bipolar disorder, unspecified: Secondary | ICD-10-CM | POA: Diagnosis not present

## 2021-01-14 DIAGNOSIS — Z79899 Other long term (current) drug therapy: Secondary | ICD-10-CM | POA: Diagnosis not present

## 2021-01-14 DIAGNOSIS — R5383 Other fatigue: Secondary | ICD-10-CM

## 2021-01-14 DIAGNOSIS — G2401 Drug induced subacute dyskinesia: Secondary | ICD-10-CM | POA: Diagnosis not present

## 2021-01-14 DIAGNOSIS — E559 Vitamin D deficiency, unspecified: Secondary | ICD-10-CM | POA: Diagnosis not present

## 2021-01-14 DIAGNOSIS — F411 Generalized anxiety disorder: Secondary | ICD-10-CM

## 2021-01-14 MED ORDER — BUPROPION HCL ER (XL) 150 MG PO TB24: 150.0000 mg | ORAL_TABLET | Freq: Every day | ORAL | 1 refills | Status: DC

## 2021-01-14 NOTE — Progress Notes (Signed)
[facility]  Patient ID: Candace Browning,  MRN: [id number]  PCP: Bartholome Bill, MD  Date of Evaluation: 01/14/2021 Time spent:45 minutes  Chief Complaint:  Chief Complaint   Depression      HISTORY/CURRENT STATUS: HPI For routine follow up.   Was started back on Ingrezza after her last visit for the cough associated with TD.  She had a horrible reaction with rash all over as well as swelling of her face, lips, and ears, as well as itching all over.  She had no tongue swelling, troubles speaking, or trouble swallowing.  She saw her allergist and was given Depo-Medrol injection, Xyzal was increased to twice daily.  She stopped the Ingrezza that day, which was day 10 of the treatment.  The swelling went down and she had no further problems.  Julio Alm is on her medication list as a severe allergy and listed below and underlined.   She had decreased the Vraylar back to 3 mg because she was starting to have worsening of the leg shaking and the cough.  Once she decreased the dose those things actually stopped for a few days and have now recurred.  The cough is not nearly as bad as it was prior to the increase of Vraylar and then decrease again.  The leg shaking is only on the right and depends on how she is sitting.  She does not want to try another medication or do anything about that.  She feels like she is starting to go into a depression though.  "I can feel it in my bones that it is coming."  She is sleeping well and too much.  She is isolating except for going to work.  Energy and motivation are decreasing.  Not crying easily.  Appetite is normal and weight is stable.  Denies suicidal or homicidal thoughts.  Still has some anxiety.  Xanax is helpful when needed.  Not really having panic attacks but more of a generalized sense of unease.  Patient denies increased energy with decreased need for sleep, no increased talkativeness, no racing thoughts, no impulsivity or  risky behaviors, no increased spending, no increased libido, no grandiosity, no increased irritability or anger, and no hallucinations.  Review of Systems  Constitutional:  Positive for malaise/fatigue.  HENT: Negative.    Eyes: Negative.   Respiratory:  Positive for cough.        Occasional dry cough related to the tardive dyskinesia.  Cardiovascular: Negative.   Gastrointestinal: Negative.   Genitourinary: Negative.   Musculoskeletal:        Feels achy all over.   Skin: Negative.   Neurological: Negative.   Endo/Heme/Allergies: Negative.   Psychiatric/Behavioral:  Positive for depression.        See HPI.    Individual Medical History/ Review of Systems: Changes? :No    Past medications for mental health diagnoses include: BuSpar, Xanax, Latuda, Depakote did not work at all,  Vistaril, Lamictal caused SI, Valium, Seroquel, Abilify, Risperdal, lithium, Equetro, Geodon, prazosin, doxazosin, Luvox, Zoloft, Vraylar has TD at higher doses. Wellbutrin, Rexulti, Zyprexa caused confusion and stuttering, Gabapentin unsure if it helped or not. Artane no help, Trileptal at doses higher than 300 mg bid caused severe vomiting and vertigo. Austedo at high doses caused extreme brain fog but helped the cough but even at low doses caused tremor in legs.   INGREZZA CAUSED SWELLING OF FACE AND RASH ALL OVER, NO SOB.  Allergies: Apple, Ingrezza [valbenazine tosylate], Coconut oil, Gluten meal,  Latex, Nsaids, Other, Peanut-containing drug products, Sulfa antibiotics, Hydrogen peroxide, Penicillins, and Wheat bran  Current Medications:  Current Outpatient Medications:    albuterol (VENTOLIN HFA) 108 (90 Base) MCG/ACT inhaler, Inhale 2 puffs into the lungs every 6 (six) hours as needed., Disp: 18 g, Rfl: 1   ALPRAZolam (XANAX) 0.5 MG tablet, TAKE 1 TABLET(0.5 MG) BY MOUTH TWICE DAILY AS NEEDED FOR ANXIETY, Disp: 60 tablet, Rfl: 5   Budeson-Glycopyrrol-Formoterol (BREZTRI AEROSPHERE) 160-9-4.8 MCG/ACT  AERO, Inhale two puffs with spacer twice daily to prevent cough or wheeze.  Rinse, gargle, and spit after use., Disp: 10.7 g, Rfl: 5   buPROPion (WELLBUTRIN XL) 150 MG 24 hr tablet, Take 1 tablet (150 mg total) by mouth daily. Take 1 po with the 300 mg= 450 mg/d., Disp: 90 tablet, Rfl: 1   buPROPion (WELLBUTRIN XL) 300 MG 24 hr tablet, TAKE 1 TABLET(300 MG) BY MOUTH DAILY, Disp: 90 tablet, Rfl: 3   busPIRone (BUSPAR) 15 MG tablet, Take 1 tablet (15 mg total) by mouth 2 (two) times daily., Disp: 180 tablet, Rfl: 3   cariprazine (VRAYLAR) 3 MG capsule, Take 1 capsule (3 mg total) by mouth daily., Disp: 30 capsule, Rfl: 5   EPINEPHrine 0.3 mg/0.3 mL IJ SOAJ injection, Use as directed for life-threatening allergic reaction., Disp: 2 each, Rfl: 3   famotidine (PEPCID) 40 MG tablet, Take 1 tablet (40 mg total) by mouth in the morning and at bedtime., Disp: 60 tablet, Rfl: 5   levocetirizine (XYZAL) 5 MG tablet, Take 1 tablet once or twice daily as needed, Disp: 60 tablet, Rfl: 5   omeprazole (PRILOSEC) 40 MG capsule, Take one capsule twice daily as directed., Disp: 60 capsule, Rfl: 5   valACYclovir (VALTREX) 500 MG tablet, Take 1 tablet (500 mg total) by mouth 2 (two) times daily as needed (outbreaks)., Disp: 30 tablet, Rfl: 0 Medication Side Effects: none  Family Medical/ Social History: Changes? None  MENTAL HEALTH EXAM:  There were no vitals taken for this visit.There is no height or weight on file to calculate BMI.  General Appearance: Casual, Neat, and Well Groomed  Eye Contact:  Good  Speech:  Clear and Coherent, Normal Rate, and Talkative  Volume:  Normal  Mood:  Depressed  Affect:  Depressed  Thought Process:  Goal Directed and Descriptions of Associations: Intact  Orientation:  Full (Time, Place, and Person)  Thought Content: Logical   Suicidal Thoughts:  No  Homicidal Thoughts:  No  Memory:  WNL  Judgement:  Good  Insight:  Good  Psychomotor Activity:  Normal and no pursing of lips  and rarely shakes her right leg.  Concentration:  Concentration: Good and Attention Span: Fair  Recall:  Good  Fund of Knowledge: Good  Language: Good  Assets:  Desire for Improvement  ADL's:  Intact  Cognition: WNL  Prognosis:  Good   Labs 10/28/2020 CBC with differential White count is normal, hemoglobin slightly low at 12.1, hematocrit normal, platelets normal CMP normal Lipid panel all normal.  DIAGNOSES:    ICD-10-CM   1. Bipolar depression (HCC)  F31.9 VITAMIN D 25 Hydroxy (Vit-D Deficiency, Fractures)    B12 and Folate Panel    CBC with Differential/Platelet    Comprehensive metabolic panel    Hemoglobin A1c    TSH    2. Fatigue, unspecified type  R53.83 VITAMIN D 25 Hydroxy (Vit-D Deficiency, Fractures)    B12 and Folate Panel    CBC with Differential/Platelet    Comprehensive metabolic panel  Hemoglobin A1c    TSH    3. Generalized anxiety disorder  F41.1     4. Tardive dyskinesia  G24.01     5. Encounter for long-term (current) use of medications  Z79.899 VITAMIN D 25 Hydroxy (Vit-D Deficiency, Fractures)    B12 and Folate Panel    CBC with Differential/Platelet    Comprehensive metabolic panel    Hemoglobin A1c    TSH    6. Hypovitaminosis D  E55.9 VITAMIN D 25 Hydroxy (Vit-D Deficiency, Fractures)       Receiving Psychotherapy: Yes   with Lilia Argue.   RECOMMENDATIONS:  PDMP was reviewed.  Last Xanax filled 12/07/2020. I provided 45 minutes of face to face time during this encounter, including time spent before and after the visit in records review, medical decision making, and charting.  We had a lengthy discussion concerning her medications.  Concerning the Ingrezza that has been marked as a severe allergy, she should never take it again.  And I would be very cautious ever retrying Austedo. Even though she had labs a couple of months ago, there are several things that need to be rechecked and other things to be ordered.  We specifically talked  about low vitamin D levels.  That is a very common problem that can cause fatigue and worsen depression, especially this time of year.  That will be a part of the labs that we draw. Also discussed a mood therapy lamp. Recommend increasing Wellbutrin because that can help elevate mood as well as increased energy and motivation.   Continue Xanax 0.5 mg, 1 p.o. twice daily as needed.   Increase Wellbutrin XL to a total of 450 mg p.o. every morning.   Continue BuSpar 15 mg, 1 p.o. twice daily. Continue Vraylar 3 mg, 1 p.o. daily.  Continue therapy with Lilia Argue. Labs ordered as above. Return in 4-6 weeks.  Donnal Moat, PA-C

## 2021-01-18 ENCOUNTER — Ambulatory Visit: Payer: Medicare Other | Admitting: Physician Assistant

## 2021-01-20 DIAGNOSIS — J301 Allergic rhinitis due to pollen: Secondary | ICD-10-CM | POA: Diagnosis not present

## 2021-01-21 DIAGNOSIS — J3089 Other allergic rhinitis: Secondary | ICD-10-CM | POA: Diagnosis not present

## 2021-01-21 NOTE — Progress Notes (Signed)
VIALS MADE. EXP 01-21-22

## 2021-01-24 ENCOUNTER — Ambulatory Visit (INDEPENDENT_AMBULATORY_CARE_PROVIDER_SITE_OTHER): Payer: Medicare Other

## 2021-01-24 DIAGNOSIS — J309 Allergic rhinitis, unspecified: Secondary | ICD-10-CM

## 2021-01-24 DIAGNOSIS — Z1231 Encounter for screening mammogram for malignant neoplasm of breast: Secondary | ICD-10-CM | POA: Diagnosis not present

## 2021-01-24 DIAGNOSIS — N898 Other specified noninflammatory disorders of vagina: Secondary | ICD-10-CM | POA: Diagnosis not present

## 2021-01-24 DIAGNOSIS — N951 Menopausal and female climacteric states: Secondary | ICD-10-CM | POA: Diagnosis not present

## 2021-01-25 DIAGNOSIS — J3081 Allergic rhinitis due to animal (cat) (dog) hair and dander: Secondary | ICD-10-CM | POA: Diagnosis not present

## 2021-02-07 DIAGNOSIS — N951 Menopausal and female climacteric states: Secondary | ICD-10-CM | POA: Diagnosis not present

## 2021-02-07 DIAGNOSIS — Z780 Asymptomatic menopausal state: Secondary | ICD-10-CM | POA: Diagnosis not present

## 2021-02-18 ENCOUNTER — Ambulatory Visit: Payer: Medicare Other | Admitting: Physician Assistant

## 2021-02-21 ENCOUNTER — Ambulatory Visit (INDEPENDENT_AMBULATORY_CARE_PROVIDER_SITE_OTHER): Payer: Medicare Other | Admitting: *Deleted

## 2021-02-21 ENCOUNTER — Ambulatory Visit: Payer: Medicare Other | Admitting: Allergy and Immunology

## 2021-02-21 DIAGNOSIS — J309 Allergic rhinitis, unspecified: Secondary | ICD-10-CM

## 2021-03-22 ENCOUNTER — Ambulatory Visit (INDEPENDENT_AMBULATORY_CARE_PROVIDER_SITE_OTHER): Payer: Medicare HMO | Admitting: *Deleted

## 2021-03-22 DIAGNOSIS — J309 Allergic rhinitis, unspecified: Secondary | ICD-10-CM

## 2021-03-28 ENCOUNTER — Ambulatory Visit: Payer: Medicare Other | Admitting: Allergy and Immunology

## 2021-04-01 ENCOUNTER — Encounter: Payer: Self-pay | Admitting: Physician Assistant

## 2021-04-01 ENCOUNTER — Ambulatory Visit (INDEPENDENT_AMBULATORY_CARE_PROVIDER_SITE_OTHER): Payer: Medicare HMO | Admitting: Physician Assistant

## 2021-04-01 ENCOUNTER — Other Ambulatory Visit: Payer: Self-pay

## 2021-04-01 DIAGNOSIS — F99 Mental disorder, not otherwise specified: Secondary | ICD-10-CM

## 2021-04-01 DIAGNOSIS — R5383 Other fatigue: Secondary | ICD-10-CM | POA: Diagnosis not present

## 2021-04-01 DIAGNOSIS — F319 Bipolar disorder, unspecified: Secondary | ICD-10-CM | POA: Diagnosis not present

## 2021-04-01 DIAGNOSIS — F411 Generalized anxiety disorder: Secondary | ICD-10-CM | POA: Diagnosis not present

## 2021-04-01 DIAGNOSIS — Z79899 Other long term (current) drug therapy: Secondary | ICD-10-CM

## 2021-04-01 DIAGNOSIS — E559 Vitamin D deficiency, unspecified: Secondary | ICD-10-CM

## 2021-04-01 DIAGNOSIS — F5105 Insomnia due to other mental disorder: Secondary | ICD-10-CM

## 2021-04-01 MED ORDER — TRAZODONE HCL 100 MG PO TABS: 50.0000 mg | ORAL_TABLET | Freq: Every evening | ORAL | 0 refills | Status: DC | PRN

## 2021-04-01 NOTE — Progress Notes (Signed)
[facility]  Patient ID: Candace Browning,  MRN: [id number]  PCP: Bartholome Bill, MD  Date of Evaluation: 04/01/2021 Time spent:40 minutes  Chief Complaint:  Chief Complaint   Depression; Anxiety; Insomnia; Follow-up      HISTORY/CURRENT STATUS: HPI For routine follow up.   The increased dose of Wellbutrin didn't help her mood one bit.  But it may be causing disturbed sleep.  Sometimes she has trouble falling asleep and sometimes she wakes up during the night and has a hard time going back to sleep.  She talked to her therapist about her mood and the therapist told her that medication is not going to fix everything.  [name] understands and is working on things she can do to help herself, exercise, healthy diet, etc.  She is able to enjoy some things though and energy and motivation can be normal, depending on the circumstances.  It is just difficult for her at this time of year, not having any sunshine to speak of.  She is not crying easily.  Appetite is normal and her weight is stable.  No suicidal or homicidal thoughts.  Patient denies increased energy with decreased need for sleep, no increased talkativeness, no racing thoughts, no impulsivity or risky behaviors, no increased spending, no increased libido, no grandiosity, no increased irritability or anger, and no hallucinations.  She never had her labs drawn that were ordered at the last visit.  She accidentally threw away the order form.  Would like to have a new 1 so she can go.  Denies dizziness, syncope, seizures, numbness, tingling, tremor, tics, unsteady gait, slurred speech, confusion.  That sporadic cough she had thought to be tardive dyskinesia is no longer present.  Denies muscle or joint pain, stiffness, or dystonia.   Individual Medical History/ Review of Systems: Changes? :No    Past medications for mental health diagnoses include: BuSpar, Xanax, Latuda, Depakote did not work at all,  Vistaril,  Lamictal caused SI, Valium, Seroquel, Abilify, Risperdal, lithium, Equetro, Geodon, prazosin, doxazosin, Luvox, Zoloft, Vraylar has TD at higher doses. Wellbutrin, Rexulti, Zyprexa caused confusion and stuttering, Gabapentin unsure if it helped or not. Artane no help, Trileptal at doses higher than 300 mg bid caused severe vomiting and vertigo. Austedo at high doses caused extreme brain fog but helped the cough but even at low doses caused tremor in legs.   INGREZZA CAUSED SWELLING OF FACE AND RASH ALL OVER, NO SOB.  Allergies: Apple, Ingrezza [valbenazine tosylate], Coconut oil, Gluten meal, Latex, Nsaids, Other, Peanut-containing drug products, Sulfa antibiotics, Hydrogen peroxide, Penicillins, and Wheat bran  Current Medications:  Current Outpatient Medications:    albuterol (VENTOLIN HFA) 108 (90 Base) MCG/ACT inhaler, Inhale 2 puffs into the lungs every 6 (six) hours as needed., Disp: 18 g, Rfl: 1   ALPRAZolam (XANAX) 0.5 MG tablet, TAKE 1 TABLET(0.5 MG) BY MOUTH TWICE DAILY AS NEEDED FOR ANXIETY, Disp: 60 tablet, Rfl: 5   buPROPion (WELLBUTRIN XL) 300 MG 24 hr tablet, TAKE 1 TABLET(300 MG) BY MOUTH DAILY, Disp: 90 tablet, Rfl: 3   busPIRone (BUSPAR) 15 MG tablet, Take 1 tablet (15 mg total) by mouth 2 (two) times daily., Disp: 180 tablet, Rfl: 3   cariprazine (VRAYLAR) 3 MG capsule, Take 1 capsule (3 mg total) by mouth daily., Disp: 30 capsule, Rfl: 5   EPINEPHrine 0.3 mg/0.3 mL IJ SOAJ injection, Use as directed for life-threatening allergic reaction., Disp: 2 each, Rfl: 3   famotidine (PEPCID) 40 MG tablet, Take 1  tablet (40 mg total) by mouth in the morning and at bedtime., Disp: 60 tablet, Rfl: 5   levocetirizine (XYZAL) 5 MG tablet, Take 1 tablet once or twice daily as needed, Disp: 60 tablet, Rfl: 5   omeprazole (PRILOSEC) 40 MG capsule, Take one capsule twice daily as directed., Disp: 60 capsule, Rfl: 5   traZODone (DESYREL) 100 MG tablet, Take 0.5-1 tablets (50-100 mg total) by mouth  at bedtime as needed for sleep., Disp: 30 tablet, Rfl: 0   valACYclovir (VALTREX) 500 MG tablet, Take 1 tablet (500 mg total) by mouth 2 (two) times daily as needed (outbreaks)., Disp: 30 tablet, Rfl: 0   Budeson-Glycopyrrol-Formoterol (BREZTRI AEROSPHERE) 160-9-4.8 MCG/ACT AERO, Inhale two puffs with spacer twice daily to prevent cough or wheeze.  Rinse, gargle, and spit after use. (Patient not taking: Reported on 04/01/2021), Disp: 10.7 g, Rfl: 5 Medication Side Effects: none  Family Medical/ Social History: Changes? None  MENTAL HEALTH EXAM:  There were no vitals taken for this visit.There is no height or weight on file to calculate BMI.  General Appearance: Casual, Neat, and Well Groomed  Eye Contact:  Good  Speech:  Clear and Coherent, Normal Rate, and Talkative  Volume:  Normal  Mood:  Euthymic  Affect:  Congruent  Thought Process:  Goal Directed and Descriptions of Associations: Intact  Orientation:  Full (Time, Place, and Person)  Thought Content: Logical   Suicidal Thoughts:  No  Homicidal Thoughts:  No  Memory:  WNL  Judgement:  Good  Insight:  Good  Psychomotor Activity:  Normal  Concentration:  Concentration: Good and Attention Span: Fair  Recall:  Good  Fund of Knowledge: Good  Language: Good  Assets:  Desire for Improvement  ADL's:  Intact  Cognition: WNL  Prognosis:  Good   Labs 10/28/2020 Lipid panel all normal.  DIAGNOSES:    ICD-10-CM   1. Bipolar I disorder (HCC)  F31.9 CBC with Differential/Platelet    Comprehensive metabolic panel    Hemoglobin A1c    B12 and Folate Panel    TSH    VITAMIN D 25 Hydroxy (Vit-D Deficiency, Fractures)    2. Hypovitaminosis D  E55.9 VITAMIN D 25 Hydroxy (Vit-D Deficiency, Fractures)    3. Fatigue, unspecified type  R53.83     4. Generalized anxiety disorder  F41.1     5. Insomnia due to other mental disorder  F51.05    F99     6. Encounter for long-term (current) use of medications  Z79.899 CBC with  Differential/Platelet    Comprehensive metabolic panel    Hemoglobin A1c    B12 and Folate Panel    TSH    VITAMIN D 25 Hydroxy (Vit-D Deficiency, Fractures)        Receiving Psychotherapy: Yes   with Lilia Argue.   RECOMMENDATIONS:  PDMP was reviewed.  Last Xanax filled 12/07/2020.   I provided 40  minutes of face to face time during this encounter, including time spent before and after the visit in records review, medical decision making, counseling pertinent to today's visit, and charting.  Since she did not have any improvement in mood with the increased Wellbutrin, we will go back to 300 mg.  It could also be contributing to the sleep disturbance.  She has never tried trazodone or mirtazapine.  I will prescribe trazodone, benefits risk and side effects were discussed and she accepts.  Continue Xanax 0.5 mg, 1 p.o. twice daily as needed.   Decrease Wellbutrin XL to  300 mg, 1 p.o. every morning.   Continue BuSpar 15 mg, 1 p.o. twice daily. Continue Vraylar 3 mg, 1 p.o. daily.  Start trazodone 100 mg, 1/2-1 p.o. nightly as needed sleep. Continue therapy with Lilia Argue. Labs ordered as above. Return in 3 months.   Donnal Moat, PA-C

## 2021-04-05 LAB — HEMOGLOBIN A1C
Est. average glucose Bld gHb Est-mCnc: 105 mg/dL
Hgb A1c MFr Bld: 5.3 % (ref 4.8–5.6)

## 2021-04-05 LAB — COMPREHENSIVE METABOLIC PANEL
ALT: 13 IU/L (ref 0–32)
AST: 18 IU/L (ref 0–40)
Albumin/Globulin Ratio: 1.5 (ref 1.2–2.2)
Albumin: 4 g/dL (ref 3.8–4.8)
Alkaline Phosphatase: 78 IU/L (ref 44–121)
BUN/Creatinine Ratio: 9 (ref 9–23)
BUN: 12 mg/dL (ref 6–24)
Bilirubin Total: 0.7 mg/dL (ref 0.0–1.2)
CO2: 21 mmol/L (ref 20–29)
Calcium: 9.6 mg/dL (ref 8.7–10.2)
Chloride: 104 mmol/L (ref 96–106)
Creatinine, Ser: 1.27 mg/dL — ABNORMAL HIGH (ref 0.57–1.00)
Globulin, Total: 2.6 g/dL (ref 1.5–4.5)
Glucose: 84 mg/dL (ref 70–99)
Potassium: 4.4 mmol/L (ref 3.5–5.2)
Sodium: 139 mmol/L (ref 134–144)
Total Protein: 6.6 g/dL (ref 6.0–8.5)
eGFR: 55 mL/min/{1.73_m2} — ABNORMAL LOW (ref >59)

## 2021-04-05 LAB — CBC WITH DIFFERENTIAL/PLATELET
Basophils Absolute: 0.1 10*3/uL (ref 0.0–0.2)
Basos: 1 %
EOS (ABSOLUTE): 0.4 10*3/uL (ref 0.0–0.4)
Eos: 8 %
Hematocrit: 34.7 % (ref 34.0–46.6)
Hemoglobin: 11.5 g/dL (ref 11.1–15.9)
Immature Grans (Abs): 0 10*3/uL (ref 0.0–0.1)
Immature Granulocytes: 0 %
Lymphocytes Absolute: 2.1 10*3/uL (ref 0.7–3.1)
Lymphs: 38 %
MCH: 29.6 pg (ref 26.6–33.0)
MCHC: 33.1 g/dL (ref 31.5–35.7)
MCV: 89 fL (ref 79–97)
Monocytes Absolute: 0.4 10*3/uL (ref 0.1–0.9)
Monocytes: 8 %
Neutrophils Absolute: 2.4 10*3/uL (ref 1.4–7.0)
Neutrophils: 45 %
Platelets: 317 10*3/uL (ref 150–450)
RBC: 3.88 x10E6/uL (ref 3.77–5.28)
RDW: 12.4 % (ref 11.7–15.4)
WBC: 5.5 10*3/uL (ref 3.4–10.8)

## 2021-04-05 LAB — B12 AND FOLATE PANEL
Folate: >20 ng/mL (ref >3.0)
Vitamin B-12: 1091 pg/mL (ref 232–1245)

## 2021-04-05 LAB — TSH: TSH: 0.675 u[IU]/mL (ref 0.450–4.500)

## 2021-04-05 LAB — VITAMIN D 25 HYDROXY (VIT D DEFICIENCY, FRACTURES): Vit D, 25-Hydroxy: 38.8 ng/mL (ref 30.0–100.0)

## 2021-04-05 NOTE — Progress Notes (Signed)
LVM to RC 

## 2021-04-05 NOTE — Progress Notes (Signed)
Please let her know all of her labs are normal except creatinine, one of her kidney function test is very slightly elevated at 1.27.  This is not concerning, she should be drinking more water.  We should repeat that within the next few months.  I will order that, as well as a lipid panel, she should go fasting, sometime within the next 2 to 3 months, before her next visit with me. Her vitamin D is low normal.  Make sure she is taking at least 2000 IUs daily. She is not anemic, no evidence of diabetes or any liver problems at this time.

## 2021-04-06 ENCOUNTER — Telehealth: Payer: Self-pay | Admitting: Physician Assistant

## 2021-04-06 NOTE — Progress Notes (Signed)
Patient notified of results and recommendations.

## 2021-04-06 NOTE — Telephone Encounter (Signed)
Called patient again and results and recommendations were given.

## 2021-04-06 NOTE — Telephone Encounter (Signed)
Pt LVM for Shelton Silvas to call her back about her test results.  Next appt 4/21

## 2021-04-06 NOTE — Telephone Encounter (Signed)
Called patient back and LVM to [facility].

## 2021-04-11 ENCOUNTER — Encounter: Payer: Self-pay | Admitting: Allergy and Immunology

## 2021-04-11 ENCOUNTER — Other Ambulatory Visit: Payer: Self-pay

## 2021-04-11 ENCOUNTER — Ambulatory Visit (INDEPENDENT_AMBULATORY_CARE_PROVIDER_SITE_OTHER): Payer: Medicare HMO | Admitting: Allergy and Immunology

## 2021-04-11 VITALS — BP 100/60 | HR 84 | Resp 16 | Ht 66.0 in | Wt 141.4 lb

## 2021-04-11 DIAGNOSIS — J309 Allergic rhinitis, unspecified: Secondary | ICD-10-CM | POA: Diagnosis not present

## 2021-04-11 DIAGNOSIS — J4540 Moderate persistent asthma, uncomplicated: Secondary | ICD-10-CM

## 2021-04-11 DIAGNOSIS — Z91018 Allergy to other foods: Secondary | ICD-10-CM

## 2021-04-11 DIAGNOSIS — K219 Gastro-esophageal reflux disease without esophagitis: Secondary | ICD-10-CM | POA: Diagnosis not present

## 2021-04-11 DIAGNOSIS — J3089 Other allergic rhinitis: Secondary | ICD-10-CM

## 2021-04-11 MED ORDER — FAMOTIDINE 40 MG PO TABS: 40.0000 mg | ORAL_TABLET | Freq: Two times a day (BID) | ORAL | 1 refills | Status: DC

## 2021-04-11 NOTE — Progress Notes (Signed)
[place]   Follow-up Note  Referring Provider: Bartholome Bill, MD Primary Provider: Bartholome Bill, MD Date of Office Visit: 04/11/2021  Subjective:   Candace Browning (DOB: 1980/06/18) is a 41 y.o. female who returns to the [facility] on 04/11/2021 in re-evaluation of the following:  HPI: Candace Browning returns to this clinic in reevaluation of asthma and allergic rhinitis and food allergy directed against peanut/tree nut and LPR.  Her last visit to this clinic was 27 December 2020.  She continues to do very well with her chronic cough.  Rarely does she have any cough.  There appears to be a auditorium that she must attend for her schooling that sometimes precipitates a cough but otherwise she does well and does not use a short acting bronchodilator.  She has been able to taper off both her combination inhaler for her asthma and her nasal steroid and still continues to do very well.  She has not required a systemic steroid or an antibiotic for any type of airway issue.  She continues to use aggressive therapy directed against reflux/LPR.  She continues on immunotherapy every 4 weeks.  She does not consume peanuts or tree nuts.  Allergies as of 04/11/2021       Reactions   Apple Anaphylaxis   Ingrezza [valbenazine Tosylate] Swelling   Swelling of face, rash all over, no SOB   Coconut Oil    Gluten Meal    All breads   Latex    Nsaids Other (See Comments)   Thin basement membrane disease, advised to avoid NSAIDS   Other    PT HAD FOOD ALLERGIES:APPLES, WHEAT, AND all nuts   Peanut-containing Drug Products    Sulfa Antibiotics    Hydrogen Peroxide Itching, Rash   Penicillins Rash   Has patient had a PCN reaction causing immediate rash, facial/tongue/throat swelling, SOB or lightheadedness with hypotension: yes  Has patient had a PCN reaction causing severe rash involving mucus membranes or skin necrosis:  no Has patient had a PCN reaction that required hospitalization: no  Has patient had a PCN reaction occurring within the last 10 years: no If all of the above answers are "NO", then may proceed with Cephalosporin use.   Wheat Bran Rash        Medication List    albuterol 108 (90 Base) MCG/ACT inhaler Commonly known as: VENTOLIN HFA Inhale 2 puffs into the lungs every 6 (six) hours as needed.   ALPRAZolam 0.5 MG tablet Commonly known as: XANAX TAKE 1 TABLET(0.5 MG) BY MOUTH TWICE DAILY AS NEEDED FOR ANXIETY   Breztri Aerosphere 160-9-4.8 MCG/ACT Aero Generic drug: Budeson-Glycopyrrol-Formoterol Inhale two puffs with spacer twice daily to prevent cough or wheeze.  Rinse, gargle, and spit after use.   buPROPion 300 MG 24 hr tablet Commonly known as: WELLBUTRIN XL TAKE 1 TABLET(300 MG) BY MOUTH DAILY   busPIRone 15 MG tablet Commonly known as: BUSPAR Take 1 tablet (15 mg total) by mouth 2 (two) times daily.   cariprazine 3 MG capsule Commonly known as: Vraylar Take 1 capsule (3 mg total) by mouth daily.   EPINEPHrine 0.3 mg/0.3 mL Soaj injection Commonly known as: EPI-PEN Use as directed for life-threatening allergic reaction.   famotidine 40 MG tablet Commonly known as: PEPCID Take 1 tablet (40 mg total) by mouth in the morning and at bedtime.   levocetirizine 5 MG tablet Commonly known as: XYZAL Take 1 tablet  once or twice daily as needed   omeprazole 40 MG capsule Commonly known as: PRILOSEC Take one capsule twice daily as directed.   valACYclovir 500 MG tablet Commonly known as: VALTREX Take 1 tablet (500 mg total) by mouth 2 (two) times daily as needed (outbreaks).    Past Medical History:  Diagnosis Date   Allergic rhinitis    Anemia    Anxiety    Asthma    Atopic dermatitis    Bipolar 1 disorder (HCC)    Bipolar 1 disorder, mixed, mild ([place]) 03/29/2017   Eczema    Food allergy    Herpes simplex without mention of complication    HSV2   Renal  disorder     Past Surgical History:  Procedure Laterality Date   WISDOM TOOTH EXTRACTION      Review of systems negative except as noted in HPI / PMHx or noted below:  Review of Systems  Constitutional: Negative.   HENT: Negative.    Eyes: Negative.   Respiratory: Negative.    Cardiovascular: Negative.   Gastrointestinal: Negative.   Genitourinary: Negative.   Musculoskeletal: Negative.   Skin: Negative.   Neurological: Negative.   Endo/Heme/Allergies: Negative.   Psychiatric/Behavioral: Negative.      Objective:   Vitals:   04/11/21 1535  BP: 100/60  Pulse: 84  Resp: 16  SpO2: 99%   Height: 5\' 6"  (167.6 cm)  Weight: 141 lb 6.4 oz (64.1 kg)   Physical Exam Constitutional:      Appearance: She is not diaphoretic.  HENT:     Head: Normocephalic.     Right Ear: Tympanic membrane, ear canal and external ear normal.     Left Ear: Tympanic membrane, ear canal and external ear normal.     Nose: Nose normal. No mucosal edema or rhinorrhea.     Mouth/Throat:     Pharynx: Uvula midline. No oropharyngeal exudate.  Eyes:     Conjunctiva/sclera: Conjunctivae normal.  Neck:     Thyroid: No thyromegaly.     Trachea: Trachea normal. No tracheal tenderness or tracheal deviation.  Cardiovascular:     Rate and Rhythm: Normal rate and regular rhythm.     Heart sounds: Normal heart sounds, S1 normal and S2 normal. No murmur heard. Pulmonary:     Effort: No respiratory distress.     Breath sounds: Normal breath sounds. No stridor. No wheezing or rales.  Lymphadenopathy:     Head:     Right side of head: No tonsillar adenopathy.     Left side of head: No tonsillar adenopathy.     Cervical: No cervical adenopathy.  Skin:    Findings: No erythema or rash.     Nails: There is no clubbing.  Neurological:     Mental Status: She is alert.    Diagnostics:    Spirometry was performed and demonstrated an FEV1 of 2.60 at 94 % of predicted.  Assessment and Plan:   1.  Allergic rhinitis, unspecified seasonality, unspecified trigger   2. Asthma, moderate persistent, well-controlled   3. Other allergic rhinitis   4. LPRD (laryngopharyngeal reflux disease)   5. Food allergy     1.  Continue allergen avoidance measures - peanuts / tree nuts   2.  Continue to treat and prevent inflammation with the following:   A. Immunotherapy  3.  Continue to treat and prevent reflux / LPR:   A. Omeprazole 40 mg -1 tablet twice a day  B. Famotidine 40 mg -1  tablet twice a day  4.  Continue the following if needed:   A.  Xyzal 5 mg - 1 tablet 1 time per day  B.  EpiPen  C.  Albuterol HFA -2 inhalations every 4-6 hours  D.  OTC Mucinex DM - 1-2 tablets 1-2 times per day  5.  Can restart the following for "flareup":   A. Breztri - 2 inhalations 1-2 times per day with spacer   B. OTC Nasacort - 1 spray each nostril 1-2 times per day  6. Return to clinic in 6 months or earlier if problem  Candace Browning is doing very well at this point in time regarding her atopic respiratory disease and her reflux induced respiratory disease on her current therapy which includes immunotherapy and a combination of a proton pump inhibitor and H2 receptor blocker.  She has the option of restarting her combination inhalers and nasal steroid should she develop problems as she moves forward.  If she does well I will see her back in this clinic in 6 months or earlier if there is a problem.  Allena Katz, MD Allergy / Immunology [place]

## 2021-04-11 NOTE — Patient Instructions (Signed)
° °  1.  Continue allergen avoidance measures - peanuts / tree nuts   2.  Continue to treat and prevent inflammation with the following:   A. Immunotherapy  3.  Continue to treat and prevent reflux / LPR:   A. Omeprazole 40 mg -1 tablet twice a day  B. Famotidine 40 mg -1 tablet twice a day  4.  Continue the following if needed:   A.  Xyzal 5 mg - 1 tablet 1 time per day  B.  EpiPen  C.  Albuterol HFA -2 inhalations every 4-6 hours  D.  OTC Mucinex DM - 1-2 tablets 1-2 times per day  5.  Can restart the following for "flareup":   A. Breztri - 2 inhalations 1-2 times per day with spacer   B. OTC Nasacort - 1 spray each nostril 1-2 times per day  6. Return to clinic in 6 months or earlier if problem

## 2021-04-12 ENCOUNTER — Encounter: Payer: Self-pay | Admitting: Allergy and Immunology

## 2021-05-25 ENCOUNTER — Ambulatory Visit (INDEPENDENT_AMBULATORY_CARE_PROVIDER_SITE_OTHER): Payer: Medicare HMO | Admitting: *Deleted

## 2021-05-25 DIAGNOSIS — J309 Allergic rhinitis, unspecified: Secondary | ICD-10-CM | POA: Diagnosis not present

## 2021-06-06 ENCOUNTER — Ambulatory Visit (INDEPENDENT_AMBULATORY_CARE_PROVIDER_SITE_OTHER): Payer: Medicare HMO | Admitting: *Deleted

## 2021-06-06 DIAGNOSIS — J309 Allergic rhinitis, unspecified: Secondary | ICD-10-CM

## 2021-07-04 ENCOUNTER — Ambulatory Visit (INDEPENDENT_AMBULATORY_CARE_PROVIDER_SITE_OTHER): Payer: Medicare HMO | Admitting: *Deleted

## 2021-07-04 ENCOUNTER — Other Ambulatory Visit: Payer: Self-pay | Admitting: Physician Assistant

## 2021-07-04 DIAGNOSIS — J309 Allergic rhinitis, unspecified: Secondary | ICD-10-CM | POA: Diagnosis not present

## 2021-07-08 ENCOUNTER — Telehealth (INDEPENDENT_AMBULATORY_CARE_PROVIDER_SITE_OTHER): Payer: Medicare HMO | Admitting: Physician Assistant

## 2021-07-08 ENCOUNTER — Encounter: Payer: Self-pay | Admitting: Physician Assistant

## 2021-07-08 DIAGNOSIS — F319 Bipolar disorder, unspecified: Secondary | ICD-10-CM

## 2021-07-08 DIAGNOSIS — F5105 Insomnia due to other mental disorder: Secondary | ICD-10-CM

## 2021-07-08 DIAGNOSIS — F99 Mental disorder, not otherwise specified: Secondary | ICD-10-CM

## 2021-07-08 DIAGNOSIS — F411 Generalized anxiety disorder: Secondary | ICD-10-CM | POA: Diagnosis not present

## 2021-07-08 MED ORDER — CARIPRAZINE HCL 3 MG PO CAPS: ORAL_CAPSULE | ORAL | 5 refills | Status: DC

## 2021-07-08 MED ORDER — ALPRAZOLAM 0.5 MG PO TABS: ORAL_TABLET | ORAL | 5 refills | Status: DC

## 2021-07-08 NOTE — Progress Notes (Signed)
[facility] ? ?Patient ID: Candace Browning,  ?MRN: [id number] ? ?PCP: Bartholome Bill, MD ? ?Date of Evaluation: 07/08/2021 ?Time spent:30 minutes ? ?Chief Complaint:  ?Chief Complaint   ?Follow-up ?  ? ?Virtual Visit via Telehealth ? ?I connected with patient by a video enabled telemedicine application with their informed consent, and verified patient privacy and that I am speaking with the correct person using two identifiers.  I am private, in my office and the patient is at in her car. ? ?I discussed the limitations, risks, security and privacy concerns of performing an evaluation and management service by telephone and the availability of in person appointments. I also discussed with the patient that there may be a patient responsible charge related to this service. The patient expressed understanding and agreed to proceed. ?  ?I discussed the assessment and treatment plan with the patient. The patient was provided an opportunity to ask questions and all were answered. The patient agreed with the plan and demonstrated an understanding of the instructions. ?  ?The patient was advised to call back or seek an in-person evaluation if the symptoms worsen or if the condition fails to improve as anticipated. ? ?I provided 30 minutes of non-face-to-face time during this encounter. ? ? ?HISTORY/CURRENT STATUS: ?HPI For routine follow up.  ? ?Having a lot of stress, is in college and this semester has been difficult with many projects.  She has needed the Xanax more often than she normally has.  It is working though.  For the past few weeks she has needed it every day.  Not really having panic attacks but feels overwhelmed when she has a lot to do.  Work is going well, still works at [employer].  Sleeps well most of the time. ? ?Patient denies loss of interest in usual activities and is able to enjoy things.  Denies decreased energy or motivation.  Appetite has not changed.  No extreme sadness,  tearfulness, or feelings of hopelessness.  Denies any changes in concentration, making decisions or remembering things.  ADLs and personal hygiene are normal.  Not crying easily.  Denies suicidal or homicidal thoughts. ? ?Patient denies increased energy with decreased need for sleep, no increased talkativeness, no racing thoughts, no impulsivity or risky behaviors, no increased spending, no increased libido, no grandiosity, no increased irritability or anger, and no hallucinations. ? ?Denies dizziness, syncope, seizures, numbness, tingling, tremor, tics, unsteady gait, slurred speech, confusion.  That sporadic cough she had thought to be tardive dyskinesia is no longer present.  Denies muscle or joint pain, stiffness, or dystonia.Denies unexplained weight loss, frequent infections, or sores that heal slowly.  No polyphagia, polydipsia, or polyuria. Denies visual changes or paresthesias.  ? ?Individual Medical History/ Review of Systems: Changes? :No   ? ?Past medications for mental health diagnoses include: ?BuSpar, Xanax, Latuda, Depakote did not work at all,  Vistaril, Lamictal caused SI, Valium, Seroquel, Abilify, Risperdal, lithium, Equetro, Geodon, prazosin, doxazosin, Luvox, Zoloft, Vraylar has TD at higher doses. Wellbutrin, Rexulti, Zyprexa caused confusion and stuttering, Gabapentin unsure if it helped or not. Artane no help, Trileptal at doses higher than 300 mg bid caused severe vomiting and vertigo. Austedo at high doses caused extreme brain fog but helped the cough but even at low doses caused tremor in legs.  ? ?INGREZZA CAUSED SWELLING OF FACE AND RASH ALL OVER, NO SOB. ? ?Allergies: Apple juice, Ingrezza [valbenazine tosylate], Coconut oil, Gluten meal, Latex, Nsaids, Other, Peanut-containing drug products, Sulfa antibiotics,  Hydrogen peroxide, Penicillins, and Wheat bran ? ?Current Medications:  ?Current Outpatient Medications:  ?  albuterol (VENTOLIN HFA) 108 (90 Base) MCG/ACT inhaler, Inhale 2  puffs into the lungs every 6 (six) hours as needed., Disp: 18 g, Rfl: 1 ?  buPROPion (WELLBUTRIN XL) 300 MG 24 hr tablet, TAKE 1 TABLET(300 MG) BY MOUTH DAILY, Disp: 90 tablet, Rfl: 3 ?  busPIRone (BUSPAR) 15 MG tablet, Take 1 tablet (15 mg total) by mouth 2 (two) times daily., Disp: 180 tablet, Rfl: 3 ?  EPINEPHrine 0.3 mg/0.3 mL IJ SOAJ injection, Use as directed for life-threatening allergic reaction., Disp: 2 each, Rfl: 3 ?  famotidine (PEPCID) 40 MG tablet, Take 1 tablet (40 mg total) by mouth in the morning and at bedtime., Disp: 180 tablet, Rfl: 1 ?  levocetirizine (XYZAL) 5 MG tablet, Take 1 tablet once or twice daily as needed, Disp: 60 tablet, Rfl: 5 ?  omeprazole (PRILOSEC) 40 MG capsule, Take one capsule twice daily as directed., Disp: 60 capsule, Rfl: 5 ?  valACYclovir (VALTREX) 500 MG tablet, Take 1 tablet (500 mg total) by mouth 2 (two) times daily as needed (outbreaks)., Disp: 30 tablet, Rfl: 0 ?  ALPRAZolam (XANAX) 0.5 MG tablet, TAKE 1 TABLET(0.5 MG) BY MOUTH TWICE DAILY AS NEEDED FOR ANXIETY, Disp: 60 tablet, Rfl: 5 ?  Budeson-Glycopyrrol-Formoterol (BREZTRI AEROSPHERE) 160-9-4.8 MCG/ACT AERO, Inhale two puffs with spacer twice daily to prevent cough or wheeze.  Rinse, gargle, and spit after use. (Patient not taking: Reported on 04/01/2021), Disp: 10.7 g, Rfl: 5 ?  cariprazine (VRAYLAR) 3 MG capsule, TAKE 1 CAPSULE(3 MG) BY MOUTH DAILY, Disp: 30 capsule, Rfl: 5 ?Medication Side Effects: none ? ?Family Medical/ Social History: Changes? None ? ?MENTAL HEALTH EXAM: ? ?There were no vitals taken for this visit.There is no height or weight on file to calculate BMI.  ?General Appearance: Casual, Neat, and Well Groomed  ?Eye Contact:  Good  ?Speech:  Clear and Coherent, Normal Rate, and Talkative  ?Volume:  Normal  ?Mood:  Euthymic  ?Affect:  Congruent  ?Thought Process:  Goal Directed and Descriptions of Associations: Circumstantial  ?Orientation:  Full (Time, Place, and Person)  ?Thought Content:  Logical   ?Suicidal Thoughts:  No  ?Homicidal Thoughts:  No  ?Memory:  WNL  ?Judgement:  Good  ?Insight:  Good  ?Psychomotor Activity:  Normal  ?Concentration:  Concentration: Good and Attention Span: Fair  ?Recall:  Good  ?Fund of Knowledge: Good  ?Language: Good  ?Assets:  Desire for Improvement  ?ADL's:  Intact  ?Cognition: WNL  ?Prognosis:  Good  ? ?Labs 04/04/2021 ?Glucose 84 ?Hemoglobin A1c 5.3 ?For all results see under labs. ? ?Labs 10/28/2020 ?Lipid panel all normal. ? ? ?DIAGNOSES:  ?  ICD-10-CM   ?1. Bipolar I disorder ([place])  F31.9   ?  ?2. Generalized anxiety disorder  F41.1   ?  ?3. Insomnia due to other mental disorder  F51.05   ? F99   ?  ? ? ?Receiving Psychotherapy: Yes   with Lilia Argue. ? ? ?RECOMMENDATIONS:  ?PDMP was reviewed.  Last Xanax filled 06/02/2021. ?I provided 30 minutes of non-face-to-face time during this encounter, including time spent before and after the visit in records review, medical decision making, counseling pertinent to today's visit, and charting.  ?She is doing well so no changes will be made.  If the anxiety does not improve after the semester is over in a couple of weeks then she should call and we will increase  BuSpar. ? ?Continue Xanax 0.5 mg, 1 p.o. twice daily as needed.   ?Continue Wellbutrin XL 300 mg, 1 p.o. every morning.   ?Continue BuSpar 15 mg, 1 p.o. twice daily. ?Continue Vraylar 3 mg, 1 p.o. daily.  ?Continue trazodone 100 mg, 1/2-1 p.o. nightly as needed sleep. ?Continue therapy with Lilia Argue. ?Return in 3 months.  ? ?Donnal Moat, PA-C  ?

## 2021-07-20 ENCOUNTER — Ambulatory Visit (INDEPENDENT_AMBULATORY_CARE_PROVIDER_SITE_OTHER): Payer: Medicare HMO | Admitting: *Deleted

## 2021-07-20 DIAGNOSIS — J309 Allergic rhinitis, unspecified: Secondary | ICD-10-CM | POA: Diagnosis not present

## 2021-08-03 ENCOUNTER — Ambulatory Visit (INDEPENDENT_AMBULATORY_CARE_PROVIDER_SITE_OTHER): Payer: Medicare HMO | Admitting: *Deleted

## 2021-08-03 DIAGNOSIS — J309 Allergic rhinitis, unspecified: Secondary | ICD-10-CM | POA: Diagnosis not present

## 2021-08-17 ENCOUNTER — Ambulatory Visit (INDEPENDENT_AMBULATORY_CARE_PROVIDER_SITE_OTHER): Payer: Medicare HMO | Admitting: *Deleted

## 2021-08-17 DIAGNOSIS — J309 Allergic rhinitis, unspecified: Secondary | ICD-10-CM | POA: Diagnosis not present

## 2021-09-02 ENCOUNTER — Other Ambulatory Visit: Payer: Self-pay | Admitting: Physician Assistant

## 2021-09-15 ENCOUNTER — Ambulatory Visit (INDEPENDENT_AMBULATORY_CARE_PROVIDER_SITE_OTHER): Payer: Medicare HMO | Admitting: *Deleted

## 2021-09-15 DIAGNOSIS — J309 Allergic rhinitis, unspecified: Secondary | ICD-10-CM

## 2021-09-23 ENCOUNTER — Ambulatory Visit: Payer: Medicare HMO | Admitting: Physician Assistant

## 2021-10-10 ENCOUNTER — Ambulatory Visit (INDEPENDENT_AMBULATORY_CARE_PROVIDER_SITE_OTHER): Payer: Medicare HMO | Admitting: *Deleted

## 2021-10-10 DIAGNOSIS — J309 Allergic rhinitis, unspecified: Secondary | ICD-10-CM

## 2021-10-12 DIAGNOSIS — J302 Other seasonal allergic rhinitis: Secondary | ICD-10-CM | POA: Diagnosis not present

## 2021-10-12 NOTE — Progress Notes (Signed)
VIALS EXP 10-13-22 

## 2021-10-13 DIAGNOSIS — J3089 Other allergic rhinitis: Secondary | ICD-10-CM | POA: Diagnosis not present

## 2021-10-14 DIAGNOSIS — J3081 Allergic rhinitis due to animal (cat) (dog) hair and dander: Secondary | ICD-10-CM | POA: Diagnosis not present

## 2021-10-21 ENCOUNTER — Ambulatory Visit (INDEPENDENT_AMBULATORY_CARE_PROVIDER_SITE_OTHER): Payer: Medicare HMO | Admitting: Physician Assistant

## 2021-10-21 ENCOUNTER — Encounter: Payer: Self-pay | Admitting: Physician Assistant

## 2021-10-21 DIAGNOSIS — E559 Vitamin D deficiency, unspecified: Secondary | ICD-10-CM

## 2021-10-21 DIAGNOSIS — R5383 Other fatigue: Secondary | ICD-10-CM

## 2021-10-21 DIAGNOSIS — G4700 Insomnia, unspecified: Secondary | ICD-10-CM

## 2021-10-21 DIAGNOSIS — F411 Generalized anxiety disorder: Secondary | ICD-10-CM | POA: Diagnosis not present

## 2021-10-21 DIAGNOSIS — Z79899 Other long term (current) drug therapy: Secondary | ICD-10-CM

## 2021-10-21 DIAGNOSIS — F319 Bipolar disorder, unspecified: Secondary | ICD-10-CM

## 2021-10-21 MED ORDER — CARIPRAZINE HCL 3 MG PO CAPS: ORAL_CAPSULE | ORAL | 5 refills | Status: DC

## 2021-10-21 NOTE — Progress Notes (Signed)
[facility]  Patient ID: Candace Browning,  MRN: [id number]  PCP: Bartholome Bill, MD  Date of Evaluation: 10/21/2021 Time spent:30 minutes  Chief Complaint:  Chief Complaint   Follow-up; Anxiety; Depression    HISTORY/CURRENT STATUS: HPI For routine follow up.   Doing well as far as her mental health goes. Patient is able to enjoy things.  Energy and motivation are for the most part but she has been more tired recently.  No known reason.  Work is going well. Is doing 2 days per wk. In school FT too.  No extreme sadness, tearfulness, or feelings of hopelessness.  Sleeps well most of the time. ADLs and personal hygiene are normal.   Denies any changes in concentration, making decisions, or remembering things.  Appetite has not changed.  Weight is stable.  She does need the Xanax occasionally for generalized anxiety.  Does not usually have panic attacks although it can happen sometimes.  Denies suicidal or homicidal thoughts.  Patient denies increased energy with decreased need for sleep, increased talkativeness, racing thoughts, impulsivity or risky behaviors, increased spending, increased libido, grandiosity, increased irritability or anger, paranoia, and no hallucinations.  Denies dizziness, syncope, seizures, numbness, tingling, tremor, tics, unsteady gait, slurred speech, confusion. Denies muscle or joint pain, stiffness, or dystonia. Denies unexplained weight loss, frequent infections, or sores that heal slowly.  No polyphagia, polydipsia, or polyuria. Denies visual changes or paresthesias.   Individual Medical History/ Review of Systems: Changes? :No    Past medications for mental health diagnoses include: BuSpar, Xanax, Latuda, Depakote did not work at all,  Vistaril, Lamictal caused SI, Valium, Seroquel, Abilify, Risperdal, lithium, Equetro, Geodon, prazosin, doxazosin, Luvox, Zoloft, Vraylar has TD at higher doses. Wellbutrin, Rexulti, Zyprexa caused confusion and  stuttering, Gabapentin unsure if it helped or not. Artane no help, Trileptal at doses higher than 300 mg bid caused severe vomiting and vertigo. Austedo at high doses caused extreme brain fog but helped the cough but even at low doses caused tremor in legs.   INGREZZA CAUSED SWELLING OF FACE AND RASH ALL OVER, NO SOB.  Allergies: Apple juice, Ingrezza [valbenazine tosylate], Coconut (cocos nucifera), Gluten meal, Latex, Nsaids, Other, Peanut-containing drug products, Sulfa antibiotics, Hydrogen peroxide, Penicillins, and Wheat bran  Current Medications:  Current Outpatient Medications:    ALPRAZolam (XANAX) 0.5 MG tablet, TAKE 1 TABLET(0.5 MG) BY MOUTH TWICE DAILY AS NEEDED FOR ANXIETY, Disp: 60 tablet, Rfl: 5   buPROPion (WELLBUTRIN XL) 300 MG 24 hr tablet, TAKE 1 TABLET(300 MG) BY MOUTH DAILY, Disp: 90 tablet, Rfl: 3   busPIRone (BUSPAR) 15 MG tablet, TAKE 1 TABLET(15 MG) BY MOUTH TWICE DAILY, Disp: 180 tablet, Rfl: 0   EPINEPHrine 0.3 mg/0.3 mL IJ SOAJ injection, Use as directed for life-threatening allergic reaction., Disp: 2 each, Rfl: 3   famotidine (PEPCID) 40 MG tablet, Take 1 tablet (40 mg total) by mouth in the morning and at bedtime., Disp: 180 tablet, Rfl: 1   levocetirizine (XYZAL) 5 MG tablet, Take 1 tablet once or twice daily as needed, Disp: 60 tablet, Rfl: 5   omeprazole (PRILOSEC) 40 MG capsule, Take one capsule twice daily as directed., Disp: 60 capsule, Rfl: 5   valACYclovir (VALTREX) 500 MG tablet, Take 1 tablet (500 mg total) by mouth 2 (two) times daily as needed (outbreaks)., Disp: 30 tablet, Rfl: 0   albuterol (VENTOLIN HFA) 108 (90 Base) MCG/ACT inhaler, Inhale 2 puffs into the lungs every 6 (six) hours as needed. (Patient not taking:  Reported on 10/21/2021), Disp: 18 g, Rfl: 1   Budeson-Glycopyrrol-Formoterol (BREZTRI AEROSPHERE) 160-9-4.8 MCG/ACT AERO, Inhale two puffs with spacer twice daily to prevent cough or wheeze.  Rinse, gargle, and spit after use. (Patient not  taking: Reported on 04/01/2021), Disp: 10.7 g, Rfl: 5   cariprazine (VRAYLAR) 3 MG capsule, TAKE 1 CAPSULE(3 MG) BY MOUTH DAILY, Disp: 30 capsule, Rfl: 5 Medication Side Effects: none  Family Medical/ Social History: Changes? Brother had surgery for pituitary tumor, weren't able to get it all, having to take hormones.   MENTAL HEALTH EXAM:  There were no vitals taken for this visit.There is no height or weight on file to calculate BMI.  General Appearance: Casual, Neat, and Well Groomed  Eye Contact:  Good  Speech:  Clear and Coherent, Normal Rate, and Talkative  Volume:  Normal  Mood:  Euthymic  Affect:  Congruent  Thought Process:  Goal Directed and Descriptions of Associations: Circumstantial  Orientation:  Full (Time, Place, and Person)  Thought Content: Logical   Suicidal Thoughts:  No  Homicidal Thoughts:  No  Memory:  WNL  Judgement:  Good  Insight:  Good  Psychomotor Activity:  Normal  Concentration:  Concentration: Good and Attention Span: Good  Recall:  Good  Fund of Knowledge: Good  Language: Good  Assets:  Desire for Improvement  ADL's:  Intact  Cognition: WNL  Prognosis:  Good   Labs 04/04/2021 Glucose 84 Hemoglobin A1c 5.3 For all results see under labs.  Labs 10/28/2020 Lipid panel all normal.   DIAGNOSES:    ICD-10-CM   1. Bipolar I disorder (HCC)  F31.9 Lipid panel    CBC with Differential/Platelet    Comprehensive metabolic panel    Hemoglobin A1c    VITAMIN D 25 Hydroxy (Vit-D Deficiency, Fractures)    2. Generalized anxiety disorder  F41.1     3. Insomnia, unspecified type  G47.00     4. Hypovitaminosis D  E55.9     5. Encounter for long-term (current) use of medications  Z79.899 Lipid panel    CBC with Differential/Platelet    Comprehensive metabolic panel    Hemoglobin A1c    VITAMIN D 25 Hydroxy (Vit-D Deficiency, Fractures)    6. Other fatigue  R53.83 CBC with Differential/Platelet    Comprehensive metabolic panel        Receiving Psychotherapy: Yes   with Lilia Argue.  RECOMMENDATIONS:  PDMP was reviewed.  Last Xanax filled 09/05/2021. I provided 30 minutes of face to face time during this encounter, including time spent before and after the visit in records review, medical decision making, counseling pertinent to today's visit, and charting.   She is doing well with current medications so no changes will be needed.  She has not had labs in 6 months but no lipid panel in 1 year.  Ordering those to be done fasting sometime next week.  Continue Xanax 0.5 mg, 1 p.o. twice daily as needed.   Continue Wellbutrin XL 300 mg, 1 p.o. every morning.   Continue BuSpar 15 mg, 1 p.o. twice daily. Continue Vraylar 3 mg, 1 p.o. daily.  Continue therapy with Lilia Argue. Return in 3 months.   Donnal Moat, PA-C

## 2021-10-26 LAB — CBC WITH DIFFERENTIAL/PLATELET
Basophils Absolute: 0 x10E3/uL (ref 0.0–0.2)
Basos: 1 %
EOS (ABSOLUTE): 0.4 x10E3/uL (ref 0.0–0.4)
Eos: 9 %
Hematocrit: 36.1 % (ref 34.0–46.6)
Hemoglobin: 12.6 g/dL (ref 11.1–15.9)
Immature Grans (Abs): 0 x10E3/uL (ref 0.0–0.1)
Immature Granulocytes: 0 %
Lymphocytes Absolute: 1.9 x10E3/uL (ref 0.7–3.1)
Lymphs: 38 %
MCH: 31.3 pg (ref 26.6–33.0)
MCHC: 34.9 g/dL (ref 31.5–35.7)
MCV: 90 fL (ref 79–97)
Monocytes Absolute: 0.3 x10E3/uL (ref 0.1–0.9)
Monocytes: 7 %
Neutrophils Absolute: 2.2 x10E3/uL (ref 1.4–7.0)
Neutrophils: 45 %
Platelets: 290 x10E3/uL (ref 150–450)
RBC: 4.03 x10E6/uL (ref 3.77–5.28)
RDW: 12.2 % (ref 11.7–15.4)
WBC: 4.9 x10E3/uL (ref 3.4–10.8)

## 2021-10-26 LAB — COMPREHENSIVE METABOLIC PANEL
ALT: 11 IU/L (ref 0–32)
AST: 16 IU/L (ref 0–40)
Albumin/Globulin Ratio: 1.7 (ref 1.2–2.2)
Albumin: 4.2 g/dL (ref 3.9–4.9)
Alkaline Phosphatase: 73 IU/L (ref 44–121)
BUN/Creatinine Ratio: 8 — ABNORMAL LOW (ref 9–23)
BUN: 9 mg/dL (ref 6–24)
Bilirubin Total: 1 mg/dL (ref 0.0–1.2)
CO2: 23 mmol/L (ref 20–29)
Calcium: 9.6 mg/dL (ref 8.7–10.2)
Chloride: 103 mmol/L (ref 96–106)
Creatinine, Ser: 1.12 mg/dL — ABNORMAL HIGH (ref 0.57–1.00)
Globulin, Total: 2.5 g/dL (ref 1.5–4.5)
Glucose: 94 mg/dL (ref 70–99)
Potassium: 4.4 mmol/L (ref 3.5–5.2)
Sodium: 137 mmol/L (ref 134–144)
Total Protein: 6.7 g/dL (ref 6.0–8.5)
eGFR: 63 mL/min/{1.73_m2} (ref >59)

## 2021-10-26 LAB — LIPID PANEL
Chol/HDL Ratio: 2.5 ratio (ref 0.0–4.4)
Cholesterol, Total: 155 mg/dL (ref 100–199)
HDL: 61 mg/dL (ref >39)
LDL Chol Calc (NIH): 83 mg/dL (ref 0–99)
Triglycerides: 51 mg/dL (ref 0–149)
VLDL Cholesterol Cal: 11 mg/dL (ref 5–40)

## 2021-10-26 LAB — HEMOGLOBIN A1C
Est. average glucose Bld gHb Est-mCnc: 105 mg/dL
Hgb A1c MFr Bld: 5.3 % (ref 4.8–5.6)

## 2021-10-26 LAB — VITAMIN D 25 HYDROXY (VIT D DEFICIENCY, FRACTURES): Vit D, 25-Hydroxy: 62.5 ng/mL (ref 30.0–100.0)

## 2021-10-26 NOTE — Progress Notes (Signed)
Please let her know the labs look good.  CBC with differential is normal.  Her blood sugars normal at 94.  Hemoglobin A1c 5.3 which is good.  Kidney and liver functions are normal.  Lipid profile with all normal values.  Vitamin D level is really good at 62.5. No change in treatment.

## 2021-10-26 NOTE — Progress Notes (Signed)
LVM with info

## 2021-10-27 NOTE — Progress Notes (Signed)
LVM to RC 

## 2021-11-10 ENCOUNTER — Ambulatory Visit (INDEPENDENT_AMBULATORY_CARE_PROVIDER_SITE_OTHER): Payer: Medicare HMO | Admitting: *Deleted

## 2021-11-10 DIAGNOSIS — J309 Allergic rhinitis, unspecified: Secondary | ICD-10-CM | POA: Diagnosis not present

## 2021-12-01 ENCOUNTER — Other Ambulatory Visit: Payer: Self-pay | Admitting: Physician Assistant

## 2021-12-05 ENCOUNTER — Ambulatory Visit (INDEPENDENT_AMBULATORY_CARE_PROVIDER_SITE_OTHER): Payer: Medicare HMO

## 2021-12-05 DIAGNOSIS — J309 Allergic rhinitis, unspecified: Secondary | ICD-10-CM | POA: Diagnosis not present

## 2022-01-04 ENCOUNTER — Other Ambulatory Visit: Payer: Self-pay | Admitting: Physician Assistant

## 2022-01-09 ENCOUNTER — Ambulatory Visit (INDEPENDENT_AMBULATORY_CARE_PROVIDER_SITE_OTHER): Payer: Medicare HMO | Admitting: *Deleted

## 2022-01-09 DIAGNOSIS — J309 Allergic rhinitis, unspecified: Secondary | ICD-10-CM | POA: Diagnosis not present

## 2022-01-20 ENCOUNTER — Ambulatory Visit: Payer: Medicare HMO | Admitting: Physician Assistant

## 2022-01-23 ENCOUNTER — Ambulatory Visit (INDEPENDENT_AMBULATORY_CARE_PROVIDER_SITE_OTHER): Payer: Medicare HMO | Admitting: *Deleted

## 2022-01-23 DIAGNOSIS — J309 Allergic rhinitis, unspecified: Secondary | ICD-10-CM

## 2022-01-26 ENCOUNTER — Other Ambulatory Visit: Payer: Self-pay | Admitting: Physician Assistant

## 2022-02-21 ENCOUNTER — Ambulatory Visit (INDEPENDENT_AMBULATORY_CARE_PROVIDER_SITE_OTHER): Payer: Medicare HMO

## 2022-02-21 DIAGNOSIS — J309 Allergic rhinitis, unspecified: Secondary | ICD-10-CM | POA: Diagnosis not present

## 2022-02-26 ENCOUNTER — Other Ambulatory Visit: Payer: Self-pay | Admitting: Physician Assistant

## 2022-02-28 NOTE — Telephone Encounter (Signed)
Filled 10/18 appt 12/13

## 2022-03-01 ENCOUNTER — Ambulatory Visit (INDEPENDENT_AMBULATORY_CARE_PROVIDER_SITE_OTHER): Payer: Medicare HMO | Admitting: Physician Assistant

## 2022-03-01 ENCOUNTER — Encounter: Payer: Self-pay | Admitting: Physician Assistant

## 2022-03-01 DIAGNOSIS — F5105 Insomnia due to other mental disorder: Secondary | ICD-10-CM

## 2022-03-01 DIAGNOSIS — F411 Generalized anxiety disorder: Secondary | ICD-10-CM | POA: Diagnosis not present

## 2022-03-01 DIAGNOSIS — F319 Bipolar disorder, unspecified: Secondary | ICD-10-CM | POA: Diagnosis not present

## 2022-03-01 DIAGNOSIS — F99 Mental disorder, not otherwise specified: Secondary | ICD-10-CM | POA: Diagnosis not present

## 2022-03-01 MED ORDER — ALPRAZOLAM 0.5 MG PO TABS
0.5000 mg | ORAL_TABLET | Freq: Two times a day (BID) | ORAL | 5 refills | Status: DC | PRN
Start: 2022-03-01 — End: 2022-10-13

## 2022-03-01 MED ORDER — BUSPIRONE HCL 15 MG PO TABS: ORAL_TABLET | ORAL | 3 refills | Status: DC

## 2022-03-01 NOTE — Progress Notes (Signed)
[facility]  Patient ID: Candace Browning,  MRN: [id number]  PCP: Bartholome Bill, MD  Date of Evaluation: 03/01/2022 Time spent:30 minutes  Chief Complaint:  Chief Complaint   Anxiety; Depression; Follow-up    HISTORY/CURRENT STATUS: HPI For routine follow up.   Doing well. Patient is able to enjoy things.  Energy and motivation are good.  Work is going well, very busy at [employer]. Still in school for photography as well.  She has 2 more semesters.  No extreme sadness, tearfulness, or feelings of hopelessness.  Sleeps well most of the time. ADLs and personal hygiene are normal.   Denies any changes in concentration, making decisions, or remembering things.  Appetite has not changed.  Still gets anxious at times and does need the Xanax.  More commonly in the evening after work.  No panic attacks.  Xanax is still very helpful.  Denies suicidal or homicidal thoughts.  Patient denies increased energy with decreased need for sleep, increased talkativeness, racing thoughts, impulsivity or risky behaviors, increased spending, increased libido, grandiosity, increased irritability or anger, paranoia, or hallucinations.  Denies dizziness, syncope, seizures, numbness, tingling, tremor, tics, unsteady gait, slurred speech, confusion. Denies muscle or joint pain, stiffness, or dystonia. Denies unexplained weight loss, frequent infections, or sores that heal slowly.  No polyphagia, polydipsia, or polyuria. Denies visual changes or paresthesias.   Individual Medical History/ Review of Systems: Changes? :No    Past medications for mental health diagnoses include: BuSpar, Xanax, Latuda, Depakote did not work at all,  Vistaril, Lamictal caused SI, Valium, Seroquel, Abilify, Risperdal, lithium, Equetro, Geodon, prazosin, doxazosin, Luvox, Zoloft, Vraylar has TD at higher doses. Wellbutrin, Rexulti, Zyprexa caused confusion and stuttering, Gabapentin unsure if it helped or not. Artane no  help, Trileptal at doses higher than 300 mg bid caused severe vomiting and vertigo. Austedo at high doses caused extreme brain fog but helped the cough but even at low doses caused tremor in legs.   INGREZZA CAUSED SWELLING OF FACE AND RASH ALL OVER, NO SOB.  Allergies: Apple juice, Ingrezza [valbenazine tosylate], Coconut (cocos nucifera), Gluten meal, Latex, Nsaids, Other, Peanut-containing drug products, Sulfa antibiotics, Hydrogen peroxide, Penicillins, and Wheat bran  Current Medications:  Current Outpatient Medications:    buPROPion (WELLBUTRIN XL) 300 MG 24 hr tablet, TAKE 1 TABLET(300 MG) BY MOUTH DAILY, Disp: 90 tablet, Rfl: 3   levocetirizine (XYZAL) 5 MG tablet, Take 1 tablet once or twice daily as needed, Disp: 60 tablet, Rfl: 5   valACYclovir (VALTREX) 500 MG tablet, Take 1 tablet (500 mg total) by mouth 2 (two) times daily as needed (outbreaks)., Disp: 30 tablet, Rfl: 0   VRAYLAR 3 MG capsule, TAKE 1 CAPSULE(3 MG) BY MOUTH DAILY, Disp: 30 capsule, Rfl: 5   albuterol (VENTOLIN HFA) 108 (90 Base) MCG/ACT inhaler, Inhale 2 puffs into the lungs every 6 (six) hours as needed. (Patient not taking: Reported on 10/21/2021), Disp: 18 g, Rfl: 1   ALPRAZolam (XANAX) 0.5 MG tablet, Take 1 tablet (0.5 mg total) by mouth 2 (two) times daily as needed for anxiety., Disp: 60 tablet, Rfl: 5   Budeson-Glycopyrrol-Formoterol (BREZTRI AEROSPHERE) 160-9-4.8 MCG/ACT AERO, Inhale two puffs with spacer twice daily to prevent cough or wheeze.  Rinse, gargle, and spit after use. (Patient not taking: Reported on 04/01/2021), Disp: 10.7 g, Rfl: 5   busPIRone (BUSPAR) 15 MG tablet, TAKE 1 TABLET(15 MG) BY MOUTH TWICE DAILY, Disp: 180 tablet, Rfl: 3   EPINEPHrine 0.3 mg/0.3 mL IJ SOAJ injection,  Use as directed for life-threatening allergic reaction. (Patient not taking: Reported on 03/01/2022), Disp: 2 each, Rfl: 3   famotidine (PEPCID) 40 MG tablet, Take 1 tablet (40 mg total) by mouth in the morning and at bedtime.  (Patient not taking: Reported on 03/01/2022), Disp: 180 tablet, Rfl: 1   omeprazole (PRILOSEC) 40 MG capsule, Take one capsule twice daily as directed. (Patient not taking: Reported on 03/01/2022), Disp: 60 capsule, Rfl: 5 Medication Side Effects: none  Family Medical/ Social History: Changes? None  MENTAL HEALTH EXAM:  There were no vitals taken for this visit.There is no height or weight on file to calculate BMI.  General Appearance: Casual, Neat, and Well Groomed  Eye Contact:  Good  Speech:  Clear and Coherent and Normal Rate  Volume:  Normal  Mood:  Euthymic  Affect:  Congruent  Thought Process:  Goal Directed and Descriptions of Associations: Circumstantial  Orientation:  Full (Time, Place, and Person)  Thought Content: Logical   Suicidal Thoughts:  No  Homicidal Thoughts:  No  Memory:  WNL  Judgement:  Good  Insight:  Good  Psychomotor Activity:  Normal  Concentration:  Concentration: Good and Attention Span: Good  Recall:  Good  Fund of Knowledge: Good  Language: Good  Assets:  Desire for Improvement  ADL's:  Intact  Cognition: WNL  Prognosis:  Good   Labs 10/25/2021 CBC nl CMP nl Lipids nl A1C 5.3 Vit D 62.5  DIAGNOSES:    ICD-10-CM   1. Bipolar I disorder ([place])  F31.9     2. Insomnia due to other mental disorder  F51.05    F99     3. Generalized anxiety disorder  F41.1       Receiving Psychotherapy: Yes   with Lilia Argue.  RECOMMENDATIONS:  PDMP was reviewed.  Last Xanax filled 01/04/2022. I provided 30 minutes of face to face time during this encounter, including time spent before and after the visit in records review, medical decision making, counseling pertinent to today's visit, and charting.   She is doing well with current treatment so no change needed.  Continue Xanax 0.5 mg, 1 p.o. twice daily as needed.   Continue Wellbutrin XL 300 mg, 1 p.o. every morning.   Continue BuSpar 15 mg, 1 p.o. twice daily. Continue Vraylar 3 mg, 1 p.o.  daily.  Continue therapy with Lilia Argue. Return in 3 months.   Donnal Moat, PA-C

## 2022-03-07 ENCOUNTER — Ambulatory Visit (INDEPENDENT_AMBULATORY_CARE_PROVIDER_SITE_OTHER): Payer: Medicare HMO

## 2022-03-07 DIAGNOSIS — J309 Allergic rhinitis, unspecified: Secondary | ICD-10-CM

## 2022-03-29 ENCOUNTER — Ambulatory Visit (INDEPENDENT_AMBULATORY_CARE_PROVIDER_SITE_OTHER): Payer: Medicare HMO | Admitting: *Deleted

## 2022-03-29 DIAGNOSIS — J309 Allergic rhinitis, unspecified: Secondary | ICD-10-CM | POA: Diagnosis not present

## 2022-05-01 ENCOUNTER — Ambulatory Visit (INDEPENDENT_AMBULATORY_CARE_PROVIDER_SITE_OTHER): Payer: Medicare HMO

## 2022-05-01 DIAGNOSIS — J309 Allergic rhinitis, unspecified: Secondary | ICD-10-CM | POA: Diagnosis not present

## 2022-05-18 ENCOUNTER — Ambulatory Visit (INDEPENDENT_AMBULATORY_CARE_PROVIDER_SITE_OTHER): Payer: Medicare HMO

## 2022-05-18 DIAGNOSIS — J309 Allergic rhinitis, unspecified: Secondary | ICD-10-CM

## 2022-05-26 ENCOUNTER — Encounter: Payer: Self-pay | Admitting: Physician Assistant

## 2022-05-26 ENCOUNTER — Ambulatory Visit (INDEPENDENT_AMBULATORY_CARE_PROVIDER_SITE_OTHER): Payer: Medicare HMO | Admitting: Physician Assistant

## 2022-05-26 DIAGNOSIS — F411 Generalized anxiety disorder: Secondary | ICD-10-CM | POA: Diagnosis not present

## 2022-05-26 DIAGNOSIS — F99 Mental disorder, not otherwise specified: Secondary | ICD-10-CM

## 2022-05-26 DIAGNOSIS — F5105 Insomnia due to other mental disorder: Secondary | ICD-10-CM

## 2022-05-26 DIAGNOSIS — F319 Bipolar disorder, unspecified: Secondary | ICD-10-CM

## 2022-05-26 NOTE — Progress Notes (Signed)
[facility]  Patient ID: Candace Browning,  MRN: [id number]  PCP: Bartholome Bill, MD  Date of Evaluation: 05/26/2022 Time spent:20 minutes  Chief Complaint:  Chief Complaint   Depression; Anxiety; Follow-up    HISTORY/CURRENT STATUS: HPI For routine follow up.   Doing well, only problem is decreased appetite for the past few weeks. No known reason. Is eating but not finishing a meal. No manic sx at all.  Patient denies increased energy with decreased need for sleep, increased talkativeness, racing thoughts, impulsivity or risky behaviors, increased spending, increased libido, grandiosity, increased irritability or anger, paranoia, or hallucinations.  Patient is able to enjoy things.  Energy and motivation are good.  Work is going well.  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  No extreme sadness, tearfulness, or feelings of hopelessness.  Sleeps well most of the time but there are nights when she cannot go to sleep and will take a Xanax.  It does not work as well as it used to and then she will get up in a couple of hours and take another Xanax.  Asks if this is okay.  ADLs and personal hygiene are normal.   Denies any changes in concentration, making decisions, or remembering things.  Does have anxiety at times, Xanax is helpful.  She is not having panic attacks but does sometimes get overwhelmed with everything that is going on between school, work, and being a busy mom.  Denies suicidal or homicidal thoughts.  Denies dizziness, syncope, seizures, numbness, tingling, tremor, tics, unsteady gait, slurred speech, confusion. Denies muscle or joint pain, stiffness, or dystonia.  No tardive dyskinesia at this time.  Denies frequent infections, or sores that heal slowly.  No polyphagia, polydipsia, or polyuria. Denies visual changes or paresthesias.   Individual Medical History/ Review of Systems: Changes? :No    Past  medications for mental health diagnoses include: BuSpar, Xanax, Latuda, Depakote did not work at all,  Vistaril, Lamictal caused SI, Valium, Seroquel, Abilify, Risperdal, lithium, Equetro, Geodon, prazosin, doxazosin, Luvox, Zoloft, Vraylar has TD at higher doses. Wellbutrin, Rexulti, Zyprexa caused confusion and stuttering, Gabapentin unsure if it helped or not. Artane no help, Trileptal at doses higher than 300 mg bid caused severe vomiting and vertigo. Austedo at high doses caused extreme brain fog but helped the cough but even at low doses caused tremor in legs.   INGREZZA CAUSED SWELLING OF FACE AND RASH ALL OVER, NO SOB.  Allergies: Apple juice, Ingrezza [valbenazine tosylate], Coconut (cocos nucifera), Gluten meal, Latex, Nsaids, Other, Peanut-containing drug products, Sulfa antibiotics, Hydrogen peroxide, Penicillins, and Wheat  Current Medications:  Current Outpatient Medications:    albuterol (VENTOLIN HFA) 108 (90 Base) MCG/ACT inhaler, Inhale 2 puffs into the lungs every 6 (six) hours as needed., Disp: 18 g, Rfl: 1   ALPRAZolam (XANAX) 0.5 MG tablet, Take 1 tablet (0.5 mg total) by mouth 2 (two) times daily as needed for anxiety., Disp: 60 tablet, Rfl: 5   buPROPion (WELLBUTRIN XL) 300 MG 24 hr tablet, TAKE 1 TABLET(300 MG) BY MOUTH DAILY, Disp: 90 tablet, Rfl: 3   busPIRone (BUSPAR) 15 MG tablet, TAKE 1 TABLET(15 MG) BY MOUTH TWICE DAILY, Disp: 180 tablet, Rfl: 3   omeprazole (PRILOSEC) 40 MG capsule, Take one capsule twice daily as directed. (Patient taking differently: Take 20 mg by mouth daily.), Disp: 60 capsule, Rfl: 5   valACYclovir (VALTREX) 500 MG tablet, Take 1 tablet (500  mg total) by mouth 2 (two) times daily as needed (outbreaks)., Disp: 30 tablet, Rfl: 0   VRAYLAR 3 MG capsule, TAKE 1 CAPSULE(3 MG) BY MOUTH DAILY, Disp: 30 capsule, Rfl: 5   Budeson-Glycopyrrol-Formoterol (BREZTRI AEROSPHERE) 160-9-4.8 MCG/ACT AERO, Inhale two puffs with spacer twice daily to prevent cough or  wheeze.  Rinse, gargle, and spit after use. (Patient not taking: Reported on 04/01/2021), Disp: 10.7 g, Rfl: 5   EPINEPHrine 0.3 mg/0.3 mL IJ SOAJ injection, Use as directed for life-threatening allergic reaction. (Patient not taking: Reported on 03/01/2022), Disp: 2 each, Rfl: 3   levocetirizine (XYZAL) 5 MG tablet, Take 1 tablet once or twice daily as needed (Patient not taking: Reported on 05/26/2022), Disp: 60 tablet, Rfl: 5 Medication Side Effects: none  Family Medical/ Social History: Changes? None  MENTAL HEALTH EXAM:  There were no vitals taken for this visit.There is no height or weight on file to calculate BMI.  General Appearance: Casual, Neat, and Well Groomed  Eye Contact:  Good  Speech:  Clear and Coherent and Normal Rate  Volume:  Normal  Mood:  Euthymic  Affect:  Congruent  Thought Process:  Goal Directed and Descriptions of Associations: Circumstantial  Orientation:  Full (Time, Place, and Person)  Thought Content: Logical   Suicidal Thoughts:  No  Homicidal Thoughts:  No  Memory:  WNL  Judgement:  Good  Insight:  Good  Psychomotor Activity:  Normal  Concentration:  Concentration: Good and Attention Span: Good  Recall:  Good  Fund of Knowledge: Good  Language: Good  Assets:  Communication Skills Desire for Improvement Financial Resources/Insurance Housing Transportation Vocational/Educational  ADL's:  Intact  Cognition: WNL  Prognosis:  Good   DIAGNOSES:    ICD-10-CM   1. Bipolar I disorder ([place])  F31.9     2. Insomnia due to other mental disorder  F51.05    F99     3. Generalized anxiety disorder  F41.1       Receiving Psychotherapy: Yes   with Lilia Argue.  RECOMMENDATIONS:  PDMP was reviewed.  Last Xanax filled 04/12/2022. I provided 20 minutes of face to face time during this encounter, including time spent before and after the visit in records review, medical decision making, counseling pertinent to today's visit, and charting.   Doing  well for the most part, no change.  As far as the sleep goes, it is okay for her to take a total of 1 mg) 2 of the 0.5 mg pills) at night as needed sleep.  If this is needed most nights then I will increase the the dose of the pill when it is due next time.  She will continue 1 during the day as needed anxiety.  I understand she will run out sooner.  Continue Xanax 0.5 mg, 1 p.o. twice daily as needed.  (Ok to take 2 po qhs prn since 1 isn't helping her sleep right now.) Continue Wellbutrin XL 300 mg, 1 p.o. every morning.   Continue BuSpar 15 mg, 1 p.o. twice daily. Continue Vraylar 3 mg, 1 p.o. daily.  Continue therapy with Lilia Argue. Return in 4 months.   Donnal Moat, PA-C

## 2022-05-29 ENCOUNTER — Ambulatory Visit (INDEPENDENT_AMBULATORY_CARE_PROVIDER_SITE_OTHER): Payer: Medicare HMO | Admitting: *Deleted

## 2022-05-29 DIAGNOSIS — J309 Allergic rhinitis, unspecified: Secondary | ICD-10-CM

## 2022-06-27 ENCOUNTER — Ambulatory Visit (INDEPENDENT_AMBULATORY_CARE_PROVIDER_SITE_OTHER): Payer: Medicare HMO | Admitting: *Deleted

## 2022-06-27 DIAGNOSIS — J309 Allergic rhinitis, unspecified: Secondary | ICD-10-CM

## 2022-07-17 ENCOUNTER — Ambulatory Visit (INDEPENDENT_AMBULATORY_CARE_PROVIDER_SITE_OTHER): Payer: Medicare HMO | Admitting: *Deleted

## 2022-07-17 DIAGNOSIS — J309 Allergic rhinitis, unspecified: Secondary | ICD-10-CM

## 2022-07-29 ENCOUNTER — Other Ambulatory Visit: Payer: Self-pay | Admitting: Physician Assistant

## 2022-07-31 DIAGNOSIS — J302 Other seasonal allergic rhinitis: Secondary | ICD-10-CM | POA: Diagnosis not present

## 2022-07-31 NOTE — Progress Notes (Signed)
VIALS EXP 07-31-23 

## 2022-08-01 DIAGNOSIS — J301 Allergic rhinitis due to pollen: Secondary | ICD-10-CM | POA: Diagnosis not present

## 2022-08-02 DIAGNOSIS — J3081 Allergic rhinitis due to animal (cat) (dog) hair and dander: Secondary | ICD-10-CM | POA: Diagnosis not present

## 2022-08-09 ENCOUNTER — Ambulatory Visit (INDEPENDENT_AMBULATORY_CARE_PROVIDER_SITE_OTHER): Payer: Medicare HMO | Admitting: *Deleted

## 2022-08-09 DIAGNOSIS — J309 Allergic rhinitis, unspecified: Secondary | ICD-10-CM

## 2022-08-29 ENCOUNTER — Ambulatory Visit (INDEPENDENT_AMBULATORY_CARE_PROVIDER_SITE_OTHER): Payer: Medicare HMO

## 2022-08-29 DIAGNOSIS — J309 Allergic rhinitis, unspecified: Secondary | ICD-10-CM | POA: Diagnosis not present

## 2022-09-11 ENCOUNTER — Ambulatory Visit (INDEPENDENT_AMBULATORY_CARE_PROVIDER_SITE_OTHER): Payer: Medicare HMO | Admitting: *Deleted

## 2022-09-11 DIAGNOSIS — J309 Allergic rhinitis, unspecified: Secondary | ICD-10-CM

## 2022-09-27 ENCOUNTER — Ambulatory Visit: Payer: Self-pay

## 2022-09-27 DIAGNOSIS — J309 Allergic rhinitis, unspecified: Secondary | ICD-10-CM

## 2022-09-29 ENCOUNTER — Ambulatory Visit: Payer: Medicare HMO | Admitting: Physician Assistant

## 2022-09-29 ENCOUNTER — Other Ambulatory Visit: Payer: Self-pay | Admitting: Physician Assistant

## 2022-10-13 ENCOUNTER — Encounter: Payer: Self-pay | Admitting: Physician Assistant

## 2022-10-13 ENCOUNTER — Ambulatory Visit (INDEPENDENT_AMBULATORY_CARE_PROVIDER_SITE_OTHER): Payer: Medicare HMO | Admitting: Physician Assistant

## 2022-10-13 DIAGNOSIS — F411 Generalized anxiety disorder: Secondary | ICD-10-CM

## 2022-10-13 DIAGNOSIS — F319 Bipolar disorder, unspecified: Secondary | ICD-10-CM | POA: Diagnosis not present

## 2022-10-13 DIAGNOSIS — F5105 Insomnia due to other mental disorder: Secondary | ICD-10-CM

## 2022-10-13 DIAGNOSIS — F99 Mental disorder, not otherwise specified: Secondary | ICD-10-CM | POA: Diagnosis not present

## 2022-10-13 MED ORDER — CARIPRAZINE HCL 3 MG PO CAPS: 3.0000 mg | ORAL_CAPSULE | Freq: Every day | ORAL | 3 refills | Status: DC

## 2022-10-13 MED ORDER — ALPRAZOLAM 0.5 MG PO TABS: 0.5000 mg | ORAL_TABLET | Freq: Two times a day (BID) | ORAL | 5 refills | Status: DC | PRN

## 2022-10-13 NOTE — Progress Notes (Signed)
[facility]  Patient ID: Candace Browning,  MRN: [id number]  PCP: Verlon Au, MD  Date of Evaluation: 10/13/2022 Time spent:20 minutes  Chief Complaint:  Chief Complaint   Follow-up    HISTORY/CURRENT STATUS: HPI For routine follow up.   Doing well.  Patient is able to enjoy things.  Energy and motivation are good.  Work is going well.  Continues to work at [employer].  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  No extreme sadness, tearfulness, or feelings of hopelessness.  Sleeps well most of the time. ADLs and personal hygiene are normal.   Denies any changes in concentration, making decisions, or remembering things.  Appetite has not changed.  Weight is stable.  Anxiety is controlled. She does take the Xanax sometimes.  Not having PA, but does get overwhelmed and the Xanax helps. Denies suicidal or homicidal thoughts.  Patient denies increased energy with decreased need for sleep, increased talkativeness, racing thoughts, impulsivity or risky behaviors, increased spending, increased libido, grandiosity, increased irritability or anger, paranoia, or hallucinations.  Denies dizziness, syncope, seizures, numbness, tingling, tremor, tics, unsteady gait, slurred speech, confusion. Denies muscle or joint pain, stiffness, or dystonia.  No tardive dyskinesia at this time.  Denies frequent infections, or sores that heal slowly.  No polyphagia, polydipsia, or polyuria. Denies visual changes or paresthesias.   Individual Medical History/ Review of Systems: Changes? :No    Past medications for mental health diagnoses include: BuSpar, Xanax, Latuda, Depakote did not work at all,  Vistaril, Lamictal caused SI, Valium, Seroquel, Abilify, Risperdal, lithium, Equetro, Geodon, prazosin, doxazosin, Luvox, Zoloft, Vraylar has TD at higher doses. Wellbutrin, Rexulti, Zyprexa caused confusion and stuttering, Gabapentin unsure if it  helped or not. Artane no help, Trileptal at doses higher than 300 mg bid caused severe vomiting and vertigo. Austedo at high doses caused extreme brain fog but helped the cough but even at low doses caused tremor in legs.   INGREZZA CAUSED SWELLING OF FACE AND RASH ALL OVER, NO SOB.  Allergies: Apple juice, Ingrezza [valbenazine tosylate], Coconut (cocos nucifera), Gluten meal, Latex, Nsaids, Other, Peanut-containing drug products, Sulfa antibiotics, Hydrogen peroxide, Penicillins, and Wheat  Current Medications:  Current Outpatient Medications:    albuterol (VENTOLIN HFA) 108 (90 Base) MCG/ACT inhaler, Inhale 2 puffs into the lungs every 6 (six) hours as needed., Disp: 18 g, Rfl: 1   buPROPion (WELLBUTRIN XL) 300 MG 24 hr tablet, TAKE 1 TABLET(300 MG) BY MOUTH DAILY, Disp: 90 tablet, Rfl: 3   busPIRone (BUSPAR) 15 MG tablet, TAKE 1 TABLET(15 MG) BY MOUTH TWICE DAILY, Disp: 180 tablet, Rfl: 3   Ferrous Sulfate 90 (18 Fe) MG TABS, Take by mouth., Disp: , Rfl:    omeprazole (PRILOSEC) 40 MG capsule, Take one capsule twice daily as directed. (Patient taking differently: Take 20 mg by mouth daily.), Disp: 60 capsule, Rfl: 5   valACYclovir (VALTREX) 500 MG tablet, Take 1 tablet (500 mg total) by mouth 2 (two) times daily as needed (outbreaks)., Disp: 30 tablet, Rfl: 0   ALPRAZolam (XANAX) 0.5 MG tablet, Take 1 tablet (0.5 mg total) by mouth 2 (two) times daily as needed for anxiety., Disp: 60 tablet, Rfl: 5   Budeson-Glycopyrrol-Formoterol (BREZTRI AEROSPHERE) 160-9-4.8 MCG/ACT AERO, Inhale two puffs with spacer twice daily to prevent cough or wheeze.  Rinse, gargle, and spit after use. (Patient not taking: Reported on 04/01/2021), Disp: 10.7 g, Rfl: 5   cariprazine (VRAYLAR) 3 MG capsule,  Take 1 capsule (3 mg total) by mouth daily., Disp: 30 capsule, Rfl: 3   EPINEPHrine 0.3 mg/0.3 mL IJ SOAJ injection, Use as directed for life-threatening allergic reaction. (Patient not taking: Reported on 03/01/2022),  Disp: 2 each, Rfl: 3   levocetirizine (XYZAL) 5 MG tablet, Take 1 tablet once or twice daily as needed (Patient not taking: Reported on 05/26/2022), Disp: 60 tablet, Rfl: 5 Medication Side Effects: none  Family Medical/ Social History: Changes? None  MENTAL HEALTH EXAM:  There were no vitals taken for this visit.There is no height or weight on file to calculate BMI.  General Appearance: Casual and Well Groomed  Eye Contact:  Good  Speech:  Clear and Coherent and Normal Rate  Volume:  Normal  Mood:  Euthymic  Affect:  Congruent  Thought Process:  Goal Directed and Descriptions of Associations: Circumstantial  Orientation:  Full (Time, Place, and Person)  Thought Content: Logical   Suicidal Thoughts:  No  Homicidal Thoughts:  No  Memory:  WNL  Judgement:  Good  Insight:  Good  Psychomotor Activity:  Normal  Concentration:  Concentration: Good and Attention Span: Good  Recall:  Good  Fund of Knowledge: Good  Language: Good  Assets:  Communication Skills Desire for Improvement Financial Resources/Insurance Housing Transportation Vocational/Educational  ADL's:  Intact  Cognition: WNL  Prognosis:  Good   Labs from 07/25/2022 were reviewed.   Lipid panel normal, glucose normal  DIAGNOSES:    ICD-10-CM   1. Bipolar I disorder ([facility])  F31.9     2. Insomnia due to other mental disorder  F51.05    F99     3. Generalized anxiety disorder  F41.1       Receiving Psychotherapy: Yes   with Leta Speller.  RECOMMENDATIONS:  PDMP was reviewed.  Last Xanax filled 08/15/2022. I provided 20 minutes of face to face time during this encounter, including time spent before and after the visit in records review, medical decision making, counseling pertinent to today's visit, and charting.   She is doing really well so no changes need to be made. Congratulations on graduation from college!  Continue Xanax 0.5 mg, 1 p.o. twice daily as needed.  (Ok to take 2 po qhs prn since 1 isn't  helping her sleep right now.) Continue Wellbutrin XL 300 mg, 1 p.o. every morning.   Continue BuSpar 15 mg, 1 p.o. twice daily. Continue Vraylar 3 mg, 1 p.o. daily.  Continue therapy with Leta Speller. Return in 3-4 months.   Melony Overly, PA-C

## 2022-10-16 ENCOUNTER — Ambulatory Visit (INDEPENDENT_AMBULATORY_CARE_PROVIDER_SITE_OTHER): Payer: Medicare HMO | Admitting: *Deleted

## 2022-10-16 DIAGNOSIS — J309 Allergic rhinitis, unspecified: Secondary | ICD-10-CM | POA: Diagnosis not present

## 2022-10-31 ENCOUNTER — Other Ambulatory Visit: Payer: Self-pay

## 2022-10-31 ENCOUNTER — Emergency Department (HOSPITAL_COMMUNITY)
Admission: EM | Admit: 2022-10-31 | Discharge: 2022-10-31 | Disposition: A | Discharge status: Home / Self Care | Payer: Medicare HMO | Attending: Emergency Medicine | Admitting: Emergency Medicine

## 2022-10-31 ENCOUNTER — Encounter (HOSPITAL_COMMUNITY): Payer: Self-pay

## 2022-10-31 ENCOUNTER — Ambulatory Visit (INDEPENDENT_AMBULATORY_CARE_PROVIDER_SITE_OTHER): Payer: Medicare HMO

## 2022-10-31 DIAGNOSIS — Z91010 Allergy to peanuts: Secondary | ICD-10-CM | POA: Insufficient documentation

## 2022-10-31 DIAGNOSIS — Z91040 Latex allergy status: Secondary | ICD-10-CM | POA: Diagnosis not present

## 2022-10-31 DIAGNOSIS — W274XXA Contact with kitchen utensil, initial encounter: Secondary | ICD-10-CM | POA: Insufficient documentation

## 2022-10-31 DIAGNOSIS — J309 Allergic rhinitis, unspecified: Secondary | ICD-10-CM

## 2022-10-31 DIAGNOSIS — Z23 Encounter for immunization: Secondary | ICD-10-CM | POA: Diagnosis not present

## 2022-10-31 DIAGNOSIS — S61011A Laceration without foreign body of right thumb without damage to nail, initial encounter: Secondary | ICD-10-CM | POA: Insufficient documentation

## 2022-10-31 MED ORDER — TETANUS-DIPHTH-ACELL PERTUSSIS 5-2.5-18.5 LF-MCG/0.5 IM SUSY
0.5000 mL | PREFILLED_SYRINGE | Freq: Once | INTRAMUSCULAR | Status: AC
  Administered 2022-10-31: 0.5 mL via INTRAMUSCULAR
  Filled 2022-10-31: qty 0.5

## 2022-10-31 MED ORDER — OXYCODONE-ACETAMINOPHEN 5-325 MG PO TABS
1.0000 | ORAL_TABLET | Freq: Once | ORAL | Status: AC
  Administered 2022-10-31: 1 via ORAL
  Filled 2022-10-31: qty 1

## 2022-10-31 NOTE — ED Triage Notes (Signed)
Sliced portion of right thumb off with mandolin.   Pressure applied but continues to bleed.   Tetanus unknown.

## 2022-10-31 NOTE — Discharge Instructions (Signed)
Keep your thumb wound dry for 24 hours.  After that you can wash with regular soap and water.  Do not pick at the scab that forms over the wound.  Follow up in your doctor's clinic.  If you notice sausage-like swelling of your finger, or redness spreading down your hand, please return to the hospital.  These could be signs of an infection.  You can keep the cut covered with a bandaide for a few days at home.

## 2022-10-31 NOTE — ED Provider Notes (Signed)
[place] EMERGENCY DEPARTMENT AT [facility] Provider Note   CSN: [id number] Arrival date & time: 10/31/22  1929     History  Chief Complaint  Patient presents with   Finger Injury    ALLEYA SLOWIK is a 42 y.o. female presenting to ED with an accidental injury to the tip of her right thumb, cut on the mandolin. Not on a/c.  HPI     Home Medications Prior to Admission medications   Medication Sig Start Date End Date Taking? Authorizing Provider  albuterol (VENTOLIN HFA) 108 (90 Base) MCG/ACT inhaler Inhale 2 puffs into the lungs every 6 (six) hours as needed. 04/14/19   Kozlow, Alvira Philips, MD  ALPRAZolam Prudy Feeler) 0.5 MG tablet Take 1 tablet (0.5 mg total) by mouth 2 (two) times daily as needed for anxiety. 10/13/22   Cherie Ouch, PA-C  Budeson-Glycopyrrol-Formoterol (BREZTRI AEROSPHERE) 160-9-4.8 MCG/ACT AERO Inhale two puffs with spacer twice daily to prevent cough or wheeze.  Rinse, gargle, and spit after use. Patient not taking: Reported on 04/01/2021 07/26/20   Jessica Priest, MD  buPROPion (WELLBUTRIN XL) 300 MG 24 hr tablet TAKE 1 TABLET(300 MG) BY MOUTH DAILY 01/04/22   Claybon Jabs, Rosey Bath T, PA-C  busPIRone (BUSPAR) 15 MG tablet TAKE 1 TABLET(15 MG) BY MOUTH TWICE DAILY 03/01/22   Claybon Jabs, Rosey Bath T, PA-C  cariprazine (VRAYLAR) 3 MG capsule Take 1 capsule (3 mg total) by mouth daily. 10/13/22   Melony Overly T, PA-C  EPINEPHrine 0.3 mg/0.3 mL IJ SOAJ injection Use as directed for life-threatening allergic reaction. Patient not taking: Reported on 03/01/2022 03/08/20   Jessica Priest, MD  Ferrous Sulfate 90 (18 Fe) MG TABS Take by mouth.    [provider]  levocetirizine (XYZAL) 5 MG tablet Take 1 tablet once or twice daily as needed Patient not taking: Reported on 05/26/2022 12/27/20   Jessica Priest, MD  omeprazole (PRILOSEC) 40 MG capsule Take one capsule twice daily as directed. Patient taking differently: Take 20 mg by mouth daily. 03/08/20   Kozlow, Alvira Philips, MD   valACYclovir (VALTREX) 500 MG tablet Take 1 tablet (500 mg total) by mouth 2 (two) times daily as needed (outbreaks). 05/18/15   Adonis Brook, NP      Allergies    Apple juice, Annamaria Helling tosylate], Coconut (cocos nucifera), Gluten meal, Latex, Nsaids, Other, Peanut-containing drug products, Sulfa antibiotics, Hydrogen peroxide, Penicillins, and Wheat    Review of Systems   Review of Systems  Physical Exam Updated Vital Signs BP 119/84 (BP Location: Right Arm)   Pulse 75   Temp 98.4 F (36.9 C) (Oral)   Resp 16   SpO2 100%  Physical Exam Constitutional:      General: She is not in acute distress. HENT:     Head: Normocephalic and atraumatic.  Eyes:     Conjunctiva/sclera: Conjunctivae normal.     Pupils: Pupils are equal, round, and reactive to light.  Cardiovascular:     Rate and Rhythm: Normal rate and regular rhythm.  Pulmonary:     Effort: Pulmonary effort is normal. No respiratory distress.  Musculoskeletal:     Comments: Small fillet type injury to the lateral aspect of the pad of the right thumb, with active oozing and bleeding, no subungual hematoma  Skin:    General: Skin is warm and dry.  Neurological:     General: No focal deficit present.     Mental Status: She is alert. Mental status is at baseline.  Psychiatric:        Mood and Affect: Mood normal.        Behavior: Behavior normal.     ED Results / Procedures / Treatments   Labs (all labs ordered are listed, but only abnormal results are displayed) Labs Reviewed - No data to display  EKG None  Radiology No results found.  Procedures Procedures    Medications Ordered in ED Medications  Tdap (BOOSTRIX) injection 0.5 mL (has no administration in time range)  oxyCODONE-acetaminophen (PERCOCET/ROXICET) 5-325 MG per tablet 1 tablet (1 tablet Oral Given 10/31/22 2027)    ED Course/ Medical Decision Making/ A&P Clinical Course as of 10/31/22 2126  Tue Oct 31, 2022  2123 Pt  reassessed, no active bleeding now for 1 hour, will update tetanus and stable for discharge [MT]    Clinical Course User Index [MT] Renaye Rakers Kermit Balo, MD                                 Medical Decision Making Risk Prescription drug management.   Patient presenting with filet-type laceration to the edge of the right thumb.  Bleeding controlled with pressure, temporary use of finger tourniquet, ice, and quick-clot powder.  Pain meds given orally in ED  No indication or ability for suture repair with this type of injury.  No evidence of infection.  No indication for xrays - doubt underlying fracture, or open fracture.        Final Clinical Impression(s) / ED Diagnoses Final diagnoses:  Laceration of right thumb without foreign body without damage to nail, initial encounter    Rx / DC Orders ED Discharge Orders     None         Terald Sleeper, MD 10/31/22 2126

## 2022-11-06 ENCOUNTER — Ambulatory Visit: Payer: Medicare HMO | Admitting: Allergy and Immunology

## 2022-11-13 ENCOUNTER — Encounter: Payer: Self-pay | Admitting: Allergy and Immunology

## 2022-11-13 ENCOUNTER — Ambulatory Visit: Payer: Medicare HMO | Admitting: Allergy and Immunology

## 2022-11-13 VITALS — BP 96/72 | HR 72 | Resp 12 | Ht 66.5 in | Wt 139.6 lb

## 2022-11-13 DIAGNOSIS — J309 Allergic rhinitis, unspecified: Secondary | ICD-10-CM

## 2022-11-13 DIAGNOSIS — Z91018 Allergy to other foods: Secondary | ICD-10-CM | POA: Diagnosis not present

## 2022-11-13 DIAGNOSIS — J3089 Other allergic rhinitis: Secondary | ICD-10-CM

## 2022-11-13 DIAGNOSIS — J4540 Moderate persistent asthma, uncomplicated: Secondary | ICD-10-CM | POA: Diagnosis not present

## 2022-11-13 DIAGNOSIS — K219 Gastro-esophageal reflux disease without esophagitis: Secondary | ICD-10-CM

## 2022-11-13 MED ORDER — AIRSUPRA 90-80 MCG/ACT IN AERO: 2.0000 | INHALATION_SPRAY | RESPIRATORY_TRACT | 1 refills | Status: DC | PRN

## 2022-11-13 NOTE — Progress Notes (Unsigned)
[place] [facility] [place] - [place] - [place] - [place]   Follow-up Note  Referring Provider: Verlon Au, MD Primary Provider: Verlon Au, MD Date of Office Visit: 11/13/2022  Subjective:   Candace Browning (DOB: [birth year]) is a 42 y.o. female who [facility] in re-evaluation of the following:  HPI: Candace Browning returns to this clinic in evaluation of asthma, allergic rhinitis, food allergy directed against peanut and tree nut, LPR.  I last saw her in this clinic 11 April 2021.  Her asthma is been under excellent control and Candace Browning rarely uses a short acting bronchodilator Candace Browning can exert herself without any problem.  Her upper airway has been doing quite well and Candace Browning does not use any nasal steroid at this point in time.  Candace Browning has not required a systemic steroid or an antibiotic for any type of airway issue.  Candace Browning continues on immunotherapy currently at every 4 weeks without any adverse effect.  Candace Browning has had very little problems with reflux or her throat and Candace Browning has been able to taper off her omeprazole and famotidine.  Candace Browning does not consume peanuts or tree nuts.  Candace Browning does not receive the flu vaccine.  Allergies as of 11/13/2022       Reactions   Apple Juice Anaphylaxis   Ingrezza [valbenazine Tosylate] Swelling   Swelling of face, rash all over, no SOB   Coconut (cocos Nucifera)    Gluten Meal    All breads   Latex    Nsaids Other (See Comments)   Thin basement membrane disease, advised to avoid NSAIDS   Other    PT HAD FOOD ALLERGIES:APPLES, WHEAT, AND all nuts   Peanut-containing Drug Products    Sulfa Antibiotics    Hydrogen Peroxide Itching, Rash   Penicillins Rash   Has patient had a PCN reaction causing immediate rash, facial/tongue/throat swelling, SOB or lightheadedness with hypotension: yes  Has patient had a PCN reaction causing severe rash involving mucus membranes or skin necrosis: no Has patient had  a PCN reaction that required hospitalization: no  Has patient had a PCN reaction occurring within the last 10 years: no If all of the above answers are "NO", then may proceed with Cephalosporin use.   Wheat Rash        Medication List   albuterol 108 (90 Base) MCG/ACT inhaler Commonly known as: VENTOLIN HFA Inhale 2 puffs into the lungs every 6 (six) hours as needed.   ALPRAZolam 0.5 MG tablet Commonly known as: XANAX Take 1 tablet (0.5 mg total) by mouth 2 (two) times daily as needed for anxiety.   buPROPion 300 MG 24 hr tablet Commonly known as: WELLBUTRIN XL TAKE 1 TABLET(300 MG) BY MOUTH DAILY   busPIRone 15 MG tablet Commonly known as: BUSPAR TAKE 1 TABLET(15 MG) BY MOUTH TWICE DAILY   cariprazine 3 MG capsule Commonly known as: Vraylar Take 1 capsule (3 mg total) by mouth daily.   EPINEPHrine 0.3 mg/0.3 mL Soaj injection Commonly known as: EPI-PEN Use as directed for life-threatening allergic reaction.   Ferrous Sulfate 90 (18 Fe) MG Tabs Take by mouth.   levocetirizine 5 MG tablet Commonly known as: XYZAL Take 1 tablet once or twice daily as needed   Nasacort Allergy 24HR 55 MCG/ACT Aero nasal inhaler Generic drug: triamcinolone Place 2 sprays into the nose daily as needed.   valACYclovir 500 MG tablet Commonly known as: VALTREX Take 1 tablet (500 mg total) by  mouth 2 (two) times daily as needed (outbreaks).   VITAMIN D2 PO Take by mouth daily.    Past Medical History:  Diagnosis Date   Allergic rhinitis    Anemia    Anxiety    Asthma    Atopic dermatitis    Bipolar 1 disorder (HCC)    Bipolar 1 disorder, mixed, mild (HCC) 03/29/2017   Eczema    Food allergy    Herpes simplex without mention of complication    HSV2   Renal disorder     Past Surgical History:  Procedure Laterality Date   WISDOM TOOTH EXTRACTION      Review of systems negative except as noted in HPI / PMHx or noted below:  Review of Systems  Constitutional: Negative.    HENT: Negative.    Eyes: Negative.   Respiratory: Negative.    Cardiovascular: Negative.   Gastrointestinal: Negative.   Genitourinary: Negative.   Musculoskeletal: Negative.   Skin: Negative.   Neurological: Negative.   Endo/Heme/Allergies: Negative.   Psychiatric/Behavioral: Negative.       Objective:   Vitals:   11/13/22 1050  BP: 96/72  Pulse: 72  Resp: 12  SpO2: 98%   Height: 5' 6.5" (168.9 cm)  Weight: 139 lb 9.6 oz (63.3 kg)   Physical Exam Constitutional:      Appearance: Candace Browning is not diaphoretic.  HENT:     Head: Normocephalic.     Right Ear: Tympanic membrane, ear canal and external ear normal.     Left Ear: Tympanic membrane, ear canal and external ear normal.     Nose: Nose normal. No mucosal edema or rhinorrhea.     Mouth/Throat:     Pharynx: Uvula midline. No oropharyngeal exudate.  Eyes:     Conjunctiva/sclera: Conjunctivae normal.  Neck:     Thyroid: No thyromegaly.     Trachea: Trachea normal. No tracheal tenderness or tracheal deviation.  Cardiovascular:     Rate and Rhythm: Normal rate and regular rhythm.     Heart sounds: Normal heart sounds, S1 normal and S2 normal. No murmur heard. Pulmonary:     Effort: No respiratory distress.     Breath sounds: Normal breath sounds. No stridor. No wheezing or rales.  Lymphadenopathy:     Head:     Right side of head: No tonsillar adenopathy.     Left side of head: No tonsillar adenopathy.     Cervical: No cervical adenopathy.  Skin:    Findings: No erythema or rash.     Nails: There is no clubbing.  Neurological:     Mental Status: Candace Browning is alert.     Diagnostics: none  Assessment and Plan:   1. Allergic rhinitis, unspecified seasonality, unspecified trigger   2. Asthma, moderate persistent, well-controlled   3. Other allergic rhinitis   4. LPRD (laryngopharyngeal reflux disease)   5. Food allergy    1.  Continue allergen avoidance measures - peanuts / tree nuts   2.  Continue to treat and  prevent inflammation with the following:   A. Immunotherapy  3.  Continue the following if needed:   A.  Xyzal 5 mg - 1 tablet 1 time per day  B.  EpiPen  C.  AIRSUPRA -2 inhalations every 4-6 hours  D.  OTC Mucinex DM - 1-2 tablets 1-2 times per day  E.  Omeprazole 40 mg -1 tablet twice a day  F.  Famotidine 40 mg -1 tablet twice a day  G. OTC Nasacort -  1 spray each nostril 1-2 times per day  4. Return to clinic in 12 months or earlier if problem  Candace Browning appears to be doing quite well with minimal amounts of medications and Candace Browning will continue on immunotherapy and Candace Browning has a selection of agents to be utilized for her atopic disease and for her reflux disease should be required as noted above.  If Candace Browning does well I will see her back in this clinic in 1 year or earlier if there is a problem.  Candace Schimke, MD Allergy / Immunology [facility]

## 2022-11-13 NOTE — Patient Instructions (Signed)
   1.  Continue allergen avoidance measures - peanuts / tree nuts   2.  Continue to treat and prevent inflammation with the following:   A. Immunotherapy  3.  Continue the following if needed:   A.  Xyzal 5 mg - 1 tablet 1 time per day  B.  EpiPen  C.  AIRSUPRA -2 inhalations every 4-6 hours  D.  OTC Mucinex DM - 1-2 tablets 1-2 times per day  E.  Omeprazole 40 mg -1 tablet twice a day  F.  Famotidine 40 mg -1 tablet twice a day  G. OTC Nasacort - 1 spray each nostril 1-2 times per day  4. Return to clinic in 12 months or earlier if problem

## 2022-11-14 ENCOUNTER — Encounter: Payer: Self-pay | Admitting: Allergy and Immunology

## 2022-12-13 ENCOUNTER — Ambulatory Visit (INDEPENDENT_AMBULATORY_CARE_PROVIDER_SITE_OTHER): Payer: Medicare HMO | Admitting: *Deleted

## 2022-12-13 DIAGNOSIS — J309 Allergic rhinitis, unspecified: Secondary | ICD-10-CM | POA: Diagnosis not present

## 2023-01-09 ENCOUNTER — Other Ambulatory Visit: Payer: Self-pay | Admitting: Physician Assistant

## 2023-01-09 NOTE — Telephone Encounter (Signed)
Has appt 10/25

## 2023-01-10 ENCOUNTER — Ambulatory Visit (INDEPENDENT_AMBULATORY_CARE_PROVIDER_SITE_OTHER): Payer: Medicare HMO

## 2023-01-10 DIAGNOSIS — J309 Allergic rhinitis, unspecified: Secondary | ICD-10-CM | POA: Diagnosis not present

## 2023-01-12 ENCOUNTER — Encounter: Payer: Self-pay | Admitting: Physician Assistant

## 2023-01-12 ENCOUNTER — Ambulatory Visit (INDEPENDENT_AMBULATORY_CARE_PROVIDER_SITE_OTHER): Payer: Medicare HMO | Admitting: Physician Assistant

## 2023-01-12 DIAGNOSIS — F99 Mental disorder, not otherwise specified: Secondary | ICD-10-CM | POA: Diagnosis not present

## 2023-01-12 DIAGNOSIS — F411 Generalized anxiety disorder: Secondary | ICD-10-CM | POA: Diagnosis not present

## 2023-01-12 DIAGNOSIS — F319 Bipolar disorder, unspecified: Secondary | ICD-10-CM

## 2023-01-12 DIAGNOSIS — F5105 Insomnia due to other mental disorder: Secondary | ICD-10-CM | POA: Diagnosis not present

## 2023-01-12 MED ORDER — BUPROPION HCL ER (XL) 300 MG PO TB24: ORAL_TABLET | ORAL | 3 refills | Status: DC

## 2023-01-12 MED ORDER — BUSPIRONE HCL 15 MG PO TABS: ORAL_TABLET | ORAL | 1 refills | Status: DC

## 2023-01-12 NOTE — Progress Notes (Unsigned)
[facility]  Patient ID: Candace Browning,  MRN: [id number]  PCP: Verlon Au, MD  Date of Evaluation: 01/12/2023 Time spent:30 minutes  Chief Complaint:  Chief Complaint   Follow-up    HISTORY/CURRENT STATUS: HPI For routine follow up.   For about 3 weeks, has felt a little down, 'not quite right but not depressed though.' More anxious, no PA.  Just generally overwhelmed.  Hard time relaxing.  Xanax is beneficial though.  She tries not to take it often.  She is still working at [employer] which is very stressful.  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    She is able to enjoy things.  Energy and motivation are good most of the time.  She is not sleeping more than usual.  She feels rested when she gets up.  No extreme sadness, tearfulness, or feelings of hopelessness.  ADLs and personal hygiene are normal.   Denies any changes in concentration, making decisions, or remembering things.  Appetite has not changed.  Weight is stable.  Denies suicidal or homicidal thoughts.  Patient denies increased energy with decreased need for sleep, increased talkativeness, racing thoughts, impulsivity or risky behaviors, increased spending, increased libido, grandiosity, increased irritability or anger, paranoia, or hallucinations.  Denies dizziness, syncope, seizures, numbness, tingling, tremor, tics, unsteady gait, slurred speech, confusion. Denies muscle or joint pain, stiffness, or dystonia.  No tardive dyskinesia at this time.  Denies frequent infections, or sores that heal slowly.  No polyphagia, polydipsia, or polyuria. Denies visual changes or paresthesias.   Individual Medical History/ Review of Systems: Changes? :Yes  was at work and reaching up to get something and a bin of lettuce fell, hit her in the head. No LOC. On left forehead, she didn't go to get medical eval. Still hurts. No n/v or confusion, dizziness.   Past medications for mental  health diagnoses include: BuSpar, Xanax, Latuda, Depakote did not work at all,  Vistaril, Lamictal caused SI, Valium, Seroquel, Abilify, Risperdal, lithium, Equetro, Geodon, prazosin, doxazosin, Luvox, Zoloft, Vraylar has TD at higher doses. Wellbutrin, Rexulti, Zyprexa caused confusion and stuttering, Gabapentin unsure if it helped or not. Artane no help, Trileptal at doses higher than 300 mg bid caused severe vomiting and vertigo. Austedo at high doses caused extreme brain fog but helped the cough but even at low doses caused tremor in legs.   INGREZZA CAUSED SWELLING OF FACE AND RASH ALL OVER, NO SOB.  Allergies: Apple juice, Ingrezza [valbenazine tosylate], Coconut (cocos nucifera), Gluten meal, Latex, Nsaids, Other, Peanut-containing drug products, Sulfa antibiotics, Hydrogen peroxide, Penicillins, and Wheat  Current Medications:  Current Outpatient Medications:    albuterol (VENTOLIN HFA) 108 (90 Base) MCG/ACT inhaler, Inhale 2 puffs into the lungs every 6 (six) hours as needed., Disp: 18 g, Rfl: 1   Albuterol-Budesonide (AIRSUPRA) 90-80 MCG/ACT AERO, Inhale 2 puffs into the lungs as needed (every four to six hours for cough, wheeze, shortness of breath.  Rinse, gargle, and spit after use)., Disp: 10.7 g, Rfl: 1   ALPRAZolam (XANAX) 0.5 MG tablet, Take 1 tablet (0.5 mg total) by mouth 2 (two) times daily as needed for anxiety., Disp: 60 tablet, Rfl: 5   cariprazine (VRAYLAR) 3 MG capsule, Take 1 capsule (3 mg total) by mouth daily., Disp: 30 capsule, Rfl: 3   Ergocalciferol (VITAMIN D2 PO), Take by mouth daily., Disp: , Rfl:    Ferrous Sulfate 90 (18 Fe) MG TABS, Take by mouth., Disp: ,  Rfl:    levocetirizine (XYZAL) 5 MG tablet, Take 1 tablet once or twice daily as needed, Disp: 60 tablet, Rfl: 5   triamcinolone (NASACORT ALLERGY 24HR) 55 MCG/ACT AERO nasal inhaler, Place 2 sprays into the nose daily as needed., Disp: , Rfl:    valACYclovir (VALTREX) 500 MG tablet, Take 1 tablet (500 mg  total) by mouth 2 (two) times daily as needed (outbreaks)., Disp: 30 tablet, Rfl: 0   buPROPion (WELLBUTRIN XL) 300 MG 24 hr tablet, TAKE 1 TABLET(300 MG) BY MOUTH DAILY, Disp: 90 tablet, Rfl: 3   busPIRone (BUSPAR) 15 MG tablet, 2 po q am, 1 po at bedtime., Disp: 270 tablet, Rfl: 1   EPINEPHrine 0.3 mg/0.3 mL IJ SOAJ injection, Use as directed for life-threatening allergic reaction. (Patient not taking: Reported on 01/12/2023), Disp: 2 each, Rfl: 3 Medication Side Effects: none  Family Medical/ Social History: Changes? None  MENTAL HEALTH EXAM:  There were no vitals taken for this visit.There is no height or weight on file to calculate BMI.  General Appearance: Casual and Well Groomed  Eye Contact:  Good  Speech:  Clear and Coherent and Normal Rate  Volume:  Normal  Mood:  Anxious and sad  Affect:  Congruent  Thought Process:  Goal Directed and Descriptions of Associations: Circumstantial  Orientation:  Full (Time, Place, and Person)  Thought Content: Logical   Suicidal Thoughts:  No  Homicidal Thoughts:  No  Memory:  WNL  Judgement:  Good  Insight:  Good  Psychomotor Activity:  Normal  Concentration:  Concentration: Good and Attention Span: Good  Recall:  Good  Fund of Knowledge: Good  Language: Good  Assets:  Communication Skills Desire for Improvement Financial Resources/Insurance Housing Resilience Transportation Vocational/Educational  ADL's:  Intact  Cognition: WNL  Prognosis:  Good   DIAGNOSES:    ICD-10-CM   1. Generalized anxiety disorder  F41.1     2. Bipolar depression (HCC)  F31.9     3. Insomnia due to other mental disorder  F51.05    F99      Receiving Psychotherapy: Yes   with Leta Speller.  RECOMMENDATIONS:  PDMP was reviewed.  Last Xanax filled 12/26/2022.  Given a few hydrocodone 01/04/2023. I provided 30 minutes of face to face time during this encounter, including time spent before and after the visit in records review, medical decision  making, counseling pertinent to today's visit, and charting.   We discussed the anxiety and depression.  I think the anxiety is what is causing the depression right now.  She is overwhelmed and not where she wants to be as far as her career path goes.  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  Her job at [employer] is extremely stressful, it would be nice for her to be out of there.  I recommend increasing the BuSpar.  She agrees.  Continue Xanax 0.5 mg, 1 p.o. twice daily as needed.  (Ok to take 2 po qhs prn since 1 isn't helping her sleep right now.) Continue Wellbutrin XL 300 mg, 1 p.o. every morning.   Increase BuSpar 15 mg to 1 p.o. 3 times daily if she can remember taking the middle of the day dose, if not she will do 2 p.o. every morning and 1 p.o. nightly. Continue Vraylar 3 mg, 1 p.o. daily.  Continue therapy with Leta Speller. Return in 4 weeks.  Melony Overly, PA-C

## 2023-01-31 ENCOUNTER — Ambulatory Visit (INDEPENDENT_AMBULATORY_CARE_PROVIDER_SITE_OTHER): Payer: Medicare HMO | Admitting: *Deleted

## 2023-01-31 DIAGNOSIS — J309 Allergic rhinitis, unspecified: Secondary | ICD-10-CM

## 2023-02-09 ENCOUNTER — Ambulatory Visit: Payer: Medicare HMO | Admitting: Physician Assistant

## 2023-02-26 ENCOUNTER — Ambulatory Visit: Payer: Medicare HMO | Admitting: Physician Assistant

## 2023-03-08 ENCOUNTER — Ambulatory Visit (INDEPENDENT_AMBULATORY_CARE_PROVIDER_SITE_OTHER): Payer: Medicare HMO | Admitting: *Deleted

## 2023-03-08 DIAGNOSIS — J309 Allergic rhinitis, unspecified: Secondary | ICD-10-CM

## 2023-03-18 ENCOUNTER — Other Ambulatory Visit: Payer: Self-pay | Admitting: Physician Assistant

## 2023-03-22 DIAGNOSIS — J301 Allergic rhinitis due to pollen: Secondary | ICD-10-CM | POA: Diagnosis not present

## 2023-03-22 NOTE — Progress Notes (Signed)
 VIALS EXP 03-21-24

## 2023-03-23 DIAGNOSIS — J3081 Allergic rhinitis due to animal (cat) (dog) hair and dander: Secondary | ICD-10-CM | POA: Diagnosis not present

## 2023-03-26 DIAGNOSIS — J302 Other seasonal allergic rhinitis: Secondary | ICD-10-CM | POA: Diagnosis not present

## 2023-04-10 ENCOUNTER — Encounter: Payer: Self-pay | Admitting: Physician Assistant

## 2023-04-10 ENCOUNTER — Ambulatory Visit (INDEPENDENT_AMBULATORY_CARE_PROVIDER_SITE_OTHER): Payer: Medicare HMO

## 2023-04-10 ENCOUNTER — Ambulatory Visit (INDEPENDENT_AMBULATORY_CARE_PROVIDER_SITE_OTHER): Payer: Medicare HMO | Admitting: Physician Assistant

## 2023-04-10 DIAGNOSIS — F411 Generalized anxiety disorder: Secondary | ICD-10-CM | POA: Diagnosis not present

## 2023-04-10 DIAGNOSIS — J309 Allergic rhinitis, unspecified: Secondary | ICD-10-CM

## 2023-04-10 DIAGNOSIS — Z566 Other physical and mental strain related to work: Secondary | ICD-10-CM | POA: Diagnosis not present

## 2023-04-10 DIAGNOSIS — F319 Bipolar disorder, unspecified: Secondary | ICD-10-CM

## 2023-04-10 DIAGNOSIS — F5105 Insomnia due to other mental disorder: Secondary | ICD-10-CM | POA: Diagnosis not present

## 2023-04-10 DIAGNOSIS — F99 Mental disorder, not otherwise specified: Secondary | ICD-10-CM

## 2023-04-10 MED ORDER — CARIPRAZINE HCL 3 MG PO CAPS: 3.0000 mg | ORAL_CAPSULE | Freq: Every day | ORAL | 5 refills | Status: DC

## 2023-04-10 MED ORDER — ALPRAZOLAM 0.5 MG PO TABS: 0.5000 mg | ORAL_TABLET | Freq: Two times a day (BID) | ORAL | 5 refills | Status: DC | PRN

## 2023-04-10 NOTE — Progress Notes (Signed)
[facility]  Patient ID: Candace Browning,  MRN: [id number]  PCP: Verlon Au, MD  Date of Evaluation: 04/10/2023 Time spent:20 minutes  Chief Complaint:  Chief Complaint   Anxiety; Depression; Follow-up    HISTORY/CURRENT STATUS: HPI For routine follow up.   For the most part, she's doing well. Work is still very stressful, at 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 She may have a good lead. Work causes anxiety, gets overwhelmed. No PA. Xanax helps when needed.   Patient is able to enjoy things.  Energy and motivation are good.   No extreme sadness, tearfulness, or feelings of hopelessness.  Sleeps well most of the time. ADLs and personal hygiene are normal.   Denies any changes in concentration, making decisions, or remembering things.  Appetite has not changed.  Weight is stable.  Denies suicidal or homicidal thoughts.  Patient denies increased energy with decreased need for sleep, increased talkativeness, racing thoughts, impulsivity or risky behaviors, increased spending, increased libido, grandiosity, increased irritability or anger, paranoia, or hallucinations.  Denies dizziness, syncope, seizures, numbness, tingling, tremor, tics, unsteady gait, slurred speech, confusion. Denies muscle or joint pain, stiffness, or dystonia.  No tardive dyskinesia at this time.  Denies frequent infections, or sores that heal slowly.  No polyphagia, polydipsia, or polyuria. Denies visual changes or paresthesias.   Individual Medical History/ Review of Systems: Changes? :Yes    Past medications for mental health diagnoses include: BuSpar, Xanax, Latuda, Depakote did not work at all,  Vistaril, Lamictal caused SI, Valium, Seroquel, Abilify, Risperdal, lithium, Equetro, Geodon, prazosin, doxazosin, Luvox, Zoloft, Vraylar has TD at higher doses. Wellbutrin, Rexulti, Zyprexa caused confusion and stuttering, Gabapentin unsure if it helped or not.  Artane no help, Trileptal at doses higher than 300 mg bid caused severe vomiting and vertigo. Austedo at high doses caused extreme brain fog but helped the cough but even at low doses caused tremor in legs.   INGREZZA CAUSED SWELLING OF FACE AND RASH ALL OVER, NO SOB.  Allergies: Apple juice, Ingrezza [valbenazine tosylate], Coconut (cocos nucifera), Gluten meal, Latex, Nsaids, Other, Peanut-containing drug products, Sulfa antibiotics, Hydrogen peroxide, Penicillins, and Wheat  Current Medications:  Current Outpatient Medications:    buPROPion (WELLBUTRIN XL) 300 MG 24 hr tablet, TAKE 1 TABLET(300 MG) BY MOUTH DAILY, Disp: 90 tablet, Rfl: 3   busPIRone (BUSPAR) 15 MG tablet, 2 po q am, 1 po at bedtime., Disp: 270 tablet, Rfl: 1   Ergocalciferol (VITAMIN D2 PO), Take by mouth daily., Disp: , Rfl:    Ferrous Sulfate 90 (18 Fe) MG TABS, Take by mouth., Disp: , Rfl:    albuterol (VENTOLIN HFA) 108 (90 Base) MCG/ACT inhaler, Inhale 2 puffs into the lungs every 6 (six) hours as needed. (Patient not taking: Reported on 04/10/2023), Disp: 18 g, Rfl: 1   Albuterol-Budesonide (AIRSUPRA) 90-80 MCG/ACT AERO, Inhale 2 puffs into the lungs as needed (every four to six hours for cough, wheeze, shortness of breath.  Rinse, gargle, and spit after use). (Patient not taking: Reported on 04/10/2023), Disp: 10.7 g, Rfl: 1   ALPRAZolam (XANAX) 0.5 MG tablet, Take 1 tablet (0.5 mg total) by mouth 2 (two) times daily as needed for anxiety., Disp: 60 tablet, Rfl: 5   cariprazine (VRAYLAR) 3 MG capsule, Take 1 capsule (3 mg total) by mouth daily., Disp: 30 capsule, Rfl: 5   EPINEPHrine 0.3 mg/0.3 mL IJ SOAJ injection, Use as directed for life-threatening allergic reaction. (Patient  not taking: Reported on 04/10/2023), Disp: 2 each, Rfl: 3   levocetirizine (XYZAL) 5 MG tablet, Take 1 tablet once or twice daily as needed (Patient not taking: Reported on 04/10/2023), Disp: 60 tablet, Rfl: 5   triamcinolone (NASACORT ALLERGY 24HR)  55 MCG/ACT AERO nasal inhaler, Place 2 sprays into the nose daily as needed. (Patient not taking: Reported on 04/10/2023), Disp: , Rfl:    valACYclovir (VALTREX) 500 MG tablet, Take 1 tablet (500 mg total) by mouth 2 (two) times daily as needed (outbreaks). (Patient not taking: Reported on 04/10/2023), Disp: 30 tablet, Rfl: 0 Medication Side Effects: none  Family Medical/ Social History: Changes? None  MENTAL HEALTH EXAM:  There were no vitals taken for this visit.There is no height or weight on file to calculate BMI.  General Appearance: Casual and Well Groomed  Eye Contact:  Good  Speech:  Clear and Coherent and Normal Rate  Volume:  Normal  Mood:  Euthymic  Affect:  Congruent  Thought Process:  Goal Directed and Descriptions of Associations: Circumstantial  Orientation:  Full (Time, Place, and Person)  Thought Content: Logical   Suicidal Thoughts:  No  Homicidal Thoughts:  No  Memory:  WNL  Judgement:  Good  Insight:  Good  Psychomotor Activity:  Normal  Concentration:  Concentration: Good and Attention Span: Good  Recall:  Good  Fund of Knowledge: Good  Language: Good  Assets:  Communication Skills Desire for Improvement Financial Resources/Insurance Housing Resilience Transportation Vocational/Educational  ADL's:  Intact  Cognition: WNL  Prognosis:  Good   PCP follows labs  DIAGNOSES:    ICD-10-CM   1. Bipolar I disorder (HCC)  F31.9     2. Generalized anxiety disorder  F41.1     3. Insomnia due to other mental disorder  F51.05    F99     4. Work stress  Z56.6       Receiving Psychotherapy: Yes   with Leta Speller.  RECOMMENDATIONS:  PDMP was reviewed.  Last Xanax filled 12/26/2022.  Given a few hydrocodone 01/04/2023. I provided 20 minutes of face to face time during this encounter, including time spent before and after the visit in records review, medical decision making, counseling pertinent to today's visit, and charting.   She is doing well as far  as her mental health meds go so no changes will be made.   Continue Xanax 0.5 mg, 1 p.o. twice daily as needed.  (Ok to take 2 po qhs prn since 1 isn't helping her sleep right now.) Continue Wellbutrin XL 300 mg, 1 p.o. every morning.   Continue BuSpar 15 mg to 1 p.o. 3 times daily. Continue Vraylar 3 mg, 1 p.o. daily.  Continue therapy with Leta Speller. Return in 3 months.  Melony Overly, PA-C

## 2023-05-14 ENCOUNTER — Ambulatory Visit (INDEPENDENT_AMBULATORY_CARE_PROVIDER_SITE_OTHER): Payer: Self-pay | Admitting: *Deleted

## 2023-05-14 DIAGNOSIS — J309 Allergic rhinitis, unspecified: Secondary | ICD-10-CM | POA: Diagnosis not present

## 2023-05-23 ENCOUNTER — Ambulatory Visit (INDEPENDENT_AMBULATORY_CARE_PROVIDER_SITE_OTHER): Payer: Self-pay

## 2023-05-23 DIAGNOSIS — J309 Allergic rhinitis, unspecified: Secondary | ICD-10-CM | POA: Diagnosis not present

## 2023-06-06 ENCOUNTER — Ambulatory Visit (INDEPENDENT_AMBULATORY_CARE_PROVIDER_SITE_OTHER): Admitting: Allergy and Immunology

## 2023-06-06 ENCOUNTER — Encounter: Payer: Self-pay | Admitting: Allergy and Immunology

## 2023-06-06 VITALS — BP 102/64 | HR 84 | Temp 97.8°F | Resp 16 | Ht 66.25 in | Wt 149.0 lb

## 2023-06-06 DIAGNOSIS — J4540 Moderate persistent asthma, uncomplicated: Secondary | ICD-10-CM | POA: Diagnosis not present

## 2023-06-06 DIAGNOSIS — J3089 Other allergic rhinitis: Secondary | ICD-10-CM | POA: Diagnosis not present

## 2023-06-06 DIAGNOSIS — K9041 Non-celiac gluten sensitivity: Secondary | ICD-10-CM

## 2023-06-06 DIAGNOSIS — T781XXD Other adverse food reactions, not elsewhere classified, subsequent encounter: Secondary | ICD-10-CM

## 2023-06-06 NOTE — Patient Instructions (Addendum)
   1. Continue to treat and prevent inflammation with the following:   A. Immunotherapy  2.  Continue the following if needed:   A.  Xyzal 5 mg - 1 tablet 1 time per day  B.  EpiPen  C.  AIRSUPRA -2 inhalations every 4-6 hours  D.  OTC Nasacort - 1 spray each nostril 1-2 times per day  3. Can introduce small amount of wheat. If symptoms, then obtain blood test (celiac screen) 2 weeks after reaction)  4. Return to clinic in 6 months or earlier if problem  5. Influenza = Tamiflu. Covid = Paxlovid

## 2023-06-06 NOTE — Progress Notes (Unsigned)
 [place] - [place] - [place] - [place] - [place]   Follow-up Note  Referring Provider: Verlon Au, MD Primary Provider: Verlon Au, MD Date of Office Visit: 06/06/2023  Subjective:   Candace Browning (DOB: [birth year]) is a 43 y.o. female who [facility] in re-evaluation of the following:  HPI: Candace Browning returns to this clinic in reevaluation of asthma, allergic rhinitis, food allergy directed against peanut tree nut, LPR.  I last saw her in this clinic 13 November 2022.  She has really done very well with her airway while using immunotherapy as her major controller agent and she rarely requires a short acting bronchodilator and she can exercise without a problem and have cold air exposure without any problem and has not required a systemic steroid or an antibiotic for any kind of airway issue.  She has had no problems with her reflux and does not require any therapy for that issue.  She has eaten peanuts and eaten cashews with no problem.  She remains away from wheat/gluten consumption as this has been a longstanding restriction.  The last time she had exposure to gluten was with barley and a beer 4 years ago and she developed significant diarrhea and cramping of her abdomen.  She has never been diagnosed with celiac disease or had evaluation for celiac disease.  Allergies as of 06/06/2023       Reactions   Apple Juice Anaphylaxis   Ingrezza [valbenazine Tosylate] Swelling   Swelling of face, rash all over, no SOB   Coconut (cocos Nucifera)    Gluten Meal    All breads   Latex    Nsaids Other (See Comments)   Thin basement membrane disease, advised to avoid NSAIDS   Other    PT HAD FOOD ALLERGIES:APPLES, WHEAT, AND all nuts   Peanut-containing Drug Products    Sulfa Antibiotics    Hydrogen Peroxide Itching, Rash   Penicillins Rash   Has patient had a PCN reaction causing immediate rash, facial/tongue/throat  swelling, SOB or lightheadedness with hypotension: yes  Has patient had a PCN reaction causing severe rash involving mucus membranes or skin necrosis: no Has patient had a PCN reaction that required hospitalization: no  Has patient had a PCN reaction occurring within the last 10 years: no If all of the above answers are "NO", then may proceed with Cephalosporin use.   Wheat Rash        Medication List    Airsupra 90-80 MCG/ACT Aero Generic drug: Albuterol-Budesonide Inhale 2 puffs into the lungs as needed (every four to six hours for cough, wheeze, shortness of breath.  Rinse, gargle, and spit after use).   albuterol 108 (90 Base) MCG/ACT inhaler Commonly known as: VENTOLIN HFA Inhale 2 puffs into the lungs every 6 (six) hours as needed.   ALPRAZolam 0.5 MG tablet Commonly known as: XANAX Take 1 tablet (0.5 mg total) by mouth 2 (two) times daily as needed for anxiety.   buPROPion 300 MG 24 hr tablet Commonly known as: WELLBUTRIN XL TAKE 1 TABLET(300 MG) BY MOUTH DAILY   busPIRone 15 MG tablet Commonly known as: BUSPAR 2 po q am, 1 po at bedtime.   EPINEPHrine 0.3 mg/0.3 mL Soaj injection Commonly known as: EPI-PEN Use as directed for life-threatening allergic reaction.   Ferrous Sulfate 90 (18 Fe) MG Tabs Take by mouth.   levocetirizine 5 MG tablet Commonly known as: XYZAL Take 1 tablet once or twice  daily as needed   Nasacort Allergy 24HR 55 MCG/ACT Aero nasal inhaler Generic drug: triamcinolone Place 2 sprays into the nose daily as needed.   valACYclovir 500 MG tablet Commonly known as: VALTREX Take 1 tablet (500 mg total) by mouth 2 (two) times daily as needed (outbreaks).   VITAMIN D2 PO Take by mouth daily.    Past Medical History:  Diagnosis Date   Allergic rhinitis    Anemia    Anxiety    Asthma    Atopic dermatitis    Bipolar 1 disorder (HCC)    Bipolar 1 disorder, mixed, mild (HCC) 03/29/2017   Eczema    Food allergy    Herpes simplex without  mention of complication    HSV2   Renal disorder     Past Surgical History:  Procedure Laterality Date   WISDOM TOOTH EXTRACTION      Review of systems negative except as noted in HPI / PMHx or noted below:  Review of Systems  Constitutional: Negative.   HENT: Negative.    Eyes: Negative.   Respiratory: Negative.    Cardiovascular: Negative.   Gastrointestinal: Negative.   Genitourinary: Negative.   Musculoskeletal: Negative.   Skin: Negative.   Neurological: Negative.   Endo/Heme/Allergies: Negative.   Psychiatric/Behavioral: Negative.       Objective:   Vitals:   06/06/23 0944  BP: 102/64  Pulse: 84  Resp: 16  Temp: 97.8 F (36.6 C)  SpO2: 98%   Height: 5' 6.25" (168.3 cm)  Weight: 149 lb (67.6 kg)   Physical Exam Constitutional:      Appearance: She is not diaphoretic.  HENT:     Head: Normocephalic.     Right Ear: Tympanic membrane, ear canal and external ear normal.     Left Ear: Tympanic membrane, ear canal and external ear normal.     Nose: Nose normal. No mucosal edema or rhinorrhea.     Mouth/Throat:     Pharynx: Uvula midline. No oropharyngeal exudate.  Eyes:     Conjunctiva/sclera: Conjunctivae normal.  Neck:     Thyroid: No thyromegaly.     Trachea: Trachea normal. No tracheal tenderness or tracheal deviation.  Cardiovascular:     Rate and Rhythm: Normal rate and regular rhythm.     Heart sounds: Normal heart sounds, S1 normal and S2 normal. No murmur heard. Pulmonary:     Effort: No respiratory distress.     Breath sounds: Normal breath sounds. No stridor. No wheezing or rales.  Lymphadenopathy:     Head:     Right side of head: No tonsillar adenopathy.     Left side of head: No tonsillar adenopathy.     Cervical: No cervical adenopathy.  Skin:    Findings: No erythema or rash.     Nails: There is no clubbing.  Neurological:     Mental Status: She is alert.     Diagnostics: Spirometry was performed and demonstrated an FEV1 of  2.63 at 97 % of predicted.  Results of blood tests identified no hypersensitivity directed against peanut or tree nuts or wheat.   Assessment and Plan:   1. Asthma, moderate persistent, well-controlled   2. Other allergic rhinitis   3. Gluten intolerance    1. Continue to treat and prevent inflammation with the following:   A. Immunotherapy  2.  Continue the following if needed:   A.  Xyzal 5 mg - 1 tablet 1 time per day  B.  EpiPen  C.  AIRSUPRA -2 inhalations every 4-6 hours  D.  OTC Nasacort - 1 spray each nostril 1-2 times per day  3. Can introduce small amount of wheat. If symptoms, then obtain blood test (celiac screen) 2 weeks after reaction)  4. Return to clinic in 6 months or earlier if problem  5. Influenza = Tamiflu. Covid = Paxlovid  Candace Browning is doing well while utilizing immunotherapy to address her atopic disease and she will continue on this form of treatment and she has a selection of other agents to be utilized should they be required as noted above.  It does appear as though her peanut and tree nut allergy has resolved.  Concerning her wheat/gluten sensitivity, she can retry some wheat and if she has symptoms then we will obtain a blood test about 2 weeks later which should allow time for the generation of antitransglutaminase antibodies.  Laurette Schimke, MD Allergy / Immunology [facility]

## 2023-06-07 ENCOUNTER — Encounter: Payer: Self-pay | Admitting: Allergy and Immunology

## 2023-06-19 ENCOUNTER — Telehealth: Payer: Self-pay | Admitting: Allergy and Immunology

## 2023-06-19 DIAGNOSIS — K9041 Non-celiac gluten sensitivity: Secondary | ICD-10-CM

## 2023-06-19 DIAGNOSIS — Z91018 Allergy to other foods: Secondary | ICD-10-CM

## 2023-06-19 NOTE — Telephone Encounter (Signed)
 Patient states she has been experiencing "bad gas" for the last week after eating anything with wheat. She is wondering if we could order a celiac blood test.

## 2023-06-19 NOTE — Telephone Encounter (Signed)
 Spoke with pt and she notices gas about 20-30 minutes after eating wheat such as cookies and hamburger buns. She also has bloating within an hour of eating wheat. This issue has only been going on for 1 week. Dr. Lucie Leather please advise if you would like to order a celiac panel.

## 2023-06-19 NOTE — Telephone Encounter (Signed)
 Pt informed order was placed and she will plan to go to the [facility] inside of [facility] on [address].

## 2023-06-24 LAB — CELIAC PANEL 10
Antigliadin Abs, IgA: 7 U (ref 0–19)
Endomysial IgA: NEGATIVE
Gliadin IgG: 8 U (ref 0–19)
IgA/Immunoglobulin A, Serum: 159 mg/dL (ref 87–352)
Tissue Transglut Ab: 13 U/mL — ABNORMAL HIGH (ref 0–5)
Transglutaminase IgA: <2 U/mL (ref 0–3)

## 2023-07-03 ENCOUNTER — Ambulatory Visit (INDEPENDENT_AMBULATORY_CARE_PROVIDER_SITE_OTHER): Payer: Medicare HMO | Admitting: Physician Assistant

## 2023-07-03 ENCOUNTER — Ambulatory Visit (INDEPENDENT_AMBULATORY_CARE_PROVIDER_SITE_OTHER): Payer: Self-pay

## 2023-07-03 ENCOUNTER — Encounter: Payer: Self-pay | Admitting: Physician Assistant

## 2023-07-03 DIAGNOSIS — F411 Generalized anxiety disorder: Secondary | ICD-10-CM

## 2023-07-03 DIAGNOSIS — F99 Mental disorder, not otherwise specified: Secondary | ICD-10-CM

## 2023-07-03 DIAGNOSIS — F3160 Bipolar disorder, current episode mixed, unspecified: Secondary | ICD-10-CM

## 2023-07-03 DIAGNOSIS — F5105 Insomnia due to other mental disorder: Secondary | ICD-10-CM

## 2023-07-03 DIAGNOSIS — J309 Allergic rhinitis, unspecified: Secondary | ICD-10-CM | POA: Diagnosis not present

## 2023-07-03 DIAGNOSIS — Z566 Other physical and mental strain related to work: Secondary | ICD-10-CM

## 2023-07-03 MED ORDER — BUSPIRONE HCL 15 MG PO TABS: ORAL_TABLET | ORAL | 1 refills | Status: DC

## 2023-07-03 MED ORDER — RISPERIDONE 0.5 MG PO TABS: ORAL_TABLET | ORAL | 1 refills | Status: DC

## 2023-07-03 NOTE — Progress Notes (Signed)
 [facility]  Patient ID: Candace Browning,  MRN: [id number]  PCP: Jacqulyne Maxim, MD  Date of Evaluation: 07/03/2023 Time spent:30 minutes  Chief Complaint:  Chief Complaint   Anxiety; Depression; Follow-up    HISTORY/CURRENT STATUS: HPI For routine follow up.   For a month has been having short manic spurts and then is depressed. Has occas 'middle baseline mood.'  There has been no known trigger.  These episodes can occur a few times a day.  She has increased spending, associated with increased energy and decreased need for sleep, but then other times she feels tired and does not want to get up and do anything.  She has also had increased talkativeness, racing thoughts that come and go.  She has been more impulsive, especially having bought an expensive outfit without thinking it through.  She plans to return at.  No risky behaviors, did not ask about libido.  No grandiosity, hallucinations, or paranoia.  When she gets depressed she lacks energy and motivation.  She is not missing work however.  She still works part-time at [employer].  She does not cry easily.  ADLs and personal hygiene are normal.  Appetite is normal and weight is stable.  No feelings of hopelessness.  She has been more anxious lately and needs the Xanax.  It is effective.  No suicidal or homicidal thoughts.  Denies dizziness, syncope, seizures, numbness, tingling, tremor, tics, unsteady gait, slurred speech, confusion. Denies muscle or joint pain, stiffness, or dystonia. Denies unexplained weight loss, frequent infections, or sores that heal slowly.  No polyphagia, polydipsia, or polyuria. Denies visual changes or paresthesias.   Individual Medical History/ Review of Systems: Changes? :No     Past medications for mental health diagnoses include: BuSpar, Xanax, Latuda caused severe fatigue, Depakote did not work at all,  Vistaril, Lamictal caused SI, Valium, Seroquel, Abilify, Risperdal, lithium,  Equetro, Geodon, prazosin, doxazosin, Luvox, Zoloft, Vraylar has TD at higher doses. Wellbutrin, Rexulti, Zyprexa caused confusion and stuttering, Gabapentin unsure if it helped or not. Artane no help, Trileptal at doses higher than 300 mg bid caused severe vomiting and vertigo. Austedo at high doses caused extreme brain fog but helped the cough but even at low doses caused tremor in legs.   INGREZZA CAUSED SWELLING OF FACE AND RASH ALL OVER, NO SOB.  Allergies: Apple juice, Ingrezza [valbenazine tosylate], Coconut (cocos nucifera), Gluten meal, Latex, Nsaids, Other, Peanut-containing drug products, Sulfa antibiotics, Hydrogen peroxide, Penicillins, and Wheat  Current Medications:  Current Outpatient Medications:    ALPRAZolam (XANAX) 0.5 MG tablet, Take 1 tablet (0.5 mg total) by mouth 2 (two) times daily as needed for anxiety., Disp: 60 tablet, Rfl: 5   buPROPion (WELLBUTRIN XL) 300 MG 24 hr tablet, TAKE 1 TABLET(300 MG) BY MOUTH DAILY, Disp: 90 tablet, Rfl: 3   Ergocalciferol (VITAMIN D2 PO), Take by mouth daily., Disp: , Rfl:    Ferrous Sulfate 90 (18 Fe) MG TABS, Take by mouth., Disp: , Rfl:    levocetirizine (XYZAL) 5 MG tablet, Take 1 tablet once or twice daily as needed, Disp: 60 tablet, Rfl: 5   risperiDONE (RISPERDAL) 0.5 MG tablet, 1 po at bedtime for 1 week, then increase to 2 po at bedtime., Disp: 60 tablet, Rfl: 1   triamcinolone (NASACORT ALLERGY 24HR) 55 MCG/ACT AERO nasal inhaler, Place 2 sprays into the nose daily as needed., Disp: , Rfl:    VRAYLAR 3 MG capsule, Take 3 mg by mouth daily., Disp: , Rfl:  albuterol (VENTOLIN HFA) 108 (90 Base) MCG/ACT inhaler, Inhale 2 puffs into the lungs every 6 (six) hours as needed. (Patient not taking: Reported on 04/10/2023), Disp: 18 g, Rfl: 1   Albuterol-Budesonide (AIRSUPRA) 90-80 MCG/ACT AERO, Inhale 2 puffs into the lungs as needed (every four to six hours for cough, wheeze, shortness of breath.  Rinse, gargle, and spit after use).  (Patient not taking: Reported on 04/10/2023), Disp: 10.7 g, Rfl: 1   busPIRone (BUSPAR) 15 MG tablet, 2 po q am, 1 po at bedtime., Disp: 270 tablet, Rfl: 1   EPINEPHrine 0.3 mg/0.3 mL IJ SOAJ injection, Use as directed for life-threatening allergic reaction. (Patient not taking: Reported on 07/03/2023), Disp: 2 each, Rfl: 3   valACYclovir (VALTREX) 500 MG tablet, Take 1 tablet (500 mg total) by mouth 2 (two) times daily as needed (outbreaks). (Patient not taking: Reported on 07/03/2023), Disp: 30 tablet, Rfl: 0 Medication Side Effects: none  Family Medical/ Social History: Changes? None  MENTAL HEALTH EXAM:  There were no vitals taken for this visit.There is no height or weight on file to calculate BMI.  General Appearance: Casual and Well Groomed  Eye Contact:  Good  Speech:  Clear and Coherent and Normal Rate  Volume:  Normal  Mood:  Euphoric  Affect:  Congruent  Thought Process:  Goal Directed and Descriptions of Associations: Circumstantial  Orientation:  Full (Time, Place, and Person)  Thought Content: Logical   Suicidal Thoughts:  No  Homicidal Thoughts:  No  Memory:  WNL  Judgement:  Good  Insight:  Good  Psychomotor Activity:  Normal  Concentration:  Concentration: Good and Attention Span: Good  Recall:  Good  Fund of Knowledge: Good  Language: Good  Assets:  Communication Skills Desire for Improvement Financial Resources/Insurance Housing Resilience Transportation Vocational/Educational  ADL's:  Intact  Cognition: WNL  Prognosis:  Good   PCP follows labs  DIAGNOSES:    ICD-10-CM   1. Bipolar I disorder, most recent episode mixed (HCC)  F31.60     2. Generalized anxiety disorder  F41.1     3. Insomnia due to other mental disorder  F51.05    F99     4. Work stress  Z56.6      Receiving Psychotherapy: Yes   with Emogene Harpin.  RECOMMENDATIONS:  PDMP was reviewed.  Last Xanax filled 05/10/2023. I provided 30 minutes of face to face time during this  encounter, including time spent before and after the visit in records review, medical decision making, counseling pertinent to today's visit, and charting.   We discussed the mixed bipolar symptoms.  I initially thought about adding lithium.  However she reminded me that she saw a nephrologist several years back for hematuria.  From [facility] nephrology note 05/31/2015  **THIN BASEMENT MEMBRANE DISEASE: Pt with microscopic hematuria and a negative serologic workup. She underwent biopsy 03/02/2016 which showed thin basement membrane disease and focal mild tubulointerstitial scarring. Staining for mutations in collagen Type IV negative. I do not suspect that Ms. Mcduffie will ever need dialysis, but in the setting the mild scarring on biopsy, our plan is as follows to minimize development of significant CKD: - avoiding nephrotoxic agents such as NSAIDs, IV contrast, and fleets enemas - discussed the importance of long-term health including guarding against developing HTN or DM - also discussed avoiding PPIs as able because of potential to cause AIN.  - plenty of hydration - no proteinuria; anti-proteinuric therapy not warranted  Due to this I will not  prescribe lithium.  We have tried a higher dose of Vraylar in the past.  That caused tardive dyskinesia.  She has tried Zyprexa and multiple other antipsychotics and mood stabilizers with various side effects.  She is not sure about Risperdal whether it was effective or not or caused any intolerable side effects.  I recommend adding a low dose of that into her medication regimen.  Benefits, risks and side effects were discussed and she accepts.  Continue Xanax 0.5 mg, 1 p.o. twice daily as needed.  (Ok to take 2 po qhs prn since 1 isn't helping her sleep right now.) Continue Wellbutrin XL 300 mg, 1 p.o. every morning.   Continue BuSpar 15 mg to 1 p.o. 3 times daily. Start Risperdal 0.5 mg, 1 p.o. nightly for 1 week and then increase to 2 p.o. nightly.  If she  notices benefit after the 1 week then she can stay at 0.5 mg. Continue Vraylar 3 mg, 1 p.o. daily.  Continue therapy with Emogene Harpin. Return in 6 to 8 weeks.  Marvia Slocumb, PA-C

## 2023-08-01 ENCOUNTER — Ambulatory Visit (INDEPENDENT_AMBULATORY_CARE_PROVIDER_SITE_OTHER)

## 2023-08-01 DIAGNOSIS — J309 Allergic rhinitis, unspecified: Secondary | ICD-10-CM

## 2023-08-29 ENCOUNTER — Ambulatory Visit (INDEPENDENT_AMBULATORY_CARE_PROVIDER_SITE_OTHER): Admitting: Physician Assistant

## 2023-08-29 ENCOUNTER — Encounter: Payer: Self-pay | Admitting: Physician Assistant

## 2023-08-29 DIAGNOSIS — F411 Generalized anxiety disorder: Secondary | ICD-10-CM

## 2023-08-29 DIAGNOSIS — Z566 Other physical and mental strain related to work: Secondary | ICD-10-CM

## 2023-08-29 DIAGNOSIS — F319 Bipolar disorder, unspecified: Secondary | ICD-10-CM | POA: Diagnosis not present

## 2023-08-29 DIAGNOSIS — F5105 Insomnia due to other mental disorder: Secondary | ICD-10-CM

## 2023-08-29 DIAGNOSIS — F99 Mental disorder, not otherwise specified: Secondary | ICD-10-CM

## 2023-08-29 MED ORDER — VRAYLAR 3 MG PO CAPS: 3.0000 mg | ORAL_CAPSULE | Freq: Every day | ORAL | 11 refills | Status: DC

## 2023-08-29 MED ORDER — RISPERIDONE 2 MG PO TABS: 2.0000 mg | ORAL_TABLET | Freq: Every day | ORAL | 1 refills | Status: DC

## 2023-08-29 NOTE — Progress Notes (Signed)
 [facility]  Patient ID: Candace Browning,  MRN: [id number]  PCP: Jacqulyne Maxim, MD  Date of Evaluation: 08/29/2023 Time spent:20 minutes  Chief Complaint:  Chief Complaint   Follow-up    HISTORY/CURRENT STATUS: HPI For routine follow up.   She had to decrease the Risperdal  back to 0.5 mg for 3-4 days b/c the pharmacy didn't have it in stock.  She didn't want to completely go off it.  Still depressed.  Crying easily.  Not missing work but it's sometimes a hard to make herself go.  Or even get out of bed. Motivation is low.  She bought a new camera and is excited about that.  She has a degree in photography  No extreme sadness or feelings of hopelessness.  Hard to go to sleep sometimes.  It's more of an issue of waking up and not being able to go back to sleep for 15 minutes or so.  ADLs and personal hygiene are normal.   Denies any changes in concentration, making decisions, or remembering things.  Appetite has not changed.  She has gained approximately 10 pounds since our last visit, however she was eating a lot of gluten containing foods and preparation for an EGD with biopsy to rule out celiac disease.  She is not sure if the weight is due to that versus the Risperdal .  She is still experiencing anxiety, more generalized, not panic attacks.  Xanax  is still effective.   Denies suicidal or homicidal thoughts.  Patient denies increased energy with decreased need for sleep, increased talkativeness, racing thoughts, impulsivity or risky behaviors, increased spending, increased libido, grandiosity, increased irritability or anger, paranoia, or hallucinations.  Denies dizziness, syncope, seizures, numbness, tingling, tremor, tics, unsteady gait, slurred speech, confusion. Denies muscle or joint pain, stiffness, or dystonia.  Individual Medical History/ Review of Systems: Changes? :Yes see HPI  Past medications for mental health diagnoses include: BuSpar , Xanax , Valium,  Vistaril , Gabapentin unsure if it helped or not  Luvox , Zoloft , Wellbutrin   Depakote did not work at all, Lamictal caused SI, Equetro , Trileptal  at doses higher than 300 mg bid caused severe vomiting and vertigo.  Latuda  caused severe fatigue, Seroquel, Abilify, lithium ,  Geodon ,  Vraylar  has TD at higher doses.  Rexulti , Zyprexa  caused confusion and stuttering,   Artane  no help,  Austedo  at high doses caused extreme brain fog but helped the cough but even at low doses caused tremor in legs.  INGREZZA  CAUSED SWELLING OF FACE AND RASH ALL OVER, NO SOB.  prazosin,  doxazosin,  Allergies: Apple juice, Ingrezza  [valbenazine  tosylate], Coconut (cocos nucifera), Gluten meal, Latex, Nsaids, Other, Peanut-containing drug products, Sulfa antibiotics, Hydrogen peroxide, Penicillins, and Wheat  Current Medications:  Current Outpatient Medications:    ALPRAZolam  (XANAX ) 0.5 MG tablet, Take 1 tablet (0.5 mg total) by mouth 2 (two) times daily as needed for anxiety., Disp: 60 tablet, Rfl: 5   buPROPion  (WELLBUTRIN  XL) 300 MG 24 hr tablet, TAKE 1 TABLET(300 MG) BY MOUTH DAILY, Disp: 90 tablet, Rfl: 3   busPIRone  (BUSPAR ) 15 MG tablet, 2 po q am, 1 po at bedtime., Disp: 270 tablet, Rfl: 1   Ergocalciferol  (VITAMIN D2 PO), Take by mouth daily., Disp: , Rfl:    Ferrous Sulfate 90 (18 Fe) MG TABS, Take by mouth., Disp: , Rfl:    levocetirizine (XYZAL ) 5 MG tablet, Take 1 tablet once or twice daily as needed, Disp: 60 tablet, Rfl: 5   risperiDONE  (RISPERDAL ) 2 MG tablet, Take 1 tablet (2  mg total) by mouth at bedtime., Disp: 30 tablet, Rfl: 1   valACYclovir  (VALTREX ) 500 MG tablet, Take 1 tablet (500 mg total) by mouth 2 (two) times daily as needed (outbreaks)., Disp: 30 tablet, Rfl: 0   albuterol  (VENTOLIN  HFA) 108 (90 Base) MCG/ACT inhaler, Inhale 2 puffs into the lungs every 6 (six) hours as needed. (Patient not taking: Reported on 08/29/2023), Disp: 18 g, Rfl: 1   Albuterol -Budesonide  (AIRSUPRA ) 90-80  MCG/ACT AERO, Inhale 2 puffs into the lungs as needed (every four to six hours for cough, wheeze, shortness of breath.  Rinse, gargle, and spit after use). (Patient not taking: Reported on 08/29/2023), Disp: 10.7 g, Rfl: 1   EPINEPHrine  0.3 mg/0.3 mL IJ SOAJ injection, Use as directed for life-threatening allergic reaction. (Patient not taking: Reported on 08/29/2023), Disp: 2 each, Rfl: 3   triamcinolone  (NASACORT  ALLERGY  24HR) 55 MCG/ACT AERO nasal inhaler, Place 2 sprays into the nose daily as needed. (Patient not taking: Reported on 08/29/2023), Disp: , Rfl:    VRAYLAR  3 MG capsule, Take 1 capsule (3 mg total) by mouth daily., Disp: 30 capsule, Rfl: 11 Medication Side Effects: none  Family Medical/ Social History: Changes? None  MENTAL HEALTH EXAM:  There were no vitals taken for this visit.There is no height or weight on file to calculate BMI.  General Appearance: Casual and Well Groomed  Eye Contact:  Good  Speech:  Clear and Coherent and Normal Rate  Volume:  Normal  Mood:  sad  Affect:  Congruent  Thought Process:  Goal Directed and Descriptions of Associations: Circumstantial  Orientation:  Full (Time, Place, and Person)  Thought Content: Logical   Suicidal Thoughts:  No  Homicidal Thoughts:  No  Memory:  WNL  Judgement:  Good  Insight:  Good  Psychomotor Activity:  Normal  Concentration:  Concentration: Good and Attention Span: Good  Recall:  Good  Fund of Knowledge: Good  Language: Good  Assets:  Communication Skills Desire for Improvement Financial Resources/Insurance Housing Resilience Transportation Vocational/Educational  ADL's:  Intact  Cognition: WNL  Prognosis:  Good   PCP follows labs  DIAGNOSES:    ICD-10-CM   1. Bipolar depression (HCC)  F31.9     2. Generalized anxiety disorder  F41.1     3. Work stress  Z56.6     4. Insomnia due to other mental disorder  F51.05    F99       Receiving Psychotherapy: Yes   with Emogene Harpin.  RECOMMENDATIONS:  PDMP was reviewed.  Last Xanax  filled 07/27/2023.   I provided 20 minutes of face to face time during this encounter, including time spent before and after the visit in records review, medical decision making, counseling pertinent to today's visit, and charting.   We discussed the depression.  I recommend increasing Risperdal .  If in 3 to 4 weeks she is not having any improvement in depression, call and I will increase the dose further.  She understands.  Sleep hygiene briefly discussed.  We will watch her weight, try to avoid high carb foods.  Continue Xanax  0.5 mg, 1 p.o. twice daily as needed.  (Ok to take 2 po qhs prn since 1 isn't helping her sleep right now.) Continue Wellbutrin  XL 300 mg, 1 p.o. every morning.   Continue BuSpar  15 mg to 1 p.o. 3 times daily. Increase Risperdal  to 2 mg nightly.   Continue Vraylar  3 mg, 1 p.o. daily.  Continue therapy with Emogene Harpin. Return in 6 to  8 weeks.  Marvia Slocumb, PA-C

## 2023-09-10 ENCOUNTER — Ambulatory Visit (INDEPENDENT_AMBULATORY_CARE_PROVIDER_SITE_OTHER): Payer: Self-pay | Admitting: *Deleted

## 2023-09-10 DIAGNOSIS — J309 Allergic rhinitis, unspecified: Secondary | ICD-10-CM

## 2023-10-10 ENCOUNTER — Encounter: Payer: Self-pay | Admitting: Physician Assistant

## 2023-10-10 ENCOUNTER — Ambulatory Visit (INDEPENDENT_AMBULATORY_CARE_PROVIDER_SITE_OTHER): Payer: Self-pay

## 2023-10-10 ENCOUNTER — Ambulatory Visit (INDEPENDENT_AMBULATORY_CARE_PROVIDER_SITE_OTHER): Admitting: Physician Assistant

## 2023-10-10 DIAGNOSIS — F4321 Adjustment disorder with depressed mood: Secondary | ICD-10-CM | POA: Diagnosis not present

## 2023-10-10 DIAGNOSIS — F411 Generalized anxiety disorder: Secondary | ICD-10-CM

## 2023-10-10 DIAGNOSIS — G4700 Insomnia, unspecified: Secondary | ICD-10-CM | POA: Diagnosis not present

## 2023-10-10 DIAGNOSIS — J309 Allergic rhinitis, unspecified: Secondary | ICD-10-CM | POA: Diagnosis not present

## 2023-10-10 DIAGNOSIS — F319 Bipolar disorder, unspecified: Secondary | ICD-10-CM

## 2023-10-10 MED ORDER — BUSPIRONE HCL 30 MG PO TABS: 30.0000 mg | ORAL_TABLET | Freq: Two times a day (BID) | ORAL | 1 refills | Status: DC

## 2023-10-10 MED ORDER — RISPERIDONE 1 MG PO TABS: 1.0000 mg | ORAL_TABLET | Freq: Every day | ORAL | 1 refills | Status: DC

## 2023-10-10 MED ORDER — ALPRAZOLAM 0.5 MG PO TABS
0.5000 mg | ORAL_TABLET | Freq: Two times a day (BID) | ORAL | 5 refills | Status: DC | PRN
Start: 2023-10-10 — End: 2023-11-20

## 2023-10-10 NOTE — Progress Notes (Unsigned)
 [facility]  Patient ID: Candace Browning,  MRN: [id number]  PCP: Jolee Madelin Patch, MD  Date of Evaluation: 10/10/2023 Time spent:25 minutes  Chief Complaint:  Chief Complaint   Follow-up    HISTORY/CURRENT STATUS: HPI For routine follow up.   We increased the Risperdal  about 6 weeks ago. She has intrusive thoughts, really bad, started w/ the increased Risperdal . She'll worry about her husband driving home from work, obsesses over his safety, until he gets home, worries about the safety of her brother, Mom, and son. She checks her son's location all the time but that's the only compulsion.  No PA but feels very anxious just about all the time.   It's hard to say how she feels as far as mood goes b/c her BIL was killed a few days ago in a motorcycle accident.  She's sad, but wasn't feeling depressed or manic before the accident. Since increasing the Risperdal , she has noticed increased energy and motivation. So that part is good.  Work is the same, [employer].   Sleeps well ok. . ADLs and personal hygiene are normal.   Denies any changes in concentration, making decisions, or remembering things.  Appetite has not changed.  Weight is stable.   No mania, delirium, AH/VH.  No SI/HI.  Denies dizziness, syncope, seizures, numbness, tingling, tremor, tics, unsteady gait, slurred speech, confusion. Denies muscle or joint pain, stiffness, or dystonia.Denies unexplained weight loss, frequent infections, or sores that heal slowly.  No polyphagia, polydipsia, or polyuria. Denies visual changes or paresthesias.   Individual Medical History/ Review of Systems: Changes? :No   Past medications for mental health diagnoses include: BuSpar , Xanax , Valium, Vistaril , Gabapentin unsure if it helped or not  Luvox , Zoloft , Wellbutrin   Depakote did not work at all, Lamictal caused SI, Equetro , Trileptal  at doses higher than 300 mg bid caused severe vomiting and vertigo.  Latuda  caused severe  fatigue, Seroquel, Abilify, lithium ,  Geodon ,  Vraylar  has TD at higher doses.  Rexulti , Zyprexa  caused confusion and stuttering,   Artane  no help,  Austedo  at high doses caused extreme brain fog but helped the cough but even at low doses caused tremor in legs.  INGREZZA  CAUSED SWELLING OF FACE AND RASH ALL OVER, NO SOB.  prazosin,  doxazosin,  Allergies: Apple juice, Ingrezza  [valbenazine  tosylate], Coconut (cocos nucifera), Gluten meal, Latex, Nsaids, Other, Peanut-containing drug products, Sulfa antibiotics, Hydrogen peroxide, Penicillins, and Wheat  Current Medications:  Current Outpatient Medications:    buPROPion  (WELLBUTRIN  XL) 300 MG 24 hr tablet, TAKE 1 TABLET(300 MG) BY MOUTH DAILY, Disp: 90 tablet, Rfl: 3   busPIRone  (BUSPAR ) 30 MG tablet, Take 1 tablet (30 mg total) by mouth 2 (two) times daily., Disp: 180 tablet, Rfl: 1   Ergocalciferol  (VITAMIN D2 PO), Take by mouth daily., Disp: , Rfl:    Ferrous Sulfate 90 (18 Fe) MG TABS, Take by mouth., Disp: , Rfl:    levocetirizine (XYZAL ) 5 MG tablet, Take 1 tablet once or twice daily as needed, Disp: 60 tablet, Rfl: 5   risperiDONE  (RISPERDAL ) 1 MG tablet, Take 1 tablet (1 mg total) by mouth at bedtime., Disp: 90 tablet, Rfl: 1   valACYclovir  (VALTREX ) 500 MG tablet, Take 1 tablet (500 mg total) by mouth 2 (two) times daily as needed (outbreaks)., Disp: 30 tablet, Rfl: 0   VRAYLAR  3 MG capsule, Take 1 capsule (3 mg total) by mouth daily., Disp: 30 capsule, Rfl: 11   albuterol  (VENTOLIN  HFA) 108 (90 Base) MCG/ACT inhaler,  Inhale 2 puffs into the lungs every 6 (six) hours as needed. (Patient not taking: Reported on 08/29/2023), Disp: 18 g, Rfl: 1   Albuterol -Budesonide  (AIRSUPRA ) 90-80 MCG/ACT AERO, Inhale 2 puffs into the lungs as needed (every four to six hours for cough, wheeze, shortness of breath.  Rinse, gargle, and spit after use). (Patient not taking: Reported on 08/29/2023), Disp: 10.7 g, Rfl: 1   ALPRAZolam  (XANAX ) 0.5 MG tablet, Take  1 tablet (0.5 mg total) by mouth 2 (two) times daily as needed for anxiety., Disp: 60 tablet, Rfl: 5   EPINEPHrine  0.3 mg/0.3 mL IJ SOAJ injection, Use as directed for life-threatening allergic reaction. (Patient not taking: Reported on 08/29/2023), Disp: 2 each, Rfl: 3   triamcinolone  (NASACORT  ALLERGY  24HR) 55 MCG/ACT AERO nasal inhaler, Place 2 sprays into the nose daily as needed. (Patient not taking: Reported on 08/29/2023), Disp: , Rfl:  Medication Side Effects: none  Family Medical/ Social History: Changes? Her brother-in-law was killed in a motorcycle accident this past weekend. He was killed instantly, on 3 weeks after he was killed. He might have been driving drunk, even though he was hit from behind.  Autopsy pending.   MENTAL HEALTH EXAM:  There were no vitals taken for this visit.There is no height or weight on file to calculate BMI.  General Appearance: Casual and Well Groomed  Eye Contact:  Good  Speech:  Clear and Coherent and Normal Rate  Volume:  Normal  Mood:  sad  Affect:  Congruent  Thought Process:  Goal Directed and Descriptions of Associations: Circumstantial  Orientation:  Full (Time, Place, and Person)  Thought Content: Logical   Suicidal Thoughts:  No  Homicidal Thoughts:  No  Memory:  WNL  Judgement:  Good  Insight:  Good  Psychomotor Activity:  Normal  Concentration:  Concentration: Good and Attention Span: Good  Recall:  Good  Fund of Knowledge: Good  Language: Good  Assets:  Communication Skills Desire for Improvement Financial Resources/Insurance Housing Physical Health Resilience Transportation Vocational/Educational  ADL's:  Intact  Cognition: WNL  Prognosis:  Good   PCP follows labs  DIAGNOSES:    ICD-10-CM   1. Generalized anxiety disorder  F41.1     2. Bipolar I disorder (HCC)  F31.9     3. Insomnia, unspecified type  G47.00     4. Grief  F43.21       Receiving Psychotherapy: Yes   with Randine Maxin.  RECOMMENDATIONS:   PDMP was reviewed.  Last Xanax  filled 07/27/2023.   I provided approximately 25 minutes of face to face time during this encounter, including time spent before and after the visit in records review, medical decision making, counseling pertinent to today's visit, and charting.   My condolence in the loss of her brother-in-law.  We discussed the anxiety and obsessions about the safety of her family members.  I recommend increasing the BuSpar  dose.  Also encouraged Xanax  use, she tries not to take it but I think at this point it is really needed.  Also discussed decreasing the Risperdal .  It has helped with energy and motivation but seems to have made the anxiety worse.  She understands and agrees with this plan.  Continue Xanax  0.5 mg, 1 p.o. twice daily as needed.   Continue Wellbutrin  XL 300 mg, 1 p.o. every morning.   Increase BuSpar  to 30 mg, 1 p.o. twice daily. Decrease Risperdal  back to 1 mg, at bedtime.  Continue Vraylar  3 mg, 1 p.o. daily.  Continue  therapy with Randine Maxin. Return in 6 to 8 weeks.  Verneita Cooks, PA-C

## 2023-10-15 ENCOUNTER — Other Ambulatory Visit: Payer: Self-pay | Admitting: Physician Assistant

## 2023-11-03 ENCOUNTER — Other Ambulatory Visit: Payer: Self-pay | Admitting: Physician Assistant

## 2023-11-13 ENCOUNTER — Ambulatory Visit (INDEPENDENT_AMBULATORY_CARE_PROVIDER_SITE_OTHER): Payer: Self-pay

## 2023-11-13 DIAGNOSIS — J309 Allergic rhinitis, unspecified: Secondary | ICD-10-CM

## 2023-11-20 ENCOUNTER — Encounter: Payer: Self-pay | Admitting: Physician Assistant

## 2023-11-20 ENCOUNTER — Ambulatory Visit (INDEPENDENT_AMBULATORY_CARE_PROVIDER_SITE_OTHER): Admitting: Physician Assistant

## 2023-11-20 DIAGNOSIS — F411 Generalized anxiety disorder: Secondary | ICD-10-CM | POA: Diagnosis not present

## 2023-11-20 DIAGNOSIS — F5105 Insomnia due to other mental disorder: Secondary | ICD-10-CM

## 2023-11-20 DIAGNOSIS — F319 Bipolar disorder, unspecified: Secondary | ICD-10-CM

## 2023-11-20 DIAGNOSIS — F99 Mental disorder, not otherwise specified: Secondary | ICD-10-CM

## 2023-11-20 DIAGNOSIS — G4700 Insomnia, unspecified: Secondary | ICD-10-CM | POA: Diagnosis not present

## 2023-11-20 DIAGNOSIS — Z566 Other physical and mental strain related to work: Secondary | ICD-10-CM | POA: Diagnosis not present

## 2023-11-20 MED ORDER — ALPRAZOLAM 0.25 MG PO TABS: 0.2500 mg | ORAL_TABLET | Freq: Three times a day (TID) | ORAL | 1 refills | Status: AC | PRN

## 2023-11-20 MED ORDER — BUPROPION HCL ER (XL) 300 MG PO TB24: ORAL_TABLET | ORAL | 3 refills | Status: AC

## 2023-11-20 NOTE — Progress Notes (Signed)
 [facility]  Patient ID: Candace Browning,  MRN: [id number]  PCP: Jolee Madelin Patch, MD  Date of Evaluation: 11/20/2023 Time spent:20 minutes  Chief Complaint:  Chief Complaint   Follow-up    HISTORY/CURRENT STATUS: HPI For 6 week med check.   Has been more anxious at work but feels like it is normal under the circumstances.  She feels like the recent medication changes have been helpful.  The anxiety is controlled.  She does take Xanax  when needed and it is effective.  It does make her too sleepy sometimes so she will not take it while at work, when it is needed the most.  The BuSpar  has helped to prevent it from being too severe.  Patient is able to enjoy things.  Energy and motivation are good.  No extreme sadness, tearfulness, or feelings of hopelessness.  Sleeps well most of the time. ADLs and personal hygiene are normal.   Denies any changes in concentration, making decisions, or remembering things.  Appetite has not changed.  Weight is stable.   No SI/HI.  No reports of increased energy with decreased need for sleep, increased talkativeness, racing thoughts, impulsivity or risky behaviors, increased spending, increased libido, grandiosity, increased irritability or anger, paranoia, or hallucinations.  Individual Medical History/ Review of Systems: Changes? :No   Past medications for mental health diagnoses include: BuSpar , Xanax , Valium, Vistaril , Gabapentin unsure if it helped or not  Luvox , Zoloft , Wellbutrin   Depakote did not work at all, Lamictal caused SI, Equetro , Trileptal  at doses higher than 300 mg bid caused severe vomiting and vertigo.  Latuda  caused severe fatigue, Seroquel, Abilify, lithium ,  Geodon ,  Vraylar  has TD at higher doses.  Rexulti , Zyprexa  caused confusion and stuttering,   Artane  no help,  Austedo  at high doses caused extreme brain fog but helped the cough but even at low doses caused tremor in legs.  INGREZZA  CAUSED SWELLING OF FACE  AND RASH ALL OVER, NO SOB.  prazosin,  doxazosin,  Allergies: Apple juice, Ingrezza  [valbenazine  tosylate], Coconut (cocos nucifera), Gluten meal, Latex, Nsaids, Other, Peanut-containing drug products, Sulfa antibiotics, Hydrogen peroxide, Penicillins, and Wheat  Current Medications:  Current Outpatient Medications:    ALPRAZolam  (XANAX ) 0.25 MG tablet, Take 1-2 tablets (0.25-0.5 mg total) by mouth 3 (three) times daily as needed for anxiety., Disp: 90 tablet, Rfl: 1   busPIRone  (BUSPAR ) 30 MG tablet, Take 1 tablet (30 mg total) by mouth 2 (two) times daily., Disp: 180 tablet, Rfl: 1   cariprazine  (VRAYLAR ) 3 MG capsule, TAKE 1 CAPSULE(3 MG) BY MOUTH DAILY, Disp: 30 capsule, Rfl: 1   EPINEPHrine  0.3 mg/0.3 mL IJ SOAJ injection, Use as directed for life-threatening allergic reaction., Disp: 2 each, Rfl: 3   Ergocalciferol  (VITAMIN D2 PO), Take by mouth daily., Disp: , Rfl:    Ferrous Sulfate 90 (18 Fe) MG TABS, Take by mouth., Disp: , Rfl:    levocetirizine (XYZAL ) 5 MG tablet, Take 1 tablet once or twice daily as needed, Disp: 60 tablet, Rfl: 5   risperiDONE  (RISPERDAL ) 1 MG tablet, Take 1 tablet (1 mg total) by mouth at bedtime., Disp: 90 tablet, Rfl: 1   triamcinolone  (NASACORT  ALLERGY  24HR) 55 MCG/ACT AERO nasal inhaler, Place 2 sprays into the nose daily as needed., Disp: , Rfl:    valACYclovir  (VALTREX ) 500 MG tablet, Take 1 tablet (500 mg total) by mouth 2 (two) times daily as needed (outbreaks)., Disp: 30 tablet, Rfl: 0   albuterol  (VENTOLIN  HFA) 108 (90 Base) MCG/ACT inhaler,  Inhale 2 puffs into the lungs every 6 (six) hours as needed. (Patient not taking: Reported on 11/20/2023), Disp: 18 g, Rfl: 1   Albuterol -Budesonide  (AIRSUPRA ) 90-80 MCG/ACT AERO, Inhale 2 puffs into the lungs as needed (every four to six hours for cough, wheeze, shortness of breath.  Rinse, gargle, and spit after use). (Patient not taking: Reported on 11/20/2023), Disp: 10.7 g, Rfl: 1   buPROPion  (WELLBUTRIN  XL) 300 MG  24 hr tablet, TAKE 1 TABLET(300 MG) BY MOUTH DAILY, Disp: 90 tablet, Rfl: 3 Medication Side Effects: none  Family Medical/ Social History: Changes? No  MENTAL HEALTH EXAM:  There were no vitals taken for this visit.There is no height or weight on file to calculate BMI.  General Appearance: Casual and Well Groomed  Eye Contact:  Good  Speech:  Clear and Coherent and Normal Rate  Volume:  Normal  Mood:  Euthymic  Affect:  Congruent  Thought Process:  Goal Directed and Descriptions of Associations: Circumstantial  Orientation:  Full (Time, Place, and Person)  Thought Content: Logical   Suicidal Thoughts:  No  Homicidal Thoughts:  No  Memory:  WNL  Judgement:  Good  Insight:  Good  Psychomotor Activity:  Normal  Concentration:  Concentration: Good and Attention Span: Good  Recall:  Good  Fund of Knowledge: Good  Language: Good  Assets:  Communication Skills Desire for Improvement Financial Resources/Insurance Housing Physical Health Resilience Transportation Vocational/Educational  ADL's:  Intact  Cognition: WNL  Prognosis:  Good   PCP follows labs  DIAGNOSES:    ICD-10-CM   1. Bipolar I disorder (HCC)  F31.9     2. Generalized anxiety disorder  F41.1     3. Insomnia, unspecified type  G47.00     4. Work stress  Z56.6     5. Insomnia due to other mental disorder  F51.05    F99       Receiving Psychotherapy: Yes   with Randine Maxin.  RECOMMENDATIONS:  PDMP was reviewed.  Last Xanax  filled 10/10/2023. I provided approximately 20 minutes of face to face time during this encounter, including time spent before and after the visit in records review, medical decision making, counseling pertinent to today's visit, and charting.   She is doing well as far as her medications go.  I will decrease the dose of Xanax  due to excessive sleepiness during the day if she takes 1.    Decrease Xanax  to 0.25 mg, 1-2 3 times daily as needed anxiety. Continue Wellbutrin  XL 300  mg, 1 p.o. every morning.   Continue BuSpar  30 mg, 1 p.o. twice daily. Continue Risperdal  1 mg, at bedtime.  Continue Vraylar  3 mg, 1 p.o. daily.  Continue therapy with Randine Maxin. Return in 3 months.  Verneita Cooks, PA-C

## 2023-12-13 ENCOUNTER — Ambulatory Visit (INDEPENDENT_AMBULATORY_CARE_PROVIDER_SITE_OTHER)

## 2023-12-13 DIAGNOSIS — J309 Allergic rhinitis, unspecified: Secondary | ICD-10-CM | POA: Diagnosis not present

## 2024-01-02 DIAGNOSIS — J301 Allergic rhinitis due to pollen: Secondary | ICD-10-CM | POA: Diagnosis not present

## 2024-01-02 NOTE — Progress Notes (Signed)
 VIALS MADE ON 01/02/24

## 2024-01-03 DIAGNOSIS — J3089 Other allergic rhinitis: Secondary | ICD-10-CM | POA: Diagnosis not present

## 2024-01-03 DIAGNOSIS — J3081 Allergic rhinitis due to animal (cat) (dog) hair and dander: Secondary | ICD-10-CM | POA: Diagnosis not present

## 2024-01-04 DIAGNOSIS — J302 Other seasonal allergic rhinitis: Secondary | ICD-10-CM | POA: Diagnosis not present

## 2024-01-14 ENCOUNTER — Ambulatory Visit (INDEPENDENT_AMBULATORY_CARE_PROVIDER_SITE_OTHER): Payer: Self-pay | Admitting: *Deleted

## 2024-01-14 DIAGNOSIS — J309 Allergic rhinitis, unspecified: Secondary | ICD-10-CM | POA: Diagnosis not present

## 2024-02-18 ENCOUNTER — Ambulatory Visit: Admitting: *Deleted

## 2024-02-18 DIAGNOSIS — J309 Allergic rhinitis, unspecified: Secondary | ICD-10-CM | POA: Diagnosis not present

## 2024-02-18 DIAGNOSIS — J3089 Other allergic rhinitis: Secondary | ICD-10-CM

## 2024-02-20 ENCOUNTER — Ambulatory Visit: Admitting: Physician Assistant

## 2024-02-20 ENCOUNTER — Encounter: Payer: Self-pay | Admitting: Physician Assistant

## 2024-02-20 DIAGNOSIS — G4700 Insomnia, unspecified: Secondary | ICD-10-CM

## 2024-02-20 DIAGNOSIS — F319 Bipolar disorder, unspecified: Secondary | ICD-10-CM | POA: Diagnosis not present

## 2024-02-20 DIAGNOSIS — F411 Generalized anxiety disorder: Secondary | ICD-10-CM | POA: Diagnosis not present

## 2024-02-20 MED ORDER — CARIPRAZINE HCL 3 MG PO CAPS: 3.0000 mg | ORAL_CAPSULE | Freq: Every day | ORAL | Status: AC

## 2024-02-20 NOTE — Progress Notes (Signed)
 [facility]  Patient ID: Candace Browning,  MRN: [id number]  PCP: Jolee Madelin Patch, MD  Date of Evaluation: 02/20/2024 Time spent:20 minutes  Chief Complaint:  Chief Complaint   Anxiety; Depression; Follow-up    HISTORY/CURRENT STATUS: HPI For 3 month med check.  States her meds are working well.  Her sleep has been kind of hit or miss lately.  She either sleeps a lot or wakes up often.  Doesn't feel like she's becoming manic though.  Energy and motivation are good.  Work is ok.  Still at [employer] part-time.  No extreme sadness, tearfulness, or feelings of hopelessness.  ADLs and personal hygiene are normal.   Denies any changes in concentration, making decisions, or remembering things.  Appetite has not changed.  Weight is stable.  Anxiety is controlled.  No SI/HI.  No increased talkativeness, racing thoughts, impulsivity or risky behaviors, increased spending, increased libido, grandiosity, increased irritability or anger, paranoia, or hallucinations.  Individual Medical History/ Review of Systems: Changes? :No   Past medications for mental health diagnoses include: BuSpar , Xanax , Valium, Vistaril , Gabapentin unsure if it helped or not  Luvox , Zoloft , Wellbutrin   Depakote did not work at all, Lamictal caused SI, Equetro , Trileptal  at doses higher than 300 mg bid caused severe vomiting and vertigo.  Latuda  caused severe fatigue, Seroquel, Abilify, lithium ,  Geodon ,  Vraylar  has TD at higher doses.  Rexulti , Zyprexa  caused confusion and stuttering,   Artane  no help,  Austedo  at high doses caused extreme brain fog but helped the cough but even at low doses caused tremor in legs.  INGREZZA  CAUSED SWELLING OF FACE AND RASH ALL OVER, NO SOB.  prazosin,  doxazosin,  Allergies: Apple juice, Ingrezza  [valbenazine  tosylate], Coconut (cocos nucifera), Gluten meal, Latex, Nsaids, Other, Peanut-containing drug products, Sulfa antibiotics, Hydrogen peroxide, Penicillins,  and Wheat  Current Medications:  Current Outpatient Medications:    ALPRAZolam  (XANAX ) 0.25 MG tablet, Take 1-2 tablets (0.25-0.5 mg total) by mouth 3 (three) times daily as needed for anxiety., Disp: 90 tablet, Rfl: 1   buPROPion  (WELLBUTRIN  XL) 300 MG 24 hr tablet, TAKE 1 TABLET(300 MG) BY MOUTH DAILY, Disp: 90 tablet, Rfl: 3   busPIRone  (BUSPAR ) 30 MG tablet, Take 1 tablet (30 mg total) by mouth 2 (two) times daily., Disp: 180 tablet, Rfl: 1   Ergocalciferol  (VITAMIN D2 PO), Take by mouth daily., Disp: , Rfl:    Ferrous Sulfate 90 (18 Fe) MG TABS, Take by mouth., Disp: , Rfl:    levocetirizine (XYZAL ) 5 MG tablet, Take 1 tablet once or twice daily as needed, Disp: 60 tablet, Rfl: 5   risperiDONE  (RISPERDAL ) 1 MG tablet, Take 1 tablet (1 mg total) by mouth at bedtime., Disp: 90 tablet, Rfl: 1   triamcinolone  (NASACORT  ALLERGY  24HR) 55 MCG/ACT AERO nasal inhaler, Place 2 sprays into the nose daily as needed., Disp: , Rfl:    valACYclovir  (VALTREX ) 500 MG tablet, Take 1 tablet (500 mg total) by mouth 2 (two) times daily as needed (outbreaks)., Disp: 30 tablet, Rfl: 0   albuterol  (VENTOLIN  HFA) 108 (90 Base) MCG/ACT inhaler, Inhale 2 puffs into the lungs every 6 (six) hours as needed. (Patient not taking: Reported on 11/20/2023), Disp: 18 g, Rfl: 1   Albuterol -Budesonide  (AIRSUPRA ) 90-80 MCG/ACT AERO, Inhale 2 puffs into the lungs as needed (every four to six hours for cough, wheeze, shortness of breath.  Rinse, gargle, and spit after use). (Patient not taking: Reported on 11/20/2023), Disp: 10.7 g, Rfl: 1  cariprazine  (VRAYLAR ) 3 MG capsule, Take 1 capsule (3 mg total) by mouth daily., Disp: , Rfl:    EPINEPHrine  0.3 mg/0.3 mL IJ SOAJ injection, Use as directed for life-threatening allergic reaction., Disp: 2 each, Rfl: 3 Medication Side Effects: none  Family Medical/ Social History: Changes? No  MENTAL HEALTH EXAM:  There were no vitals taken for this visit.There is no height or weight on file  to calculate BMI.  General Appearance: Casual and Well Groomed  Eye Contact:  Good  Speech:  Clear and Coherent and Normal Rate  Volume:  Normal  Mood:  Euthymic  Affect:  Congruent  Thought Process:  Goal Directed and Descriptions of Associations: Circumstantial  Orientation:  Full (Time, Place, and Person)  Thought Content: Logical   Suicidal Thoughts:  No  Homicidal Thoughts:  No  Memory:  WNL  Judgement:  Good  Insight:  Good  Psychomotor Activity:  Normal  Concentration:  Concentration: Good and Attention Span: Good  Recall:  Good  Fund of Knowledge: Good  Language: Good  Assets:  Communication Skills Desire for Improvement Financial Resources/Insurance Housing Leisure Time Physical Health Resilience Transportation Vocational/Educational  ADL's:  Intact  Cognition: WNL  Prognosis:  Good   PCP follows labs  DIAGNOSES:    ICD-10-CM   1. Bipolar I disorder (HCC)  F31.9     2. Generalized anxiety disorder  F41.1     3. Insomnia, unspecified type  G47.00       Receiving Psychotherapy: Yes   with Randine Maxin.  RECOMMENDATIONS:  PDMP was reviewed.  Last Xanax  filled 11/20/2023. I provided approximately 20 minutes of face to face time during this encounter, including time spent before and after the visit in records review, medical decision making, counseling pertinent to today's visit, and charting.   She is doing well as far as her medications goes so no changes will be made.  Continue Xanax   0.25 mg, 2-3, 3 times daily as needed anxiety. Continue Wellbutrin  XL 300 mg, 1 p.o. every morning.   Continue BuSpar  30 mg, 1 p.o. twice daily. Continue Risperdal  1 mg, at bedtime.  Continue Vraylar  3 mg, 1 p.o. daily.  Continue therapy with Randine Maxin. Return in 3 months.  Verneita Cooks, PA-C

## 2024-02-27 ENCOUNTER — Ambulatory Visit (INDEPENDENT_AMBULATORY_CARE_PROVIDER_SITE_OTHER)

## 2024-02-27 DIAGNOSIS — J309 Allergic rhinitis, unspecified: Secondary | ICD-10-CM

## 2024-02-27 DIAGNOSIS — J302 Other seasonal allergic rhinitis: Secondary | ICD-10-CM | POA: Diagnosis not present

## 2024-03-10 ENCOUNTER — Ambulatory Visit: Admitting: *Deleted

## 2024-03-10 DIAGNOSIS — J3089 Other allergic rhinitis: Secondary | ICD-10-CM | POA: Diagnosis not present

## 2024-03-10 DIAGNOSIS — J309 Allergic rhinitis, unspecified: Secondary | ICD-10-CM | POA: Diagnosis not present

## 2024-03-23 ENCOUNTER — Other Ambulatory Visit: Payer: Self-pay | Admitting: Physician Assistant

## 2024-03-31 ENCOUNTER — Ambulatory Visit: Admitting: Allergy and Immunology

## 2024-04-10 ENCOUNTER — Ambulatory Visit: Admitting: *Deleted

## 2024-04-10 DIAGNOSIS — J302 Other seasonal allergic rhinitis: Secondary | ICD-10-CM

## 2024-04-21 ENCOUNTER — Other Ambulatory Visit: Payer: Self-pay | Admitting: Physician Assistant

## 2024-04-22 NOTE — Telephone Encounter (Signed)
 LVM to [facility] - need to verify pharmacy

## 2024-04-23 ENCOUNTER — Ambulatory Visit: Admitting: Allergy and Immunology

## 2024-04-23 ENCOUNTER — Encounter: Payer: Self-pay | Admitting: Allergy and Immunology

## 2024-04-23 VITALS — BP 104/78 | HR 76 | Temp 98.4°F | Resp 14 | Ht 66.0 in | Wt 156.0 lb

## 2024-04-23 DIAGNOSIS — K219 Gastro-esophageal reflux disease without esophagitis: Secondary | ICD-10-CM

## 2024-04-23 DIAGNOSIS — J3089 Other allergic rhinitis: Secondary | ICD-10-CM | POA: Diagnosis not present

## 2024-04-23 DIAGNOSIS — J4520 Mild intermittent asthma, uncomplicated: Secondary | ICD-10-CM | POA: Diagnosis not present

## 2024-04-23 DIAGNOSIS — J301 Allergic rhinitis due to pollen: Secondary | ICD-10-CM

## 2024-04-23 MED ORDER — ALBUTEROL SULFATE HFA 108 (90 BASE) MCG/ACT IN AERS: INHALATION_SPRAY | RESPIRATORY_TRACT | 1 refills | Status: AC

## 2024-04-23 MED ORDER — EPINEPHRINE 0.3 MG/0.3ML IJ SOAJ: INTRAMUSCULAR | 3 refills | Status: AC

## 2024-04-23 MED ORDER — FAMOTIDINE 40 MG PO TABS: 40.0000 mg | ORAL_TABLET | Freq: Every day | ORAL | 5 refills | Status: AC

## 2024-04-23 NOTE — Patient Instructions (Addendum)
" ° °  1.  Allergen avoidance measures  - dust mite, animal, pollen, molds, gluten.    2. Continue to treat and prevent inflammation with the following:   A. Immunotherapy  3. Treat and prevent reflux (and cough):   A. Continue pantoprazole 40 mg - 2 times per day  B. Start famotidine  40 mg - 1 time per day in evening  C. Minimize chocolate / caffeine consumption  3.  Continue the following if needed:   A.  Xyzal  5 mg - 1 tablet 1 time per day  B.  EpiPen   C.  Albuterol  -2 inhalations every 4-6 hours  D.  OTC Nasacort  - 1 spray each nostril 1-2 times per day  4. Return to clinic in 6 months or earlier if problem  5. Influenza = Tamiflu. Covid = Paxlovid  "

## 2024-04-23 NOTE — Progress Notes (Unsigned)
 "  [place] [facility] [place] [facility] [place] [facility] [place] [facility] [place]   Follow-up Note  Referring Provider: Jolee Madelin Patch, MD Primary Provider: Jolee Madelin Patch, MD Date of Office Visit: 04/23/2024  Subjective:   Candace Browning (DOB: 03/02/1981) is a 44 y.o. female who [facility] in re-evaluation of the following:  HPI: Candace Browning returns to this clinic in evaluation of asthma, allergic rhinitis, possible celiac disease, and LPR.  I last saw her in this clinic 06 June 2023.  Overall she has really done well with her airway and has not required a systemic steroid or an antibiotic for any type of airway issue and has a very rare requirement for short acting bronchodilator and can exercise without any difficulty and can go through each season of the year with very little issues revolving around her upper or lower airway.  She continues on immunotherapy currently at every 4 weeks.  She will occasionally use an antihistamine and and rarely a nasal steroid.  But she has developed some cough recently.  This appears to occur mostly at night after she lays down.  This correlates with really bad reflux and regurgitation that is occurring even while she uses pantoprazole twice a day.  She has dark chocolate most days of the week.  She does not have any chest tightness or shortness of breath or other respiratory tract symptoms.  She had an upper endoscopy in investigation of celiac disease and apparently the biopsy was negative for celiac disease but she obviously does much better without consumption of gluten and she is gluten free at this point.  Allergies as of 04/23/2024       Reactions   Apple Juice Anaphylaxis   Ingrezza  [valbenazine  Tosylate] Swelling   Swelling of face, rash all over, no SOB   Coconut (cocos Nucifera)    Gluten Meal    All breads   Latex    Nsaids Other (See Comments)   Thin basement membrane disease, advised to avoid NSAIDS    Other    PT HAD FOOD ALLERGIES:APPLES, WHEAT, AND all nuts   Peanut-containing Drug Products    Sulfa Antibiotics    Hydrogen Peroxide Itching, Rash   Penicillins Rash   Has patient had a PCN reaction causing immediate rash, facial/tongue/throat swelling, SOB or lightheadedness with hypotension: yes  Has patient had a PCN reaction causing severe rash involving mucus membranes or skin necrosis: no Has patient had a PCN reaction that required hospitalization: no  Has patient had a PCN reaction occurring within the last 10 years: no If all of the above answers are NO, then may proceed with Cephalosporin use.   Wheat Rash        Medication List    albuterol  108 (90 Base) MCG/ACT inhaler Commonly known as: VENTOLIN  HFA Inhale 2 puffs into the lungs every 6 (six) hours as needed.   ALPRAZolam  0.25 MG tablet Commonly known as: XANAX  Take 1-2 tablets (0.25-0.5 mg total) by mouth 3 (three) times daily as needed for anxiety.   buPROPion  300 MG 24 hr tablet Commonly known as: WELLBUTRIN  XL TAKE 1 TABLET(300 MG) BY MOUTH DAILY   busPIRone  30 MG tablet Commonly known as: BUSPAR  TAKE 1 TABLET(30 MG) BY MOUTH TWICE DAILY   cariprazine  3 MG capsule Commonly known as: Vraylar  Take 1 capsule (3 mg total) by mouth daily.   clobetasol ointment 0.05 % Commonly known as: TEMOVATE Apply topically.   EPINEPHrine  0.3 mg/0.3 mL  Soaj injection Commonly known as: EPI-PEN Use as directed for life-threatening allergic reaction.   Ferrous Sulfate 90 (18 Fe) MG Tabs Take by mouth.   levocetirizine 5 MG tablet Commonly known as: XYZAL  Take 1 tablet once or twice daily as needed   pantoprazole 40 MG tablet Commonly known as: PROTONIX   risperiDONE  1 MG tablet Commonly known as: RISPERDAL  TAKE 1 TABLET(1 MG) BY MOUTH AT BEDTIME   valACYclovir  500 MG tablet Commonly known as: VALTREX  Take 1 tablet (500 mg total) by mouth 2 (two) times daily as needed (outbreaks).   VITAMIN D2 PO Take  by mouth daily.    Past Medical History:  Diagnosis Date   Allergic rhinitis    Anemia    Anxiety    Asthma    Atopic dermatitis    Bipolar 1 disorder (HCC)    Bipolar 1 disorder, mixed, mild (HCC) 03/29/2017   Eczema    Food allergy     Gluten intolerance    Herpes simplex without mention of complication    HSV2   Renal disorder     Past Surgical History:  Procedure Laterality Date   WISDOM TOOTH EXTRACTION      Review of systems negative except as noted in HPI / PMHx or noted below:  Review of Systems  Constitutional: Negative.   HENT: Negative.    Eyes: Negative.   Respiratory: Negative.    Cardiovascular: Negative.   Gastrointestinal: Negative.   Genitourinary: Negative.   Musculoskeletal: Negative.   Skin: Negative.   Neurological: Negative.   Endo/Heme/Allergies: Negative.   Psychiatric/Behavioral: Negative.       Objective:   Vitals:   04/23/24 1346  BP: 104/78  Pulse: 76  Resp: 14  Temp: 98.4 F (36.9 C)  SpO2: 100%   Height: 5' 6 (167.6 cm)  Weight: 156 lb (70.8 kg)   Physical Exam Constitutional:      Appearance: She is not diaphoretic.  HENT:     Head: Normocephalic.     Right Ear: Tympanic membrane, ear canal and external ear normal.     Left Ear: Tympanic membrane, ear canal and external ear normal.     Nose: Nose normal. No mucosal edema or rhinorrhea.     Mouth/Throat:     Pharynx: Uvula midline. No oropharyngeal exudate.  Eyes:     Conjunctiva/sclera: Conjunctivae normal.  Neck:     Thyroid : No thyromegaly.     Trachea: Trachea normal. No tracheal tenderness or tracheal deviation.  Cardiovascular:     Rate and Rhythm: Normal rate and regular rhythm.     Heart sounds: Normal heart sounds, S1 normal and S2 normal. No murmur heard. Pulmonary:     Effort: No respiratory distress.     Breath sounds: Normal breath sounds. No stridor. No wheezing or rales.  Lymphadenopathy:     Head:     Right side of head: No tonsillar  adenopathy.     Left side of head: No tonsillar adenopathy.     Cervical: No cervical adenopathy.  Skin:    Findings: No erythema or rash.     Nails: There is no clubbing.  Neurological:     Mental Status: She is alert.     Diagnostics: Spirometry was performed and demonstrated an FEV1 of 2.52 at 94 % of predicted.  Assessment and Plan:   1. Asthma, mild intermittent, well-controlled   2. Perennial allergic rhinitis   3. Seasonal allergic rhinitis due to pollen   4. LPRD (laryngopharyngeal reflux  disease)    1.  Allergen avoidance measures  - dust mite, animal, pollen, molds, gluten.    2. Continue to treat and prevent inflammation with the following:   A. Immunotherapy  3. Treat and prevent reflux (and cough):   A. Continue pantoprazole 40 mg - 2 times per day  B. Start famotidine  40 mg - 1 time per day in evening  C. Minimize chocolate / caffeine consumption  3.  Continue the following if needed:   A.  Xyzal  5 mg - 1 tablet 1 time per day  B.  EpiPen   C.  Albuterol  -2 inhalations every 4-6 hours  D.  OTC Nasacort  - 1 spray each nostril 1-2 times per day  4. Return to clinic in 6 months or earlier if problem  5. Influenza = Tamiflu. Covid = Paxlovid  Zeniah is doing very well at this point in time regarding her airway issue although she does have some cough and this cough appears to correlate with increased reflux and regurgitation and only appears to occur at nighttime.  She has no other associated respiratory tract symptoms and her spirometry is normal.  I suspect that she is having a reflux induced cough and will increase her therapy for reflux by having her start famotidine  in addition to her pantoprazole twice a day and minimizing her daily chocolate consumption.  She will continue on immunotherapy to address her atopic respiratory disease and she has a selection of agents to be utilized should they be required.  If she does well with the plan noted above we will see  her back in this clinic in 6 months or earlier if there is a problem.  Camellia Denis, MD Allergy  / Immunology [facility] "

## 2024-04-24 ENCOUNTER — Encounter: Payer: Self-pay | Admitting: Allergy and Immunology

## 2024-05-21 ENCOUNTER — Ambulatory Visit: Admitting: Physician Assistant
